# Patient Record
Sex: Male | Born: 1940 | Race: White | Hispanic: No | Marital: Married | Smoking: Former smoker
Health system: Southern US, Community
[De-identification: ages and names within clinical notes are randomized; demographics above are authoritative.]

## PROBLEM LIST (undated history)

## (undated) DIAGNOSIS — I10 Essential (primary) hypertension: Secondary | ICD-10-CM

## (undated) DIAGNOSIS — N179 Acute kidney failure, unspecified: Secondary | ICD-10-CM

## (undated) HISTORY — PX: TONSILLECTOMY: SUR1361

## (undated) SURGERY — COLONOSCOPY
Anesthesia: Moderate Sedation

---

## 2012-02-20 DIAGNOSIS — R112 Nausea with vomiting, unspecified: Secondary | ICD-10-CM

## 2014-02-15 ENCOUNTER — Emergency Department (HOSPITAL_COMMUNITY): Payer: Medicare Other

## 2014-02-15 ENCOUNTER — Encounter (HOSPITAL_COMMUNITY): Payer: Self-pay | Admitting: Emergency Medicine

## 2014-02-15 ENCOUNTER — Inpatient Hospital Stay (HOSPITAL_COMMUNITY)
Admission: EM | Admit: 2014-02-15 | Discharge: 2014-03-09 | DRG: 441 | Disposition: A | Discharge status: Skilled Nursing Facility | Payer: Medicare Other | Attending: Internal Medicine | Admitting: Internal Medicine

## 2014-02-15 DIAGNOSIS — R350 Frequency of micturition: Secondary | ICD-10-CM | POA: Diagnosis present

## 2014-02-15 DIAGNOSIS — N184 Chronic kidney disease, stage 4 (severe): Secondary | ICD-10-CM | POA: Diagnosis present

## 2014-02-15 DIAGNOSIS — R001 Bradycardia, unspecified: Secondary | ICD-10-CM | POA: Diagnosis not present

## 2014-02-15 DIAGNOSIS — Z79899 Other long term (current) drug therapy: Secondary | ICD-10-CM | POA: Diagnosis not present

## 2014-02-15 DIAGNOSIS — N281 Cyst of kidney, acquired: Secondary | ICD-10-CM | POA: Diagnosis present

## 2014-02-15 DIAGNOSIS — E43 Unspecified severe protein-calorie malnutrition: Secondary | ICD-10-CM

## 2014-02-15 DIAGNOSIS — D638 Anemia in other chronic diseases classified elsewhere: Secondary | ICD-10-CM | POA: Diagnosis present

## 2014-02-15 DIAGNOSIS — E869 Volume depletion, unspecified: Secondary | ICD-10-CM | POA: Diagnosis present

## 2014-02-15 DIAGNOSIS — C189 Malignant neoplasm of colon, unspecified: Secondary | ICD-10-CM

## 2014-02-15 DIAGNOSIS — I129 Hypertensive chronic kidney disease with stage 1 through stage 4 chronic kidney disease, or unspecified chronic kidney disease: Secondary | ICD-10-CM | POA: Diagnosis present

## 2014-02-15 DIAGNOSIS — R300 Dysuria: Secondary | ICD-10-CM | POA: Diagnosis present

## 2014-02-15 DIAGNOSIS — Z87891 Personal history of nicotine dependence: Secondary | ICD-10-CM

## 2014-02-15 DIAGNOSIS — Z6824 Body mass index (BMI) 24.0-24.9, adult: Secondary | ICD-10-CM | POA: Diagnosis not present

## 2014-02-15 DIAGNOSIS — K8020 Calculus of gallbladder without cholecystitis without obstruction: Secondary | ICD-10-CM | POA: Diagnosis present

## 2014-02-15 DIAGNOSIS — N400 Enlarged prostate without lower urinary tract symptoms: Secondary | ICD-10-CM | POA: Diagnosis present

## 2014-02-15 DIAGNOSIS — N401 Enlarged prostate with lower urinary tract symptoms: Secondary | ICD-10-CM | POA: Diagnosis present

## 2014-02-15 DIAGNOSIS — K6389 Other specified diseases of intestine: Secondary | ICD-10-CM | POA: Diagnosis present

## 2014-02-15 DIAGNOSIS — K921 Melena: Secondary | ICD-10-CM | POA: Diagnosis present

## 2014-02-15 DIAGNOSIS — G9340 Encephalopathy, unspecified: Secondary | ICD-10-CM | POA: Diagnosis present

## 2014-02-15 DIAGNOSIS — R319 Hematuria, unspecified: Secondary | ICD-10-CM | POA: Diagnosis not present

## 2014-02-15 DIAGNOSIS — R627 Adult failure to thrive: Secondary | ICD-10-CM | POA: Diagnosis present

## 2014-02-15 DIAGNOSIS — I10 Essential (primary) hypertension: Secondary | ICD-10-CM | POA: Diagnosis present

## 2014-02-15 DIAGNOSIS — N179 Acute kidney failure, unspecified: Secondary | ICD-10-CM | POA: Diagnosis present

## 2014-02-15 DIAGNOSIS — K7200 Acute and subacute hepatic failure without coma: Secondary | ICD-10-CM

## 2014-02-15 DIAGNOSIS — D509 Iron deficiency anemia, unspecified: Secondary | ICD-10-CM | POA: Diagnosis present

## 2014-02-15 DIAGNOSIS — K750 Abscess of liver: Principal | ICD-10-CM | POA: Diagnosis present

## 2014-02-15 DIAGNOSIS — D649 Anemia, unspecified: Secondary | ICD-10-CM | POA: Diagnosis present

## 2014-02-15 DIAGNOSIS — K769 Liver disease, unspecified: Secondary | ICD-10-CM

## 2014-02-15 DIAGNOSIS — R410 Disorientation, unspecified: Secondary | ICD-10-CM

## 2014-02-15 DIAGNOSIS — R110 Nausea: Secondary | ICD-10-CM

## 2014-02-15 DIAGNOSIS — R5381 Other malaise: Secondary | ICD-10-CM

## 2014-02-15 HISTORY — DX: Essential (primary) hypertension: I10

## 2014-02-15 HISTORY — DX: Acute kidney failure, unspecified: N17.9

## 2014-02-15 LAB — PROTIME-INR
INR: 1.24 (ref 0.00–1.49)
INR: 1.18 (ref 0.00–1.49)
PROTHROMBIN TIME: 15.6 s — AB (ref 11.6–15.2)
Prothrombin Time: 15 seconds (ref 11.6–15.2)

## 2014-02-15 LAB — CBC WITH DIFFERENTIAL/PLATELET
BASOS ABS: 0 10*3/uL (ref 0.0–0.1)
Basophils Relative: 0 % (ref 0–1)
EOS ABS: 0.1 10*3/uL (ref 0.0–0.7)
EOS PCT: 0 % (ref 0–5)
HEMATOCRIT: 26.1 % — AB (ref 39.0–52.0)
HEMOGLOBIN: 8.8 g/dL — AB (ref 13.0–17.0)
Lymphocytes Relative: 9 % — ABNORMAL LOW (ref 12–46)
Lymphs Abs: 1.7 10*3/uL (ref 0.7–4.0)
MCH: 26.1 pg (ref 26.0–34.0)
MCHC: 33.7 g/dL (ref 30.0–36.0)
MCV: 77.4 fL — ABNORMAL LOW (ref 78.0–100.0)
MONO ABS: 1 10*3/uL (ref 0.1–1.0)
MONOS PCT: 6 % (ref 3–12)
NEUTROS ABS: 15.1 10*3/uL — AB (ref 1.7–7.7)
Neutrophils Relative %: 85 % — ABNORMAL HIGH (ref 43–77)
Platelets: 302 10*3/uL (ref 150–400)
RBC: 3.37 MIL/uL — ABNORMAL LOW (ref 4.22–5.81)
RDW: 14.7 % (ref 11.5–15.5)
WBC: 17.8 10*3/uL — ABNORMAL HIGH (ref 4.0–10.5)

## 2014-02-15 LAB — COMPREHENSIVE METABOLIC PANEL
ALBUMIN: 2.1 g/dL — AB (ref 3.5–5.2)
ALT: 31 U/L (ref 0–53)
ANION GAP: 14 (ref 5–15)
AST: 42 U/L — AB (ref 0–37)
Alkaline Phosphatase: 209 U/L — ABNORMAL HIGH (ref 39–117)
BILIRUBIN TOTAL: 0.7 mg/dL (ref 0.3–1.2)
BUN: 18 mg/dL (ref 6–23)
CALCIUM: 8.7 mg/dL (ref 8.4–10.5)
CHLORIDE: 95 meq/L — AB (ref 96–112)
CO2: 26 mEq/L (ref 19–32)
Creatinine, Ser: 1.09 mg/dL (ref 0.50–1.35)
GFR calc Af Amer: 75 mL/min — ABNORMAL LOW (ref >90)
GFR calc non Af Amer: 65 mL/min — ABNORMAL LOW (ref >90)
Glucose, Bld: 104 mg/dL — ABNORMAL HIGH (ref 70–99)
Potassium: 3.3 mEq/L — ABNORMAL LOW (ref 3.7–5.3)
Sodium: 135 mEq/L — ABNORMAL LOW (ref 137–147)
TOTAL PROTEIN: 6.3 g/dL (ref 6.0–8.3)

## 2014-02-15 LAB — APTT: aPTT: 35 seconds (ref 24–37)

## 2014-02-15 MED ORDER — POTASSIUM CHLORIDE IN NACL 20-0.9 MEQ/L-% IV SOLN
INTRAVENOUS | Status: AC
  Administered 2014-02-15: 16:00:00 via INTRAVENOUS
  Filled 2014-02-15 (×2): qty 1000

## 2014-02-15 MED ORDER — GUAIFENESIN-DM 100-10 MG/5ML PO SYRP: 5.0000 mL | ORAL_SOLUTION | ORAL | Status: DC | PRN

## 2014-02-15 MED ORDER — POLYETHYLENE GLYCOL 3350 17 G PO PACK
17.0000 g | PACK | Freq: Every day | ORAL | Status: DC | PRN
  Filled 2014-02-15: qty 1

## 2014-02-15 MED ORDER — INFLUENZA VAC SPLIT QUAD 0.5 ML IM SUSY
0.5000 mL | PREFILLED_SYRINGE | INTRAMUSCULAR | Status: DC
  Filled 2014-02-15: qty 0.5

## 2014-02-15 MED ORDER — HEPARIN SODIUM (PORCINE) 5000 UNIT/ML IJ SOLN
5000.0000 [IU] | Freq: Three times a day (TID) | INTRAMUSCULAR | Status: DC
  Administered 2014-02-15 – 2014-02-16 (×2): 5000 [IU] via SUBCUTANEOUS
  Filled 2014-02-15 (×3): qty 1

## 2014-02-15 MED ORDER — MORPHINE SULFATE 2 MG/ML IJ SOLN
2.0000 mg | INTRAMUSCULAR | Status: DC | PRN
  Administered 2014-02-16 (×2): 2 mg via INTRAVENOUS
  Filled 2014-02-15 (×2): qty 1

## 2014-02-15 MED ORDER — PNEUMOCOCCAL VAC POLYVALENT 25 MCG/0.5ML IJ INJ
0.5000 mL | INJECTION | INTRAMUSCULAR | Status: DC
  Filled 2014-02-15: qty 0.5

## 2014-02-15 MED ORDER — TERAZOSIN HCL 5 MG PO CAPS
5.0000 mg | ORAL_CAPSULE | Freq: Every day | ORAL | Status: DC
  Administered 2014-02-15 – 2014-03-08 (×22): 5 mg via ORAL
  Filled 2014-02-15 (×22): qty 1

## 2014-02-15 MED ORDER — ONDANSETRON HCL 4 MG/2ML IJ SOLN
4.0000 mg | Freq: Four times a day (QID) | INTRAMUSCULAR | Status: DC | PRN
  Administered 2014-02-25: 4 mg via INTRAVENOUS
  Filled 2014-02-15: qty 2

## 2014-02-15 MED ORDER — PIPERACILLIN-TAZOBACTAM 3.375 G IVPB 30 MIN
3.3750 g | Freq: Once | INTRAVENOUS | Status: AC
  Administered 2014-02-15: 3.375 g via INTRAVENOUS
  Filled 2014-02-15 (×2): qty 50

## 2014-02-15 MED ORDER — HYDROCODONE-ACETAMINOPHEN 5-325 MG PO TABS
1.0000 | ORAL_TABLET | ORAL | Status: DC | PRN
  Administered 2014-02-15 – 2014-02-18 (×6): 2 via ORAL
  Administered 2014-02-19 (×2): 1 via ORAL
  Administered 2014-02-19: 2 via ORAL
  Filled 2014-02-15 (×3): qty 2
  Filled 2014-02-15: qty 1
  Filled 2014-02-15 (×5): qty 2

## 2014-02-15 MED ORDER — POTASSIUM CHLORIDE CRYS ER 20 MEQ PO TBCR
40.0000 meq | EXTENDED_RELEASE_TABLET | Freq: Once | ORAL | Status: AC
  Administered 2014-02-15: 40 meq via ORAL
  Filled 2014-02-15: qty 2

## 2014-02-15 MED ORDER — MORPHINE SULFATE 2 MG/ML IJ SOLN
2.0000 mg | INTRAMUSCULAR | Status: DC | PRN
  Administered 2014-02-15 (×2): 2 mg via INTRAVENOUS
  Filled 2014-02-15 (×2): qty 1

## 2014-02-15 MED ORDER — FENTANYL CITRATE 0.05 MG/ML IJ SOLN
100.0000 ug | Freq: Once | INTRAMUSCULAR | Status: AC
  Administered 2014-02-15: 100 ug via INTRAVENOUS
  Filled 2014-02-15: qty 2

## 2014-02-15 MED ORDER — VANCOMYCIN HCL IN DEXTROSE 1-5 GM/200ML-% IV SOLN
1000.0000 mg | Freq: Two times a day (BID) | INTRAVENOUS | Status: DC
Start: 2014-02-15 — End: 2014-02-18
  Administered 2014-02-15 – 2014-02-18 (×6): 1000 mg via INTRAVENOUS
  Filled 2014-02-15 (×7): qty 200

## 2014-02-15 MED ORDER — PIPERACILLIN-TAZOBACTAM 3.375 G IVPB
3.3750 g | Freq: Three times a day (TID) | INTRAVENOUS | Status: DC
  Administered 2014-02-15 – 2014-02-26 (×32): 3.375 g via INTRAVENOUS
  Filled 2014-02-15 (×36): qty 50

## 2014-02-15 MED ORDER — ONDANSETRON HCL 4 MG PO TABS: 4.0000 mg | ORAL_TABLET | Freq: Four times a day (QID) | ORAL | Status: DC | PRN

## 2014-02-15 NOTE — ED Provider Notes (Signed)
CSN: [id number]     Arrival date & time 02/15/14  1138 History   First MD Initiated Contact with Patient 02/15/14 1227     Chief Complaint  Patient presents with  . Urinary Frequency  . Generalized Body Aches     (Consider location/radiation/quality/duration/timing/severity/associated sxs/prior Treatment) Patient is a 73 y.o. male presenting with frequency. The history is provided by the patient.  Urinary Frequency Associated symptoms include abdominal pain. Pertinent negatives include no chest pain, no headaches and no shortness of breath.   patient was sent to the hospital after MRI results. He's had fevers and chills and weakness along with upper abdominal pain. He started out with URI symptoms such as runny nose and cough around a week ago. He saw his PCP and then was transferred to [facility] after an elevated white count. Had been on antibiotics and then had ultrasound which some nonspecific findings. Urinalysis showed possible infection, however cultures were negative. Blood cultures are negative. Patient had an outpatient MRI done to evaluate the liver and shows liver abscess versus metastatic liver cancer with a colonic mass. His continued to be weak. His pain in his abdomen. Fevers up to 102 at home. He had been on Levaquin and initially vancomycin at [facility] Past Medical History  Diagnosis Date  . Hypertension   . Kidney stone    Past Surgical History  Procedure Laterality Date  . Tonsillectomy     No family history on file. History  Substance Use Topics  . Smoking status: Former Research scientist (life sciences)  . Smokeless tobacco: Not on file  . Alcohol Use: No    Review of Systems  Constitutional: Positive for fever, chills and fatigue. Negative for activity change and appetite change.  HENT: Positive for sore throat.   Eyes: Negative for pain.  Respiratory: Negative for chest tightness and shortness of breath.   Cardiovascular: Negative for chest pain and leg swelling.   Gastrointestinal: Positive for abdominal pain. Negative for nausea, vomiting and diarrhea.  Genitourinary: Positive for frequency. Negative for flank pain.  Musculoskeletal: Negative for back pain and neck stiffness.  Skin: Negative for rash.  Neurological: Negative for weakness, numbness and headaches.  Psychiatric/Behavioral: Negative for behavioral problems.      Allergies  Review of patient's allergies indicates no known allergies.  Home Medications   Prior to Admission medications   Medication Sig Start Date End Date Taking? Authorizing Provider  hydrochlorothiazide (HYDRODIURIL) 25 MG tablet Take 25 mg by mouth daily.   Yes Historical Provider, MD  potassium chloride SA (K-DUR,KLOR-CON) 20 MEQ tablet Take 20 mEq by mouth daily.  02/09/14  Yes Historical Provider, MD  ramipril (ALTACE) 10 MG capsule Take 10 mg by mouth daily.  01/29/14  Yes Historical Provider, MD  terazosin (HYTRIN) 5 MG capsule Take 5 mg by mouth daily.  01/29/14  Yes Historical Provider, MD   BP 123/59  Pulse 65  Temp(Src) 98.7 F (37.1 C) (Oral)  Resp 21  Ht 6' (1.829 m)  Wt 185 lb (83.915 kg)  BMI 25.08 kg/m2  SpO2 91% Physical Exam  Constitutional: He is oriented to person, place, and time. He appears well-developed and well-nourished.  HENT:  Head: Normocephalic.  Eyes: Pupils are equal, round, and reactive to light. Scleral icterus is present.  Cardiovascular: Normal rate and regular rhythm.   Pulmonary/Chest: Effort normal.  Abdominal: There is tenderness.  Mild distention. Tenderness right upper quadrant with some fullness.  Musculoskeletal: He exhibits edema.  Mild bilateral low lower strandy pitting  edema  Neurological: He is alert and oriented to person, place, and time.    ED Course  Procedures (including critical care time) Labs Review Labs Reviewed  CBC WITH DIFFERENTIAL - Abnormal; Notable for the following:    WBC 17.8 (*)    RBC 3.37 (*)    Hemoglobin 8.8 (*)    HCT 26.1 (*)     MCV 77.4 (*)    Neutrophils Relative % 85 (*)    Neutro Abs 15.1 (*)    Lymphocytes Relative 9 (*)    All other components within normal limits  COMPREHENSIVE METABOLIC PANEL - Abnormal; Notable for the following:    Sodium 135 (*)    Potassium 3.3 (*)    Chloride 95 (*)    Glucose, Bld 104 (*)    Albumin 2.1 (*)    AST 42 (*)    Alkaline Phosphatase 209 (*)    GFR calc non Af Amer 65 (*)    GFR calc Af Amer 75 (*)    All other components within normal limits  PROTIME-INR - Abnormal; Notable for the following:    Prothrombin Time 15.6 (*)    All other components within normal limits  CULTURE, BLOOD (ROUTINE X 2)  CULTURE, BLOOD (ROUTINE X 2)  CULTURE, EXPECTORATED SPUTUM-ASSESSMENT  GRAM STAIN  BODY FLUID CULTURE  APTT  PROTIME-INR  BODY FLUID CELL COUNT WITH DIFFERENTIAL  CANCER ANTIGEN 19-9  CEA  CYTOLOGY - NON PAP    Imaging Review Dg Chest 2 View  02/15/2014   CLINICAL DATA:  Fever and fatigue, and possible liver abscess ; also body aches and urinary frequency and right lower quadrant abdominal pain for 1 month.  EXAM: CHEST  2 VIEW  COMPARISON:  PA and lateral chest x-ray of February 09, 2014 and February 06, 2014.  FINDINGS: The lungs are adequately inflated. The interstitial markings are mildly increased. There is a new tiny left pleural effusion. The cardiac silhouette is normal in size. The pulmonary vascularity is mildly prominent. There is persistent increased density in the retrocardiac region likely on the right which may reflect developing pneumonia. There is no pneumothorax. The mediastinum is normal in width. The bony thorax is unremarkable.  IMPRESSION: Mildly increased interstitial markings diffusely, and focally increased lung markings likely in the right lower lobe are noted. One cannot exclude early pneumonia. A small left pleural effusion has developed.   Electronically Signed   By: David  Martinique   On: 02/15/2014 13:02     EKG Interpretation None       MDM   Final diagnoses:  Liver, massive necrosis  BPH (benign prostatic hyperplasia)  Colonic mass  Essential hypertension  Liver abscess    Patient with fevers of unknown origin, liver mass and colonic mass. Question of liver abscess versus infected necrotic metastatic tumor. Had outpatient MRI. Will admit to internal medicine. General surgery has also been notified    Jasper Riling. Alvino Chapel, MD 02/15/14 1610

## 2014-02-15 NOTE — ED Notes (Signed)
Phlebotomy at bedside.

## 2014-02-15 NOTE — ED Notes (Signed)
General surgery at bedside. 

## 2014-02-15 NOTE — Consult Note (Signed)
Seen and agree  

## 2014-02-15 NOTE — ED Notes (Addendum)
Hospitalist at bedside 

## 2014-02-15 NOTE — H&P (Signed)
Patient Demographics  Brent Little, is a 73 y.o. male  MRN: [id number]   DOB - [birth year]  Admit Date - 02/15/2014  Outpatient Primary MD for the patient is [name], MD   With History of -  Past Medical History  Diagnosis Date  . Hypertension   . Kidney stone       Past Surgical History  Procedure Laterality Date  . Tonsillectomy      in for   Chief Complaint  Patient presents with  . Urinary Frequency  . Generalized Body Aches     HPI  Brent Little  is a 73 y.o. male,  With H/O HTN, Kidney stone, who initially presented to [place] about one week ago with chief complaints of fever, dull right upper quadrant pain and a dry cough. He was diagnosed there with a possible community-acquired pneumonia, was placed on Levaquin, he had marginal improvement. During his fever workup he had a right upper quadrant ultrasound which showed some nonspecific changes . He was then discharged home with outpatient MRI, MRI done in the outpatient setting showed a septated cystic lesion in the right hepatic lobe concerning for abscess versus metastatic necrotic mass. MRI also showed an ascending colon circumferential mass.   He continued to have fever chills, decreased appetite and lethargy for which he presented to on ER, house call to admit the patient for workup of his necrotic liver mass, colonic mass and possible chest x-ray infiltrate.    Review of Systems    In addition to the HPI above,   + Fever-chills, No Headache, No changes with Vision or hearing, No problems swallowing food or Liquids, No Chest pain, +ve dry Cough, no Shortness of Breath, +ve RUQ Abdominal pain, No Nausea or Vommitting, Bowel movements are regular, No Blood in stool or Urine, No dysuria, No new skin rashes or bruises, No new  joints pains-aches,  No new weakness, tingling, numbness in any extremity, No recent weight gain or loss, No polyuria, polydypsia or polyphagia, No significant Mental Stressors.  A full 10 point Review of Systems was done, except as stated above, all other Review of Systems were negative.   Social History History  Substance Use Topics  . Smoking status: Former Research scientist (life sciences)  . Smokeless tobacco: Not on file  . Alcohol Use: No      Family History No colon or Liver cancer in the family  Prior to Admission medications   Medication Sig Start Date End Date Taking? Authorizing Provider  hydrochlorothiazide (HYDRODIURIL) 25 MG tablet Take 25 mg by mouth daily.   Yes Historical Provider, MD  potassium chloride SA (K-DUR,KLOR-CON) 20 MEQ tablet Take 20 mEq by mouth daily.  02/09/14  Yes Historical Provider, MD  ramipril (ALTACE) 10 MG capsule Take 10 mg by mouth daily.  01/29/14  Yes Historical Provider, MD  terazosin (HYTRIN) 5 MG capsule Take 5 mg by mouth daily.  01/29/14  Yes Historical Provider, MD  No Known Allergies  Physical Exam  Vitals  Blood pressure 108/49, pulse 63, temperature 98.7 F (37.1 C), temperature source Oral, resp. rate 18, height 6' (1.829 m), weight 83.915 kg (185 lb), SpO2 97.00%.      2. Normal affect and insight, Not Suicidal or Homicidal, Awake Alert, Oriented X 3.  3. No F.N deficits, ALL C.Nerves Intact, Strength 5/5 all 4 extremities, Sensation intact all 4 extremities, Plantars down going.  4. Ears and Eyes appear Normal, Conjunctivae clear, PERRLA. Moist Oral Mucosa.  5. Supple Neck, No JVD, No cervical lymphadenopathy appriciated, No Carotid Bruits.  6. Symmetrical Chest wall movement, Good air movement bilaterally, CTAB.  7. RRR, No Gallops, Rubs or Murmurs, No Parasternal Heave.  8. Positive Bowel Sounds, Abdomen Soft, +ve RUQ tenderness, No organomegaly appriciated,No rebound -guarding or rigidity.  9.  No Cyanosis, Normal Skin Turgor, No  Skin Rash or Bruise.  10. Good muscle tone,  joints appear normal , no effusions, Normal ROM.  11. No Palpable Lymph Nodes in Neck or Axillae     Data Review  CBC  Recent Labs Lab 02/15/14 1243  WBC 17.8*  HGB 8.8*  HCT 26.1*  PLT 302  MCV 77.4*  MCH 26.1  MCHC 33.7  RDW 14.7  LYMPHSABS 1.7  MONOABS 1.0  EOSABS 0.1  BASOSABS 0.0   ------------------------------------------------------------------------------------------------------------------  Chemistries   Recent Labs Lab 02/15/14 1243  NA 135*  K 3.3*  CL 95*  CO2 26  GLUCOSE 104*  BUN 18  CREATININE 1.09  CALCIUM 8.7  AST 42*  ALT 31  ALKPHOS 209*  BILITOT 0.7   ------------------------------------------------------------------------------------------------------------------ estimated creatinine clearance is 65.3 ml/min (by C-G formula based on Cr of 1.09). ------------------------------------------------------------------------------------------------------------------ No results found for this basename: TSH, T4TOTAL, FREET3, T3FREE, THYROIDAB,  in the last 72 hours   Coagulation profile  Recent Labs Lab 02/15/14 1243  INR 1.24   ------------------------------------------------------------------------------------------------------------------- No results found for this basename: DDIMER,  in the last 72 hours -------------------------------------------------------------------------------------------------------------------  Cardiac Enzymes No results found for this basename: CK, CKMB, TROPONINI, MYOGLOBIN,  in the last 168 hours ------------------------------------------------------------------------------------------------------------------ No components found with this basename: POCBNP,    ---------------------------------------------------------------------------------------------------------------  Urinalysis No results found for this basename: colorurine,  appearanceur,  labspec,   phurine,  glucoseu,  hgbur,  bilirubinur,  ketonesur,  proteinur,  urobilinogen,  nitrite,  leukocytesur    ----------------------------------------------------------------------------------------------------------------  Imaging results:   Dg Chest 2 View  02/15/2014   CLINICAL DATA:  Fever and fatigue, and possible liver abscess ; also body aches and urinary frequency and right lower quadrant abdominal pain for 1 month.  EXAM: CHEST  2 VIEW  COMPARISON:  PA and lateral chest x-ray of February 09, 2014 and February 06, 2014.  FINDINGS: The lungs are adequately inflated. The interstitial markings are mildly increased. There is a new tiny left pleural effusion. The cardiac silhouette is normal in size. The pulmonary vascularity is mildly prominent. There is persistent increased density in the retrocardiac region likely on the right which may reflect developing pneumonia. There is no pneumothorax. The mediastinum is normal in width. The bony thorax is unremarkable.  IMPRESSION: Mildly increased interstitial markings diffusely, and focally increased lung markings likely in the right lower lobe are noted. One cannot exclude early pneumonia. A small left pleural effusion has developed.   Electronically Signed   By: David  Martinique   On: 02/15/2014 13:02         Assessment & Plan   1.Liver Mass vs  Abscess - there is concern for colonic mass with metastases to the liver and subsequent necrosis, however infection cannot be ruled out, will be admitted. We'll order blood cultures. Have requested IR to biopsy the lesion. Have ordered cytology-cell count-Gram stain and culture from the aspirate. For now Zosyn. Have requested GI Dr. Paulita Fujita and general surgery to see the patient as well. Have ordered CEA and CA 19.9 also. Note patient has never had colonoscopy before.    2. Low potassium. Replace and recheck.     3. Infiltrate on chest x-ray. This likely is due to pleuritic pain due to #1 above preventing  him from taking deep breaths causing atelectasis. He does not have a productive cough. For now we'll add vancomycin as he was recently admitted for a possible pneumonia. Once cultures are out we can stop vancomycin. I do not think patient has active pneumonia clinically.     4. Elevated alkaline phosphatase. #1 above. We'll defer to GI further workup.    DVT Prophylaxis Heparin    AM Labs Ordered, also please review Full Orders  Family Communication: Admission, patients condition and plan of care including tests being ordered have been discussed with the patient and family who indicate understanding and agree with the plan and Code Status.  Code Status Full  Likely DC to  Home  Condition GUARDED     Time spent in minutes : 35    Kasheena Sambrano K M.D on 02/15/2014 at 3:28 PM  Between 7am to 7pm - Pager - [phone]  After 7pm go to [email or web address] - password [email or web address]  And look for the night coverage person covering me after hours  [facility] Office  [phone]

## 2014-02-15 NOTE — ED Notes (Signed)
Sputum collection device placed in room/labelled, explained to pt.

## 2014-02-15 NOTE — ED Notes (Signed)
Attempted to call report x 1  

## 2014-02-15 NOTE — ED Notes (Signed)
Attempted report x 2 

## 2014-02-15 NOTE — ED Notes (Signed)
Attempted blood draw on pt, unsuccessful. Phlebotomist notified

## 2014-02-15 NOTE — ED Notes (Signed)
Dr Pickering at bedside 

## 2014-02-15 NOTE — Consult Note (Signed)
ANTIBIOTIC CONSULT NOTE - INITIAL  Pharmacy Consult for Vancomycin and Zosyn Indication: rule out sepsis  No Known Allergies  Patient Measurements: Height: 6' (182.9 cm) Weight: 185 lb (83.915 kg) IBW/kg (Calculated) : 77.6  Vital Signs: Temp: 98.7 F (37.1 C) (10/08 1154) Temp Source: Oral (10/08 1154) BP: 106/50 mmHg (10/08 1441) Pulse Rate: 59 (10/08 1441) Intake/Output from previous day:   Intake/Output from this shift:    Labs:  Recent Labs  02/15/14 1243  WBC 17.8*  HGB 8.8*  PLT 302  CREATININE 1.09   Estimated Creatinine Clearance: 65.3 ml/min (by C-G formula based on Cr of 1.09).  Microbiology: No results found for this or any previous visit (from the past 720 hour(s)).  Medical History: Past Medical History  Diagnosis Date  . Hypertension   . Kidney stone    Assessment: 53yom who had an MRI Tuesday which showed possible liver abscess. Presents to the ED today with fever, body aches, and abdominal pain. CXR cannot exclude early pneumonia. He will begin vancomycin and zosyn. Renal function wnl.  Goal of Therapy:  Vancomycin trough level 15-20 mcg/ml  Plan:  1) Vancomycin 1g IV q12 2) Zosyn 3.375g IV q8 (4 hour infusion) 3) Follow renal function, cultures, LOT, level if needed  Deboraha Sprang 02/15/2014,3:11 PM

## 2014-02-15 NOTE — ED Notes (Signed)
Pt with wife, reports had MRI on Tuesday, showed possible liver abscess. Has been having urinary frequency, body aches, abdominal pain. No n/v/d. Has been running a fever. Reports he just feels tired. Pt wife has records from recent admission in [place].

## 2014-02-15 NOTE — Consult Note (Signed)
[facility] Gastroenterology Consultation Note  Referring Provider: Dr. Lala Lund [facility]) Primary Care Physician:  [name], MD  Reason for Consultation:  Fevers, abdominal pain, liver lesion, ascending colon lesion  HPI: Brent Little is a 73 y.o. male whom we've been asked to see for the above complaints.  He was in his static state of health until about one week ago.  At that time, he began having fevers and URI-type symptoms.  A few days after that, he developed progressive right upper quadrant abdominal pain as well as nausea and vomiting.  Pain is constant, no clear association with eating, no improvement with outpatient therapies, was admitted to hospital in [facility] for a while, and then discharged home.  Ultimately, he underwent a series of studies, which showed liver lesion and an ascending colon lesion.  He denies change in bowel habits, blood in stool.  He has not had a colonoscopy.  There is no known family history of liver cancer, liver disease, or colon cancer. He has lost about 10 lbs over the past couple weeks.   Past Medical History  Diagnosis Date  . Hypertension     Past Surgical History  Procedure Laterality Date  . Tonsillectomy      Prior to Admission medications   Medication Sig Start Date End Date Taking? Authorizing Provider  hydrochlorothiazide (HYDRODIURIL) 25 MG tablet Take 25 mg by mouth daily.   Yes Historical Provider, MD  potassium chloride SA (K-DUR,KLOR-CON) 20 MEQ tablet Take 20 mEq by mouth daily.  02/09/14  Yes Historical Provider, MD  ramipril (ALTACE) 10 MG capsule Take 10 mg by mouth daily.  01/29/14  Yes Historical Provider, MD  terazosin (HYTRIN) 5 MG capsule Take 5 mg by mouth daily.  01/29/14  Yes Historical Provider, MD    Current Facility-Administered Medications  Medication Dose Route Frequency Provider Last Rate Last Dose  . 0.9 % NaCl with KCl 20 mEq/ L  infusion   Intravenous Continuous Thurnell Lose, MD 75 mL/hr at 02/15/14 1530    .  guaiFENesin-dextromethorphan (ROBITUSSIN DM) 100-10 MG/5ML syrup 5 mL  5 mL Oral Q4H PRN Thurnell Lose, MD      . heparin injection 5,000 Units  5,000 Units Subcutaneous 3 times per day Thurnell Lose, MD      . HYDROcodone-acetaminophen (NORCO/VICODIN) 5-325 MG per tablet 1-2 tablet  1-2 tablet Oral Q4H PRN Thurnell Lose, MD      . morphine 2 MG/ML injection 2 mg  2 mg Intravenous Q4H PRN Thurnell Lose, MD   2 mg at 02/15/14 1708  . ondansetron (ZOFRAN) tablet 4 mg  4 mg Oral Q6H PRN Thurnell Lose, MD       Or  . ondansetron (ZOFRAN) injection 4 mg  4 mg Intravenous Q6H PRN Thurnell Lose, MD      . piperacillin-tazobactam (ZOSYN) IVPB 3.375 g  3.375 g Intravenous Once Benjamine Sprague [place], [facility]      . [START ON 02/16/2014] piperacillin-tazobactam (ZOSYN) IVPB 3.375 g  3.375 g Intravenous 3 times per day Deboraha Sprang, RPH      . polyethylene glycol (MIRALAX / GLYCOLAX) packet 17 g  17 g Oral Daily PRN Thurnell Lose, MD      . terazosin (HYTRIN) capsule 5 mg  5 mg Oral Daily Thurnell Lose, MD      . vancomycin (VANCOCIN) IVPB 1000 mg/200 mL premix  1,000 mg Intravenous Q12H Deboraha Sprang, RPH 200 mL/hr at  02/15/14 1625 1,000 mg at 02/15/14 1625    Allergies as of 02/15/2014  . (No Known Allergies)    History reviewed. No pertinent family history.  History   Social History  . Marital Status: Married    Spouse Name: N/A    Number of Children: N/A  . Years of Education: N/A   Occupational History  . Not on file.   Social History Main Topics  . Smoking status: Former Research scientist (life sciences)  . Smokeless tobacco: Not on file  . Alcohol Use: No  . Drug Use: Not on file  . Sexual Activity: Not on file   Other Topics Concern  . Not on file   Social History Narrative  . No narrative on file    Review of Systems: ROS Dr. Candiss Norse 02/15/14 reviewed and I agree  Physical Exam: Vital signs in last 24 hours: Temp:  [98 F (36.7 C)-99 F (37.2 C)] 99 F (37.2 C) (10/08  1700) Pulse Rate:  [59-98] 67 (10/08 1700) Resp:  [14-25] 20 (10/08 1700) BP: (89-130)/(47-79) 130/53 mmHg (10/08 1700) SpO2:  [90 %-98 %] 92 % (10/08 1700) Weight:  [83.915 kg (185 lb)] 83.915 kg (185 lb) (10/08 1154)   General:   Alert,  Well-developed, slightly uncomfortable-appearing, but is not acutely toxic-appearing Head:  Normocephalic and atraumatic. Eyes:  Sclera clear, no icterus.   Conjunctiva pale Ears:  Normal auditory acuity. Nose:  No deformity, discharge,  or lesions. Mouth:  No deformity or lesions.  Oropharynx pale and dry Neck:  Supple; no masses or thyromegaly. Lungs:  Clear throughout to auscultation.   No wheezes, crackles, or rhonchi. No acute distress. Heart:  Regular rate and rhythm; no murmurs, clicks, rubs,  or gallops. Abdomen:  Tender right hemi-abdomen, with voluntary guarding. No masses or hernias noted. Normal bowel sounds, No peritonitis.  Liver is firm and enlarged, several cm below the right costal margin along the mid-clavicular line Msk:  Symmetrical without gross deformities. Normal posture. Pulses:  Normal pulses noted. Extremities:  Without clubbing or edema. Neurologic:  Alert and  oriented x4; diffusely weak, otherwise grossly normal neurologically. Skin:  Sallow and pale-appearing, otherwise intact without significant lesions or rashes. Psych:  Alert and cooperative. Normal mood and affect.   Lab Results:  Recent Labs  02/15/14 1243  WBC 17.8*  HGB 8.8*  HCT 26.1*  PLT 302   BMET  Recent Labs  02/15/14 1243  NA 135*  K 3.3*  CL 95*  CO2 26  GLUCOSE 104*  BUN 18  CREATININE 1.09  CALCIUM 8.7   LFT  Recent Labs  02/15/14 1243  PROT 6.3  ALBUMIN 2.1*  AST 42*  ALT 31  ALKPHOS 209*  BILITOT 0.7   PT/INR  Recent Labs  02/15/14 1243 02/15/14 1539  LABPROT 15.6* 15.0  INR 1.24 1.18    Studies/Results: Dg Chest 2 View  02/15/2014   CLINICAL DATA:  Fever and fatigue, and possible liver abscess ; also body  aches and urinary frequency and right lower quadrant abdominal pain for 1 month.  EXAM: CHEST  2 VIEW  COMPARISON:  PA and lateral chest x-ray of February 09, 2014 and February 06, 2014.  FINDINGS: The lungs are adequately inflated. The interstitial markings are mildly increased. There is a new tiny left pleural effusion. The cardiac silhouette is normal in size. The pulmonary vascularity is mildly prominent. There is persistent increased density in the retrocardiac region likely on the right which may reflect developing pneumonia. There is no  pneumothorax. The mediastinum is normal in width. The bony thorax is unremarkable.  IMPRESSION: Mildly increased interstitial markings diffusely, and focally increased lung markings likely in the right lower lobe are noted. One cannot exclude early pneumonia. A small left pleural effusion has developed.   Electronically Signed   By: David  Martinique   On: 02/15/2014 13:02   Impression:  1.  Abdominal pain, right upper quadrant, highly likely due to underlying liver lesion (as described below). 2.  Fevers.  Abscess versus tumor fever. 3.  Abnormal imaging studies, liver.  Large liver lesion.  Differential considerations include abscess (?gallstone etiology) versus necrotic tumor. 4.  Abnormal imaging studies, colon.  Circumferential lesion in ascending colon, worrisome for malignancy.   Plan:  1.  Continue medical therapy with antibiotics, intravenous fluids, pain control. 2.  Agree with IR biopsy/aspiration of liver lesion.  If infection seen, treat and tailor therapy accordingly.  If tumor seen, treat and tailor accordingly.   3.  If no definitive evidence of malignancy is seen after IR biopsy/aspiration of liver lesion, would then need to consider colonoscopy as next step in management. 4.  Case reviewed in detail with patient, his wife, and his son, who are all in agreement with above outlined course of management.   LOS: 0 days   Franck Vinal M  02/15/2014,  5:57 PM

## 2014-02-15 NOTE — Consult Note (Signed)
Reason for Consult: hepatic lesion and colon mass Referring Physician: Dr. Lala Lund   HPI: Brent Little is a 73 year old male with a history of hypertension who presents to [facility] with abdominal pain and malaise.  He reports being hospitalized in [place] Tues-Fri with pneumonia. He had an [place] of RUQ which was non specific.  He reports very marginal improvement.  He was treated with atbx for PNA.  Over the weekend he continued to have a poor appetite, malaise, abdominal pain.  He endorses the abdominal pain has worsened over the last 2 days. It is constant, worse with coughing or deep inspiration.  Alleviated with pain meds received here in the hospital.  No modifying factors.   He also had low grade temps and "sinusitis" type symptoms.  He had an outpatient  MRI of abdomen on the 6th  which showed a hepatic cystic lesion concerning for an abscess versus a mass.  It also showed an ascending colon mass.  The patient has never had a colonoscopy.  He denies recent weight loss, melena or hematochezia.  Denies any blood thinner use.  He was a former smoker, although stopped in his early 22s.  Denies alcohol use.     Past Medical History  Diagnosis Date  . Hypertension     Past Surgical History  Procedure Laterality Date  . Tonsillectomy      History reviewed. No pertinent family history.  Social History:  reports that he has quit smoking. He does not have any smokeless tobacco history on file. He reports that he does not drink alcohol. His drug history is not on file.  Allergies: No Known Allergies  Medications:  Prior to Admission medications   Medication Sig Start Date End Date Taking? Authorizing Provider  hydrochlorothiazide (HYDRODIURIL) 25 MG tablet Take 25 mg by mouth daily.   Yes Historical Provider, MD  potassium chloride SA (K-DUR,KLOR-CON) 20 MEQ tablet Take 20 mEq by mouth daily.  02/09/14  Yes Historical Provider, MD  ramipril (ALTACE) 10 MG capsule Take 10 mg by mouth daily.  01/29/14   Yes Historical Provider, MD  terazosin (HYTRIN) 5 MG capsule Take 5 mg by mouth daily.  01/29/14  Yes Historical Provider, MD     Results for orders placed during the hospital encounter of 02/15/14 (from the past 48 hour(s))  CBC WITH DIFFERENTIAL     Status: Abnormal   Collection Time    02/15/14 12:43 PM      Result Value Ref Range   WBC 17.8 (*) 4.0 - 10.5 K/uL   RBC 3.37 (*) 4.22 - 5.81 MIL/uL   Hemoglobin 8.8 (*) 13.0 - 17.0 g/dL   HCT 26.1 (*) 39.0 - 52.0 %   MCV 77.4 (*) 78.0 - 100.0 fL   MCH 26.1  26.0 - 34.0 pg   MCHC 33.7  30.0 - 36.0 g/dL   RDW 14.7  11.5 - 15.5 %   Platelets 302  150 - 400 K/uL   Neutrophils Relative % 85 (*) 43 - 77 %   Neutro Abs 15.1 (*) 1.7 - 7.7 K/uL   Lymphocytes Relative 9 (*) 12 - 46 %   Lymphs Abs 1.7  0.7 - 4.0 K/uL   Monocytes Relative 6  3 - 12 %   Monocytes Absolute 1.0  0.1 - 1.0 K/uL   Eosinophils Relative 0  0 - 5 %   Eosinophils Absolute 0.1  0.0 - 0.7 K/uL   Basophils Relative 0  0 - 1 %  Basophils Absolute 0.0  0.0 - 0.1 K/uL  COMPREHENSIVE METABOLIC PANEL     Status: Abnormal   Collection Time    02/15/14 12:43 PM      Result Value Ref Range   Sodium 135 (*) 137 - 147 mEq/L   Potassium 3.3 (*) 3.7 - 5.3 mEq/L   Chloride 95 (*) 96 - 112 mEq/L   CO2 26  19 - 32 mEq/L   Glucose, Bld 104 (*) 70 - 99 mg/dL   BUN 18  6 - 23 mg/dL   Creatinine, Ser 1.09  0.50 - 1.35 mg/dL   Calcium 8.7  8.4 - 10.5 mg/dL   Total Protein 6.3  6.0 - 8.3 g/dL   Albumin 2.1 (*) 3.5 - 5.2 g/dL   AST 42 (*) 0 - 37 U/L   ALT 31  0 - 53 U/L   Alkaline Phosphatase 209 (*) 39 - 117 U/L   Total Bilirubin 0.7  0.3 - 1.2 mg/dL   GFR calc non Af Amer 65 (*) >90 mL/min   GFR calc Af Amer 75 (*) >90 mL/min   Comment: (NOTE)     The eGFR has been calculated using the CKD EPI equation.     This calculation has not been validated in all clinical situations.     eGFR's persistently <90 mL/min signify possible Chronic Kidney     Disease.   Anion gap 14  5 -  15  PROTIME-INR     Status: Abnormal   Collection Time    02/15/14 12:43 PM      Result Value Ref Range   Prothrombin Time 15.6 (*) 11.6 - 15.2 seconds   INR 1.24  0.00 - 1.49  APTT     Status: None   Collection Time    02/15/14 12:43 PM      Result Value Ref Range   aPTT 35  24 - 37 seconds  PROTIME-INR     Status: None   Collection Time    02/15/14  3:39 PM      Result Value Ref Range   Prothrombin Time 15.0  11.6 - 15.2 seconds   INR 1.18  0.00 - 1.49    Dg Chest 2 View  02/15/2014   CLINICAL DATA:  Fever and fatigue, and possible liver abscess ; also body aches and urinary frequency and right lower quadrant abdominal pain for 1 month.  EXAM: CHEST  2 VIEW  COMPARISON:  PA and lateral chest x-ray of February 09, 2014 and February 06, 2014.  FINDINGS: The lungs are adequately inflated. The interstitial markings are mildly increased. There is a new tiny left pleural effusion. The cardiac silhouette is normal in size. The pulmonary vascularity is mildly prominent. There is persistent increased density in the retrocardiac region likely on the right which may reflect developing pneumonia. There is no pneumothorax. The mediastinum is normal in width. The bony thorax is unremarkable.  IMPRESSION: Mildly increased interstitial markings diffusely, and focally increased lung markings likely in the right lower lobe are noted. One cannot exclude early pneumonia. A small left pleural effusion has developed.   Electronically Signed   By: David  Martinique   On: 02/15/2014 13:02    Review of Systems  All other systems reviewed and are negative.  Blood pressure 123/59, pulse 65, temperature 98.7 F (37.1 C), temperature source Oral, resp. rate 21, height 6' (1.829 m), weight 185 lb (83.915 kg), SpO2 91.00%. Physical Exam  Constitutional: He is oriented to person, place,  and time. He appears well-developed and well-nourished. No distress.  Cardiovascular: Normal rate, regular rhythm, normal heart sounds  and intact distal pulses.  Exam reveals no gallop and no friction rub.   No murmur heard. Respiratory: Breath sounds normal. No respiratory distress. He has no wheezes. He has no rales. He exhibits no tenderness.  GI: Soft. Bowel sounds are normal. He exhibits no distension and no mass. There is no rebound and no guarding.  TTP RUQ  Musculoskeletal: Normal range of motion. He exhibits no edema and no tenderness.  Neurological: He is alert and oriented to person, place, and time.  Skin: Skin is warm and dry. No rash noted. He is not diaphoretic. No erythema. No pallor.  Psychiatric: He has a normal mood and affect. His behavior is normal. Judgment and thought content normal.    Assessment/Plan: Abdominal pain Leukocytosis Malaise Hepatic mass versus an abscess Ascending colon mass  Agree with IR biopsy and GI evaluation.  There is no role for surgical intervention at this time.  Will await biopsy and colonoscopy.  CEA is pending.  Further recommendations to follow pending above results.  Thank you for the consult.  We will follow along with you.   Harm Jou ANP-BC 02/15/2014, 4:21 PM

## 2014-02-16 DIAGNOSIS — I10 Essential (primary) hypertension: Secondary | ICD-10-CM

## 2014-02-16 DIAGNOSIS — K6389 Other specified diseases of intestine: Secondary | ICD-10-CM

## 2014-02-16 DIAGNOSIS — K762 Central hemorrhagic necrosis of liver: Secondary | ICD-10-CM

## 2014-02-16 LAB — BASIC METABOLIC PANEL
Anion gap: 13 (ref 5–15)
BUN: 19 mg/dL (ref 6–23)
CALCIUM: 8.6 mg/dL (ref 8.4–10.5)
CO2: 26 mEq/L (ref 19–32)
CREATININE: 1.21 mg/dL (ref 0.50–1.35)
Chloride: 95 mEq/L — ABNORMAL LOW (ref 96–112)
GFR calc non Af Amer: 57 mL/min — ABNORMAL LOW (ref >90)
GFR, EST AFRICAN AMERICAN: 66 mL/min — AB (ref >90)
Glucose, Bld: 116 mg/dL — ABNORMAL HIGH (ref 70–99)
Potassium: 3.6 mEq/L — ABNORMAL LOW (ref 3.7–5.3)
Sodium: 134 mEq/L — ABNORMAL LOW (ref 137–147)

## 2014-02-16 LAB — CBC
HCT: 26.8 % — ABNORMAL LOW (ref 39.0–52.0)
Hemoglobin: 9 g/dL — ABNORMAL LOW (ref 13.0–17.0)
MCH: 26.1 pg (ref 26.0–34.0)
MCHC: 33.6 g/dL (ref 30.0–36.0)
MCV: 77.7 fL — ABNORMAL LOW (ref 78.0–100.0)
Platelets: 303 10*3/uL (ref 150–400)
RBC: 3.45 MIL/uL — ABNORMAL LOW (ref 4.22–5.81)
RDW: 14.9 % (ref 11.5–15.5)
WBC: 16.6 10*3/uL — ABNORMAL HIGH (ref 4.0–10.5)

## 2014-02-16 LAB — FOLATE: FOLATE: 8.5 ng/mL

## 2014-02-16 LAB — VITAMIN B12: Vitamin B-12: 1804 pg/mL — ABNORMAL HIGH (ref 211–911)

## 2014-02-16 LAB — IRON AND TIBC
Iron: 13 ug/dL — ABNORMAL LOW (ref 42–135)
SATURATION RATIOS: 8 % — AB (ref 20–55)
TIBC: 166 ug/dL — AB (ref 215–435)
UIBC: 153 ug/dL (ref 125–400)

## 2014-02-16 LAB — RETICULOCYTES
RBC.: 3.29 MIL/uL — ABNORMAL LOW (ref 4.22–5.81)
Retic Count, Absolute: 32.9 10*3/uL (ref 19.0–186.0)
Retic Ct Pct: 1 % (ref 0.4–3.1)

## 2014-02-16 LAB — CANCER ANTIGEN 19-9: CA 19 9: 7.4 U/mL — AB (ref <35.0)

## 2014-02-16 LAB — FERRITIN: Ferritin: 440 ng/mL — ABNORMAL HIGH (ref 22–322)

## 2014-02-16 LAB — CEA: CEA: 9 ng/mL — ABNORMAL HIGH (ref 0.0–5.0)

## 2014-02-16 NOTE — Progress Notes (Signed)
[facility] PROGRESS NOTE  Trask Vosler JSE:[id number] DOB: 06/24/1940 DOA: 02/15/2014 PCP: [name], MD  Assessment/Plan: 1. Liver abscess vs necrotic mets  -continue current Abx-Vanc/Zosyn -FU Blood CX -CT guided biopsy per IR pending  2. Ascending colon mass -never had colonoscopy, needs this timing per GI -CEA mildly elevated  3. HTN -stable, ACE on hold  4. Anemia -check anemia panel  DVT proph: Hep SQ  Code Status: Full Code Family Communication: none at bedside Disposition Plan: pending stability   Consultants:  GI  CCS  IR  HPI/Subjective: Complains of R sided abd pain  Objective: Filed Vitals:   02/16/14 0350  BP: 135/61  Pulse: 86  Temp: 99.3 F (37.4 C)  Resp: 18    Intake/Output Summary (Last 24 hours) at 02/16/14 1051 Last data filed at 02/16/14 0351  Gross per 24 hour  Intake      0 ml  Output   1150 ml  Net  -1150 ml   Filed Weights   02/15/14 1154 02/16/14 0350  Weight: 83.915 kg (185 lb) 81 kg (178 lb 9.2 oz)    Exam:   General:  AAOx3, uncomfortable   Cardiovascular: S1S2/RRR  Respiratory: CTAB  Abdomen: soft, tender in RUQ, BS present  Musculoskeletal: no edema c/c   Data Reviewed: Basic Metabolic Panel:  Recent Labs Lab 02/15/14 1243 02/16/14 0415  NA 135* 134*  K 3.3* 3.6*  CL 95* 95*  CO2 26 26  GLUCOSE 104* 116*  BUN 18 19  CREATININE 1.09 1.21  CALCIUM 8.7 8.6   Liver Function Tests:  Recent Labs Lab 02/15/14 1243  AST 42*  ALT 31  ALKPHOS 209*  BILITOT 0.7  PROT 6.3  ALBUMIN 2.1*   No results found for this basename: LIPASE, AMYLASE,  in the last 168 hours No results found for this basename: AMMONIA,  in the last 168 hours CBC:  Recent Labs Lab 02/15/14 1243 02/16/14 0415  WBC 17.8* 16.6*  NEUTROABS 15.1*  --   HGB 8.8* 9.0*  HCT 26.1* 26.8*  MCV 77.4* 77.7*  PLT 302 303   Cardiac Enzymes: No results found for this basename: CKTOTAL, CKMB, CKMBINDEX, TROPONINI,  in  the last 168 hours BNP (last 3 results) No results found for this basename: PROBNP,  in the last 8760 hours CBG: No results found for this basename: GLUCAP,  in the last 168 hours  Recent Results (from the past 240 hour(s))  CULTURE, BLOOD (ROUTINE X 2)     Status: None   Collection Time    02/15/14  3:15 PM      Result Value Ref Range Status   Specimen Description BLOOD HAND RIGHT   Final   Special Requests BOTTLES DRAWN AEROBIC AND ANAEROBIC 5CC   Final   Culture  Setup Time     Final   Value: 02/15/2014 20:13     Performed at [facility]   Culture     Final   Value:        BLOOD CULTURE RECEIVED NO GROWTH TO DATE CULTURE WILL BE HELD FOR 5 DAYS BEFORE ISSUING A FINAL NEGATIVE REPORT     Performed at [facility]   Report Status PENDING   Incomplete  CULTURE, BLOOD (ROUTINE X 2)     Status: None   Collection Time    02/15/14  3:30 PM      Result Value Ref Range Status   Specimen Description BLOOD ARM LEFT   Final  Special Requests BOTTLES DRAWN AEROBIC ONLY 5CC   Final   Culture  Setup Time     Final   Value: 02/15/2014 20:12     Performed at [facility]   Culture     Final   Value:        BLOOD CULTURE RECEIVED NO GROWTH TO DATE CULTURE WILL BE HELD FOR 5 DAYS BEFORE ISSUING A FINAL NEGATIVE REPORT     Performed at [facility]   Report Status PENDING   Incomplete     Studies: Dg Chest 2 View  02/15/2014   CLINICAL DATA:  Fever and fatigue, and possible liver abscess ; also body aches and urinary frequency and right lower quadrant abdominal pain for 1 month.  EXAM: CHEST  2 VIEW  COMPARISON:  PA and lateral chest x-ray of February 09, 2014 and February 06, 2014.  FINDINGS: The lungs are adequately inflated. The interstitial markings are mildly increased. There is a new tiny left pleural effusion. The cardiac silhouette is normal in size. The pulmonary vascularity is mildly prominent. There is persistent increased density in the retrocardiac  region likely on the right which may reflect developing pneumonia. There is no pneumothorax. The mediastinum is normal in width. The bony thorax is unremarkable.  IMPRESSION: Mildly increased interstitial markings diffusely, and focally increased lung markings likely in the right lower lobe are noted. One cannot exclude early pneumonia. A small left pleural effusion has developed.   Electronically Signed   By: David  Martinique   On: 02/15/2014 13:02    Scheduled Meds: . heparin  5,000 Units Subcutaneous 3 times per day  . Influenza vac split quadrivalent PF  0.5 mL Intramuscular Tomorrow-1000  . piperacillin-tazobactam (ZOSYN)  IV  3.375 g Intravenous 3 times per day  . pneumococcal 23 valent vaccine  0.5 mL Intramuscular Tomorrow-1000  . terazosin  5 mg Oral Daily  . vancomycin  1,000 mg Intravenous Q12H   Continuous Infusions: . 0.9 % NaCl with KCl 20 mEq / L 75 mL/hr at 02/15/14 1530   Antibiotics Given (last 72 hours)   Date/Time Action Medication Dose Rate   02/15/14 1830 Given   piperacillin-tazobactam (ZOSYN) IVPB 3.375 g 3.375 g 100 mL/hr   02/15/14 2353 Given   piperacillin-tazobactam (ZOSYN) IVPB 3.375 g 3.375 g 12.5 mL/hr   02/16/14 0820 Given   piperacillin-tazobactam (ZOSYN) IVPB 3.375 g 3.375 g 12.5 mL/hr      Principal Problem:   Essential hypertension Active Problems:   Liver abscess   BPH (benign prostatic hyperplasia)   Colonic mass    Time spent: 88min    Carsyn Taubman  [facility] Pager [phone]. If 7PM-7AM, please contact night-coverage at [email or web address], password [facility] 02/16/2014, 10:51 AM  LOS: 1 day

## 2014-02-16 NOTE — Progress Notes (Signed)
  Subjective: No complaints today other than being thirsty  Objective: Vital signs in last 24 hours: Temp:  [98.1 F (36.7 C)-99.3 F (37.4 C)] 99.3 F (37.4 C) (10/09 0350) Pulse Rate:  [59-86] 86 (10/09 0350) Resp:  [14-22] 18 (10/09 0350) BP: (100-135)/(40-61) 135/61 mmHg (10/09 0350) SpO2:  [90 %-98 %] 94 % (10/09 0350) Weight:  [178 lb 9.2 oz (81 kg)] 178 lb 9.2 oz (81 kg) (10/09 0350) Last BM Date: 02/14/14  Intake/Output from previous day: 10/08 0701 - 10/09 0700 In: -  Out: 1150 [Urine:1150] Intake/Output this shift:    Resp: clear to auscultation bilaterally Cardio: regular rate and rhythm GI: soft, nontender  Lab Results:   Recent Labs  02/15/14 1243 02/16/14 0415  WBC 17.8* 16.6*  HGB 8.8* 9.0*  HCT 26.1* 26.8*  PLT 302 303   BMET  Recent Labs  02/15/14 1243 02/16/14 0415  NA 135* 134*  K 3.3* 3.6*  CL 95* 95*  CO2 26 26  GLUCOSE 104* 116*  BUN 18 19  CREATININE 1.09 1.21  CALCIUM 8.7 8.6   PT/INR  Recent Labs  02/15/14 1243 02/15/14 1539  LABPROT 15.6* 15.0  INR 1.24 1.18   ABG No results found for this basename: PHART, PCO2, PO2, HCO3,  in the last 72 hours  Studies/Results: Dg Chest 2 View  02/15/2014   CLINICAL DATA:  Fever and fatigue, and possible liver abscess ; also body aches and urinary frequency and right lower quadrant abdominal pain for 1 month.  EXAM: CHEST  2 VIEW  COMPARISON:  PA and lateral chest x-ray of February 09, 2014 and February 06, 2014.  FINDINGS: The lungs are adequately inflated. The interstitial markings are mildly increased. There is a new tiny left pleural effusion. The cardiac silhouette is normal in size. The pulmonary vascularity is mildly prominent. There is persistent increased density in the retrocardiac region likely on the right which may reflect developing pneumonia. There is no pneumothorax. The mediastinum is normal in width. The bony thorax is unremarkable.  IMPRESSION: Mildly increased  interstitial markings diffusely, and focally increased lung markings likely in the right lower lobe are noted. One cannot exclude early pneumonia. A small left pleural effusion has developed.   Electronically Signed   By: David  Martinique   On: 02/15/2014 13:02    Anti-infectives: Anti-infectives   Start     Dose/Rate Route Frequency Ordered Stop   02/16/14 0000  piperacillin-tazobactam (ZOSYN) IVPB 3.375 g     3.375 g 12.5 mL/hr over 240 Minutes Intravenous 3 times per day 02/15/14 1516     02/15/14 1600  vancomycin (VANCOCIN) IVPB 1000 mg/200 mL premix     1,000 mg 200 mL/hr over 60 Minutes Intravenous Every 12 hours 02/15/14 1516     02/15/14 1600  piperacillin-tazobactam (ZOSYN) IVPB 3.375 g     3.375 g 100 mL/hr over 30 Minutes Intravenous  Once 02/15/14 1516 02/15/14 1900      Assessment/Plan: s/p * No surgery found * Plan for CT guided biopsy of liver mass today May need colonoscopy to evaluate colon mass depending on what is found today No symptoms of obstruction Will follow  LOS: 1 day    TOTH III,PAUL S 02/16/2014

## 2014-02-16 NOTE — Progress Notes (Signed)
Brent Little. In radiology notified writer that liver biopsy will not be done today till Monday. Started patient on full liquid diet at this time.

## 2014-02-16 NOTE — Progress Notes (Signed)
UR complete.  Uthman Mroczkowski RN, MSN 

## 2014-02-16 NOTE — Progress Notes (Signed)
Subjective: Abdominal pain continues.  Objective: Vital signs in last 24 hours: Temp:  [98.1 F (36.7 C)-100.2 F (37.9 C)] 100.2 F (37.9 C) (10/09 1319) Pulse Rate:  [59-99] 99 (10/09 1319) Resp:  [18-22] 19 (10/09 1319) BP: (100-135)/(40-61) 111/59 mmHg (10/09 1319) SpO2:  [91 %-98 %] 92 % (10/09 1319) Weight:  [81 kg (178 lb 9.2 oz)] 81 kg (178 lb 9.2 oz) (10/09 0350) Weight change:  Last BM Date: 02/14/14  PE: GEN:  Sallow-appearing skin, uncomfortable-appearing but is not acutely toxic ABD:  Tender right hemiabdomen, voluntary guarding, bowel sounds present, hepatomegaly noted NEURO: Intermittently confused.  Lab Results: CBC    Component Value Date/Time   WBC 16.6* 02/16/2014 0415   RBC 3.29* 02/16/2014 1213   RBC 3.45* 02/16/2014 0415   HGB 9.0* 02/16/2014 0415   HCT 26.8* 02/16/2014 0415   PLT 303 02/16/2014 0415   MCV 77.7* 02/16/2014 0415   MCH 26.1 02/16/2014 0415   MCHC 33.6 02/16/2014 0415   RDW 14.9 02/16/2014 0415   LYMPHSABS 1.7 02/15/2014 1243   MONOABS 1.0 02/15/2014 1243   EOSABS 0.1 02/15/2014 1243   BASOSABS 0.0 02/15/2014 1243   CMP     Component Value Date/Time   NA 134* 02/16/2014 0415   K 3.6* 02/16/2014 0415   CL 95* 02/16/2014 0415   CO2 26 02/16/2014 0415   GLUCOSE 116* 02/16/2014 0415   BUN 19 02/16/2014 0415   CREATININE 1.21 02/16/2014 0415   CALCIUM 8.6 02/16/2014 0415   PROT 6.3 02/15/2014 1243   ALBUMIN 2.1* 02/15/2014 1243   AST 42* 02/15/2014 1243   ALT 31 02/15/2014 1243   ALKPHOS 209* 02/15/2014 1243   BILITOT 0.7 02/15/2014 1243   GFRNONAA 57* 02/16/2014 0415   GFRAA 66* 02/16/2014 0415   Assessment:  1.  Right upper quadrant abdominal pain. 2.  Altered mental status, ? Infection +/- pain medications? 3.  Large liver lesion, abscess versus necrotic mass. 4.  Circumferential thickening of ascending colon. 5.  Anemia without overt GI tract bleeding.  Plan:  1.  Needs IR-guided biopsy/aspiration of liver lesion.  Will discuss timing  options with IR, given patient's persistent pain, as well as fact that all the next steps in management are pending these aspiration/biopsy results. 2.  Continue antibiotics and analgesics. 3.  May very well ultimately require colonoscopy this admission. 4.  [facility] will follow.   Brent Little M 02/16/2014, 1:40 PM

## 2014-02-16 NOTE — Consult Note (Signed)
Reason for consult: liver lesion biopsy  Referring Physician(s): TRH  History of Present Illness: Brent Little is a 73 y.o. male with history of recent URI treated with antbx and subsequent onset of fevers, night sweats,abd pain, malaise, decreased appetite,N/V, mild weight loss as well as diminshed urine output. Imaging studies performed at [facility] revealed an ascending colon lesion as well as hepatic mass vs? abscess. Pt denies prior hx of cancer and was feeling well otherwise up until few weeks ago.Request now received for US guided liver lesion biopsy/aspiration.  Past Medical History  Diagnosis Date  . Hypertension     Past Surgical History  Procedure Laterality Date  . Tonsillectomy      Allergies: Review of patient's allergies indicates no known allergies.  Medications: Prior to Admission medications   Medication Sig Start Date End Date Taking? Authorizing Provider  hydrochlorothiazide (HYDRODIURIL) 25 MG tablet Take 25 mg by mouth daily.   Yes Historical Provider, MD  potassium chloride SA (K-DUR,KLOR-CON) 20 MEQ tablet Take 20 mEq by mouth daily.  02/09/14  Yes Historical Provider, MD  ramipril (ALTACE) 10 MG capsule Take 10 mg by mouth daily.  01/29/14  Yes Historical Provider, MD  terazosin (HYTRIN) 5 MG capsule Take 5 mg by mouth daily.  01/29/14  Yes Historical Provider, MD    History reviewed. No pertinent family history.  History   Social History  . Marital Status: Married    Spouse Name: N/A    Number of Children: N/A  . Years of Education: N/A   Social History Main Topics  . Smoking status: Former Research scientist (life sciences)  . Smokeless tobacco: None  . Alcohol Use: No  . Drug Use: None  . Sexual Activity: None   Other Topics Concern  . None   Social History Narrative  . None         Review of Systems    Constitutional: Positive for appetite change, fatigue and unexpected weight change. Negative for fever and chills.  Respiratory: Positive for  cough and shortness of breath.   Cardiovascular: Negative for chest pain.  Gastrointestinal: Positive for abdominal pain and abdominal distention. Negative for nausea, vomiting and blood in stool.  Genitourinary: Positive for dysuria and urgency.       Diminished urine output  Neurological: Positive for headaches.    Vital Signs: BP 111/59  Pulse 99  Temp(Src) 100.2 F (37.9 C) (Oral)  Resp 19  Ht 6' (1.829 m)  Wt 178 lb 9.2 oz (81 kg)  BMI 24.21 kg/m2  SpO2 92%  Physical Exam  Constitutional: He is oriented to person, place, and time. He appears well-developed and well-nourished.  Cardiovascular: Normal rate and regular rhythm.   Pulmonary/Chest: Effort normal.  Dim BS bases  Abdominal: Soft. Bowel sounds are normal. There is tenderness.  Mild distension  Musculoskeletal: Normal range of motion.  Neurological: He is alert and oriented to person, place, and time.    Imaging: Dg Chest 2 View  02/15/2014   CLINICAL DATA:  Fever and fatigue, and possible liver abscess ; also body aches and urinary frequency and right lower quadrant abdominal pain for 1 month.  EXAM: CHEST  2 VIEW  COMPARISON:  PA and lateral chest x-ray of February 09, 2014 and February 06, 2014.  FINDINGS: The lungs are adequately inflated. The interstitial markings are mildly increased. There is a new tiny left pleural effusion. The cardiac silhouette is normal in size. The pulmonary vascularity is mildly prominent. There is persistent increased  density in the retrocardiac region likely on the right which may reflect developing pneumonia. There is no pneumothorax. The mediastinum is normal in width. The bony thorax is unremarkable.  IMPRESSION: Mildly increased interstitial markings diffusely, and focally increased lung markings likely in the right lower lobe are noted. One cannot exclude early pneumonia. A small left pleural effusion has developed.   Electronically Signed   By: David  Martinique   On: 02/15/2014 13:02     Labs:  CBC:  Recent Labs  02/15/14 1243 02/16/14 0415  WBC 17.8* 16.6*  HGB 8.8* 9.0*  HCT 26.1* 26.8*  PLT 302 303    COAGS:  Recent Labs  02/15/14 1243 02/15/14 1539  INR 1.24 1.18  APTT 35  --     BMP:  Recent Labs  02/15/14 1243 02/16/14 0415  NA 135* 134*  K 3.3* 3.6*  CL 95* 95*  CO2 26 26  GLUCOSE 104* 116*  BUN 18 19  CALCIUM 8.7 8.6  CREATININE 1.09 1.21  GFRNONAA 65* 57*  GFRAA 75* 66*    LIVER FUNCTION TESTS:  Recent Labs  02/15/14 1243  BILITOT 0.7  AST 42*  ALT 31  ALKPHOS 209*  PROT 6.3  ALBUMIN 2.1*    TUMOR MARKERS:  Recent Labs  02/15/14 1858  CEA 9.0*  CA199 7.4*    Assessment and Plan: Pt with recent URI, fever, wt loss, night sweats, leukocytosis, abd pain , malaise, N/V, recent imaging revealing ascending colon and liver lesions/?abscess. Plan is for US guided liver lesion biopsy/aspiration on 10/12. Images have been reviewed by Dr. Annamaria Boots. Details/risks of procedure d/w pt/family with their understanding and consent. Procedure was not performed 10/9 due to pt receiving SQ heparin this morning.         I spent a total of 20 minutes face to face in clinical consultation, greater than 50% of which was counseling/coordinating care for liver lesion biopsy.  Signed: Autumn Messing 02/16/2014, 5:14 PM

## 2014-02-17 LAB — HEPATIC FUNCTION PANEL
ALT: 23 U/L (ref 0–53)
AST: 31 U/L (ref 0–37)
Albumin: 2.3 g/dL — ABNORMAL LOW (ref 3.5–5.2)
Alkaline Phosphatase: 197 U/L — ABNORMAL HIGH (ref 39–117)
BILIRUBIN INDIRECT: 0.7 mg/dL (ref 0.3–0.9)
BILIRUBIN TOTAL: 1.8 mg/dL — AB (ref 0.3–1.2)
Bilirubin, Direct: 1.1 mg/dL — ABNORMAL HIGH (ref 0.0–0.3)
Total Protein: 6.7 g/dL (ref 6.0–8.3)

## 2014-02-17 LAB — BASIC METABOLIC PANEL
Anion gap: 14 (ref 5–15)
BUN: 17 mg/dL (ref 6–23)
CHLORIDE: 94 meq/L — AB (ref 96–112)
CO2: 27 meq/L (ref 19–32)
Calcium: 8.8 mg/dL (ref 8.4–10.5)
Creatinine, Ser: 1.31 mg/dL (ref 0.50–1.35)
GFR calc Af Amer: 60 mL/min — ABNORMAL LOW (ref >90)
GFR calc non Af Amer: 52 mL/min — ABNORMAL LOW (ref >90)
Glucose, Bld: 95 mg/dL (ref 70–99)
POTASSIUM: 3.7 meq/L (ref 3.7–5.3)
Sodium: 135 mEq/L — ABNORMAL LOW (ref 137–147)

## 2014-02-17 LAB — CBC
HCT: 27.9 % — ABNORMAL LOW (ref 39.0–52.0)
HEMOGLOBIN: 9.4 g/dL — AB (ref 13.0–17.0)
MCH: 26.3 pg (ref 26.0–34.0)
MCHC: 33.7 g/dL (ref 30.0–36.0)
MCV: 77.9 fL — ABNORMAL LOW (ref 78.0–100.0)
Platelets: 348 10*3/uL (ref 150–400)
RBC: 3.58 MIL/uL — AB (ref 4.22–5.81)
RDW: 14.8 % (ref 11.5–15.5)
WBC: 16.5 10*3/uL — ABNORMAL HIGH (ref 4.0–10.5)

## 2014-02-17 LAB — LACTATE DEHYDROGENASE: LDH: 123 U/L (ref 94–250)

## 2014-02-17 MED ORDER — MORPHINE SULFATE 2 MG/ML IJ SOLN
1.0000 mg | INTRAMUSCULAR | Status: DC | PRN
  Administered 2014-02-19 (×2): 1 mg via INTRAVENOUS
  Filled 2014-02-17 (×2): qty 1

## 2014-02-17 MED ORDER — ENOXAPARIN SODIUM 40 MG/0.4ML ~~LOC~~ SOLN
40.0000 mg | SUBCUTANEOUS | Status: AC
  Administered 2014-02-17: 17:00:00 via SUBCUTANEOUS
  Filled 2014-02-17: qty 0.4

## 2014-02-17 NOTE — Progress Notes (Signed)
[facility] Progress Note  Subjective: Alert oriented, complaining of some vague right-sided abdominal pain  Objective: Vital signs in last 24 hours: Temp:  [97.9 F (36.6 Little)-100.2 F (37.9 Little)] 98.4 F (36.9 Little) (10/10 0405) Pulse Rate:  [63-99] 90 (10/10 0405) Resp:  [18-19] 18 (10/10 0405) BP: (109-119)/(42-59) 119/42 mmHg (10/10 0405) SpO2:  [91 %-94 %] 91 % (10/10 0405) Weight:  [78.155 kg (172 lb 4.8 oz)] 78.155 kg (172 lb 4.8 oz) (10/10 0405) Weight change: -5.761 kg (-12 lb 11.2 oz)   PE: Abdomen is soft minimal right sided abdominal pain, no obvious mass.  Lab Results: Results for orders placed during the hospital encounter of 02/15/14 (from the past 24 hour(s))  VITAMIN B12     Status: Abnormal   Collection Time    02/16/14 12:13 PM      Result Value Ref Range   Vitamin B-12 1804 (*) 211 - 911 pg/mL  FOLATE     Status: None   Collection Time    02/16/14 12:13 PM      Result Value Ref Range   Folate 8.5    IRON AND TIBC     Status: Abnormal   Collection Time    02/16/14 12:13 PM      Result Value Ref Range   Iron 13 (*) 42 - 135 ug/dL   TIBC 166 (*) 215 - 435 ug/dL   Saturation Ratios 8 (*) 20 - 55 %   UIBC 153  125 - 400 ug/dL  FERRITIN     Status: Abnormal   Collection Time    02/16/14 12:13 PM      Result Value Ref Range   Ferritin 440 (*) 22 - 322 ng/mL  RETICULOCYTES     Status: Abnormal   Collection Time    02/16/14 12:13 PM      Result Value Ref Range   Retic Ct Pct 1.0  0.4 - 3.1 %   RBC. 3.29 (*) 4.22 - 5.81 MIL/uL   Retic Count, Manual 32.9  19.0 - 186.0 K/uL  BASIC METABOLIC PANEL     Status: Abnormal   Collection Time    02/17/14 12:26 AM      Result Value Ref Range   Sodium 135 (*) 137 - 147 mEq/L   Potassium 3.7  3.7 - 5.3 mEq/L   Chloride 94 (*) 96 - 112 mEq/L   CO2 27  19 - 32 mEq/L   Glucose, Bld 95  70 - 99 mg/dL   BUN 17  6 - 23 mg/dL   Creatinine, Ser 1.31  0.50 - 1.35 mg/dL   Calcium 8.8  8.4 - 10.5 mg/dL   GFR  calc non Af Amer 52 (*) >90 mL/min   GFR calc Af Amer 60 (*) >90 mL/min   Anion gap 14  5 - 15  CBC     Status: Abnormal   Collection Time    02/17/14 12:26 AM      Result Value Ref Range   WBC 16.5 (*) 4.0 - 10.5 K/uL   RBC 3.58 (*) 4.22 - 5.81 MIL/uL   Hemoglobin 9.4 (*) 13.0 - 17.0 g/dL   HCT 27.9 (*) 39.0 - 52.0 %   MCV 77.9 (*) 78.0 - 100.0 fL   MCH 26.3  26.0 - 34.0 pg   MCHC 33.7  30.0 - 36.0 g/dL   RDW 14.8  11.5 - 15.5 %   Platelets 348  150 - 400 K/uL  LACTATE DEHYDROGENASE  Status: None   Collection Time    02/17/14 12:26 AM      Result Value Ref Range   LDH 123  94 - 250 U/L  HEPATIC FUNCTION PANEL     Status: Abnormal   Collection Time    02/17/14 12:26 AM      Result Value Ref Range   Total Protein 6.7  6.0 - 8.3 g/dL   Albumin 2.3 (*) 3.5 - 5.2 g/dL   AST 31  0 - 37 U/L   ALT 23  0 - 53 U/L   Alkaline Phosphatase 197 (*) 39 - 117 U/L   Total Bilirubin 1.8 (*) 0.3 - 1.2 mg/dL   Bilirubin, Direct 1.1 (*) 0.0 - 0.3 mg/dL   Indirect Bilirubin 0.7  0.3 - 0.9 mg/dL    Studies/Results: Dg Chest 2 View  02/15/2014   CLINICAL DATA:  Fever and fatigue, and possible liver abscess ; also body aches and urinary frequency and right lower quadrant abdominal pain for 1 month.  EXAM: CHEST  2 VIEW  COMPARISON:  PA and lateral chest x-ray of February 09, 2014 and February 06, 2014.  FINDINGS: The lungs are adequately inflated. The interstitial markings are mildly increased. There is a new tiny left pleural effusion. The cardiac silhouette is normal in size. The pulmonary vascularity is mildly prominent. There is persistent increased density in the retrocardiac region likely on the right which may reflect developing pneumonia. There is no pneumothorax. The mediastinum is normal in width. The bony thorax is unremarkable.  IMPRESSION: Mildly increased interstitial markings diffusely, and focally increased lung markings likely in the right lower lobe are noted. One cannot exclude  early pneumonia. A small left pleural effusion has developed.   Electronically Signed   By: David  Martinique   On: 02/15/2014 13:02      Assessment: 1. Hepatic mass versus abscess, 2. ascending colon Circumferential thickening, slight elevation of CEA level  Plan: Awaiting timing of interventional radiology drainage versus biopsy of mass/abscess. Continue Zosyn and vancomycin. Depending on the results of hepatic abscess biopsy/aspiration, may need colonoscopy this admission.    Brent Little 02/17/2014, 8:41 AM

## 2014-02-17 NOTE — Progress Notes (Signed)
[facility] PROGRESS NOTE  Brent Little DTO:[id number] DOB: 1941/01/22 DOA: 02/15/2014 PCP: [name], MD  Assessment/Plan: 1. Liver abscess vs necrotic mets  -continue current Abx-Vanc/Zosyn -afebrile today -FU Blood CX -CT guided biopsy per IR for Monday  2. Ascending colon mass -never had colonoscopy, needs this timing per GI -CEA mildly elevated  3. HTN -stable, ACE on hold  4. Anemia -anemia panel c/w Iron deficiency  DVT proph: Hep SQ  Code Status: Full Code Family Communication: none at bedside Disposition Plan: pending stability   Consultants:  GI  CCS  IR  HPI/Subjective: Complains of R sided abd pain  Objective: Filed Vitals:   02/17/14 0405  BP: 119/42  Pulse: 90  Temp: 98.4 F (36.9 C)  Resp: 18    Intake/Output Summary (Last 24 hours) at 02/17/14 1339 Last data filed at 02/17/14 1100  Gross per 24 hour  Intake    730 ml  Output    525 ml  Net    205 ml   Filed Weights   02/15/14 1154 02/16/14 0350 02/17/14 0405  Weight: 83.915 kg (185 lb) 81 kg (178 lb 9.2 oz) 78.155 kg (172 lb 4.8 oz)    Exam:   General:  AAOx3, uncomfortable   Cardiovascular: S1S2/RRR  Respiratory: CTAB  Abdomen: soft, tender in RUQ, BS present  Musculoskeletal: no edema c/c   Data Reviewed: Basic Metabolic Panel:  Recent Labs Lab 02/15/14 1243 02/16/14 0415 02/17/14 0026  NA 135* 134* 135*  K 3.3* 3.6* 3.7  CL 95* 95* 94*  CO2 26 26 27   GLUCOSE 104* 116* 95  BUN 18 19 17   CREATININE 1.09 1.21 1.31  CALCIUM 8.7 8.6 8.8   Liver Function Tests:  Recent Labs Lab 02/15/14 1243 02/17/14 0026  AST 42* 31  ALT 31 23  ALKPHOS 209* 197*  BILITOT 0.7 1.8*  PROT 6.3 6.7  ALBUMIN 2.1* 2.3*   No results found for this basename: LIPASE, AMYLASE,  in the last 168 hours No results found for this basename: AMMONIA,  in the last 168 hours CBC:  Recent Labs Lab 02/15/14 1243 02/16/14 0415 02/17/14 0026  WBC 17.8* 16.6* 16.5*   NEUTROABS 15.1*  --   --   HGB 8.8* 9.0* 9.4*  HCT 26.1* 26.8* 27.9*  MCV 77.4* 77.7* 77.9*  PLT 302 303 348   Cardiac Enzymes: No results found for this basename: CKTOTAL, CKMB, CKMBINDEX, TROPONINI,  in the last 168 hours BNP (last 3 results) No results found for this basename: PROBNP,  in the last 8760 hours CBG: No results found for this basename: GLUCAP,  in the last 168 hours  Recent Results (from the past 240 hour(s))  CULTURE, BLOOD (ROUTINE X 2)     Status: None   Collection Time    02/15/14  3:15 PM      Result Value Ref Range Status   Specimen Description BLOOD HAND RIGHT   Final   Special Requests BOTTLES DRAWN AEROBIC AND ANAEROBIC 5CC   Final   Culture  Setup Time     Final   Value: 02/15/2014 20:13     Performed at [facility]   Culture     Final   Value:        BLOOD CULTURE RECEIVED NO GROWTH TO DATE CULTURE WILL BE HELD FOR 5 DAYS BEFORE ISSUING A FINAL NEGATIVE REPORT     Performed at [facility]   Report Status PENDING   Incomplete  CULTURE,  BLOOD (ROUTINE X 2)     Status: None   Collection Time    02/15/14  3:30 PM      Result Value Ref Range Status   Specimen Description BLOOD ARM LEFT   Final   Special Requests BOTTLES DRAWN AEROBIC ONLY 5CC   Final   Culture  Setup Time     Final   Value: 02/15/2014 20:12     Performed at [facility]   Culture     Final   Value:        BLOOD CULTURE RECEIVED NO GROWTH TO DATE CULTURE WILL BE HELD FOR 5 DAYS BEFORE ISSUING A FINAL NEGATIVE REPORT     Performed at [facility]   Report Status PENDING   Incomplete     Studies: No results found.  Scheduled Meds: . Influenza vac split quadrivalent PF  0.5 mL Intramuscular Tomorrow-1000  . piperacillin-tazobactam (ZOSYN)  IV  3.375 g Intravenous 3 times per day  . pneumococcal 23 valent vaccine  0.5 mL Intramuscular Tomorrow-1000  . terazosin  5 mg Oral Daily  . vancomycin  1,000 mg Intravenous Q12H   Continuous Infusions:    Antibiotics Given (last 72 hours)   Date/Time Action Medication Dose Rate   02/15/14 1830 Given   piperacillin-tazobactam (ZOSYN) IVPB 3.375 g 3.375 g 100 mL/hr   02/15/14 2353 Given   piperacillin-tazobactam (ZOSYN) IVPB 3.375 g 3.375 g 12.5 mL/hr   02/16/14 0820 Given   piperacillin-tazobactam (ZOSYN) IVPB 3.375 g 3.375 g 12.5 mL/hr   02/16/14 1705 Given   piperacillin-tazobactam (ZOSYN) IVPB 3.375 g 3.375 g 12.5 mL/hr   02/17/14 0033 Given   piperacillin-tazobactam (ZOSYN) IVPB 3.375 g 3.375 g 12.5 mL/hr   02/17/14 1100 Given   piperacillin-tazobactam (ZOSYN) IVPB 3.375 g 3.375 g 12.5 mL/hr      Principal Problem:   Essential hypertension Active Problems:   Liver abscess   BPH (benign prostatic hyperplasia)   Colonic mass    Time spent: 35min    Emma-Lee Oddo  [facility] Pager [phone]. If 7PM-7AM, please contact night-coverage at [email or web address], password [facility] 02/17/2014, 1:39 PM  LOS: 2 days

## 2014-02-17 NOTE — Progress Notes (Signed)
Patient ID: Brent Little, male   DOB: [birth year], 73 y.o.   MRN: [id number] [facility] Surgery Progress Note:   * No surgery found *  Subjective: Mental status is alert and responsive.   Objective: Vital signs in last 24 hours: Temp:  [97.9 F (36.6 C)-100.2 F (37.9 C)] 98.4 F (36.9 C) (10/10 0405) Pulse Rate:  [63-99] 90 (10/10 0405) Resp:  [18-19] 18 (10/10 0405) BP: (109-119)/(42-59) 119/42 mmHg (10/10 0405) SpO2:  [91 %-94 %] 91 % (10/10 0405) Weight:  [172 lb 4.8 oz (78.155 kg)] 172 lb 4.8 oz (78.155 kg) (10/10 0405)  Intake/Output from previous day: 10/09 0701 - 10/10 0700 In: 600 [P.O.:300; IV Piggyback:300] Out: 325 [Urine:325] Intake/Output this shift:    Physical Exam: Work of breathing is not labored.  Up at bedside voiding.  Minimal abdominal pain.    Lab Results:  Results for orders placed during the hospital encounter of 02/15/14 (from the past 48 hour(s))  CBC WITH DIFFERENTIAL     Status: Abnormal   Collection Time    02/15/14 12:43 PM      Result Value Ref Range   WBC 17.8 (*) 4.0 - 10.5 K/uL   RBC 3.37 (*) 4.22 - 5.81 MIL/uL   Hemoglobin 8.8 (*) 13.0 - 17.0 g/dL   HCT 26.1 (*) 39.0 - 52.0 %   MCV 77.4 (*) 78.0 - 100.0 fL   MCH 26.1  26.0 - 34.0 pg   MCHC 33.7  30.0 - 36.0 g/dL   RDW 14.7  11.5 - 15.5 %   Platelets 302  150 - 400 K/uL   Neutrophils Relative % 85 (*) 43 - 77 %   Neutro Abs 15.1 (*) 1.7 - 7.7 K/uL   Lymphocytes Relative 9 (*) 12 - 46 %   Lymphs Abs 1.7  0.7 - 4.0 K/uL   Monocytes Relative 6  3 - 12 %   Monocytes Absolute 1.0  0.1 - 1.0 K/uL   Eosinophils Relative 0  0 - 5 %   Eosinophils Absolute 0.1  0.0 - 0.7 K/uL   Basophils Relative 0  0 - 1 %   Basophils Absolute 0.0  0.0 - 0.1 K/uL  COMPREHENSIVE METABOLIC PANEL     Status: Abnormal   Collection Time    02/15/14 12:43 PM      Result Value Ref Range   Sodium 135 (*) 137 - 147 mEq/L   Potassium 3.3 (*) 3.7 - 5.3 mEq/L   Chloride 95 (*) 96 - 112 mEq/L   CO2 26  19 - 32  mEq/L   Glucose, Bld 104 (*) 70 - 99 mg/dL   BUN 18  6 - 23 mg/dL   Creatinine, Ser 1.09  0.50 - 1.35 mg/dL   Calcium 8.7  8.4 - 10.5 mg/dL   Total Protein 6.3  6.0 - 8.3 g/dL   Albumin 2.1 (*) 3.5 - 5.2 g/dL   AST 42 (*) 0 - 37 U/L   ALT 31  0 - 53 U/L   Alkaline Phosphatase 209 (*) 39 - 117 U/L   Total Bilirubin 0.7  0.3 - 1.2 mg/dL   GFR calc non Af Amer 65 (*) >90 mL/min   GFR calc Af Amer 75 (*) >90 mL/min   Comment: (NOTE)     The eGFR has been calculated using the CKD EPI equation.     This calculation has not been validated in all clinical situations.     eGFR's persistently <90 mL/min signify possible  Chronic Kidney     Disease.   Anion gap 14  5 - 15  PROTIME-INR     Status: Abnormal   Collection Time    02/15/14 12:43 PM      Result Value Ref Range   Prothrombin Time 15.6 (*) 11.6 - 15.2 seconds   INR 1.24  0.00 - 1.49  APTT     Status: None   Collection Time    02/15/14 12:43 PM      Result Value Ref Range   aPTT 35  24 - 37 seconds  CULTURE, BLOOD (ROUTINE X 2)     Status: None   Collection Time    02/15/14  3:15 PM      Result Value Ref Range   Specimen Description BLOOD HAND RIGHT     Special Requests BOTTLES DRAWN AEROBIC AND ANAEROBIC 5CC     Culture  Setup Time       Value: 02/15/2014 20:13     Performed at [facility]   Culture       Value:        BLOOD CULTURE RECEIVED NO GROWTH TO DATE CULTURE WILL BE HELD FOR 5 DAYS BEFORE ISSUING A FINAL NEGATIVE REPORT     Performed at [facility]   Report Status PENDING    CULTURE, BLOOD (ROUTINE X 2)     Status: None   Collection Time    02/15/14  3:30 PM      Result Value Ref Range   Specimen Description BLOOD ARM LEFT     Special Requests BOTTLES DRAWN AEROBIC ONLY 5CC     Culture  Setup Time       Value: 02/15/2014 20:12     Performed at [facility]   Culture       Value:        BLOOD CULTURE RECEIVED NO GROWTH TO DATE CULTURE WILL BE HELD FOR 5 DAYS BEFORE ISSUING A FINAL  NEGATIVE REPORT     Performed at [facility]   Report Status PENDING    PROTIME-INR     Status: None   Collection Time    02/15/14  3:39 PM      Result Value Ref Range   Prothrombin Time 15.0  11.6 - 15.2 seconds   INR 1.18  0.00 - 1.49  CANCER ANTIGEN 19-9     Status: Abnormal   Collection Time    02/15/14  6:58 PM      Result Value Ref Range   CA 19-9 7.4 (*) <35.0 U/mL   Comment: Performed at [facility]  CEA     Status: Abnormal   Collection Time    02/15/14  6:58 PM      Result Value Ref Range   CEA 9.0 (*) 0.0 - 5.0 ng/mL   Comment: Performed at [place]     Status: Abnormal   Collection Time    02/16/14  4:15 AM      Result Value Ref Range   Sodium 134 (*) 137 - 147 mEq/L   Potassium 3.6 (*) 3.7 - 5.3 mEq/L   Chloride 95 (*) 96 - 112 mEq/L   CO2 26  19 - 32 mEq/L   Glucose, Bld 116 (*) 70 - 99 mg/dL   BUN 19  6 - 23 mg/dL   Creatinine, Ser 1.21  0.50 - 1.35 mg/dL   Calcium 8.6  8.4 - 10.5 mg/dL  GFR calc non Af Amer 57 (*) >90 mL/min   GFR calc Af Amer 66 (*) >90 mL/min   Comment: (NOTE)     The eGFR has been calculated using the CKD EPI equation.     This calculation has not been validated in all clinical situations.     eGFR's persistently <90 mL/min signify possible Chronic Kidney     Disease.   Anion gap 13  5 - 15  CBC     Status: Abnormal   Collection Time    02/16/14  4:15 AM      Result Value Ref Range   WBC 16.6 (*) 4.0 - 10.5 K/uL   RBC 3.45 (*) 4.22 - 5.81 MIL/uL   Hemoglobin 9.0 (*) 13.0 - 17.0 g/dL   HCT 26.8 (*) 39.0 - 52.0 %   MCV 77.7 (*) 78.0 - 100.0 fL   MCH 26.1  26.0 - 34.0 pg   MCHC 33.6  30.0 - 36.0 g/dL   RDW 14.9  11.5 - 15.5 %   Platelets 303  150 - 400 K/uL  VITAMIN B12     Status: Abnormal   Collection Time    02/16/14 12:13 PM      Result Value Ref Range   Vitamin B-12 1804 (*) 211 - 911 pg/mL   Comment: Performed at [place]     Status: None    Collection Time    02/16/14 12:13 PM      Result Value Ref Range   Folate 8.5     Comment: (NOTE)     Reference Ranges            Deficient:       0.4 - 3.3 ng/mL            Indeterminate:   3.4 - 5.4 ng/mL            Normal:              > 5.4 ng/mL     Performed at [place] [facility]     Status: Abnormal   Collection Time    02/16/14 12:13 PM      Result Value Ref Range   Iron 13 (*) 42 - 135 ug/dL   TIBC 166 (*) 215 - 435 ug/dL   Saturation Ratios 8 (*) 20 - 55 %   UIBC 153  125 - 400 ug/dL   Comment: Performed at [place]     Status: Abnormal   Collection Time    02/16/14 12:13 PM      Result Value Ref Range   Ferritin 440 (*) 22 - 322 ng/mL   Comment: Performed at [place]     Status: Abnormal   Collection Time    02/16/14 12:13 PM      Result Value Ref Range   Retic Ct Pct 1.0  0.4 - 3.1 %   RBC. 3.29 (*) 4.22 - 5.81 MIL/uL   Retic Count, Manual 32.9  19.0 - 186.0 K/uL  BASIC METABOLIC PANEL     Status: Abnormal   Collection Time    02/17/14 12:26 AM      Result Value Ref Range   Sodium 135 (*) 137 - 147 mEq/L   Potassium 3.7  3.7 - 5.3 mEq/L   Chloride 94 (*) 96 - 112 mEq/L   CO2 27  19 - 32 mEq/L   Glucose, Bld 95  70 - 99 mg/dL   BUN 17  6 - 23 mg/dL   Creatinine, Ser 1.31  0.50 - 1.35 mg/dL   Calcium 8.8  8.4 - 10.5 mg/dL   GFR calc non Af Amer 52 (*) >90 mL/min   GFR calc Af Amer 60 (*) >90 mL/min   Comment: (NOTE)     The eGFR has been calculated using the CKD EPI equation.     This calculation has not been validated in all clinical situations.     eGFR's persistently <90 mL/min signify possible Chronic Kidney     Disease.   Anion gap 14  5 - 15  CBC     Status: Abnormal   Collection Time    02/17/14 12:26 AM      Result Value Ref Range   WBC 16.5 (*) 4.0 - 10.5 K/uL   RBC 3.58 (*) 4.22 - 5.81 MIL/uL   Hemoglobin 9.4 (*) 13.0 - 17.0 g/dL   HCT 27.9 (*) 39.0 - 52.0 %   MCV 77.9 (*)  78.0 - 100.0 fL   MCH 26.3  26.0 - 34.0 pg   MCHC 33.7  30.0 - 36.0 g/dL   RDW 14.8  11.5 - 15.5 %   Platelets 348  150 - 400 K/uL  LACTATE DEHYDROGENASE     Status: None   Collection Time    02/17/14 12:26 AM      Result Value Ref Range   LDH 123  94 - 250 U/L  HEPATIC FUNCTION PANEL     Status: Abnormal   Collection Time    02/17/14 12:26 AM      Result Value Ref Range   Total Protein 6.7  6.0 - 8.3 g/dL   Albumin 2.3 (*) 3.5 - 5.2 g/dL   AST 31  0 - 37 U/L   ALT 23  0 - 53 U/L   Alkaline Phosphatase 197 (*) 39 - 117 U/L   Total Bilirubin 1.8 (*) 0.3 - 1.2 mg/dL   Bilirubin, Direct 1.1 (*) 0.0 - 0.3 mg/dL   Indirect Bilirubin 0.7  0.3 - 0.9 mg/dL    Radiology/Results: Dg Chest 2 View  02/15/2014   CLINICAL DATA:  Fever and fatigue, and possible liver abscess ; also body aches and urinary frequency and right lower quadrant abdominal pain for 1 month.  EXAM: CHEST  2 VIEW  COMPARISON:  PA and lateral chest x-ray of February 09, 2014 and February 06, 2014.  FINDINGS: The lungs are adequately inflated. The interstitial markings are mildly increased. There is a new tiny left pleural effusion. The cardiac silhouette is normal in size. The pulmonary vascularity is mildly prominent. There is persistent increased density in the retrocardiac region likely on the right which may reflect developing pneumonia. There is no pneumothorax. The mediastinum is normal in width. The bony thorax is unremarkable.  IMPRESSION: Mildly increased interstitial markings diffusely, and focally increased lung markings likely in the right lower lobe are noted. One cannot exclude early pneumonia. A small left pleural effusion has developed.   Electronically Signed   By: David  Martinique   On: 02/15/2014 13:02    Anti-infectives: Anti-infectives   Start     Dose/Rate Route Frequency Ordered Stop   02/16/14 0000  piperacillin-tazobactam (ZOSYN) IVPB 3.375 g     3.375 g 12.5 mL/hr over 240 Minutes Intravenous 3 times  per day 02/15/14 1516     02/15/14 1600  vancomycin (VANCOCIN) IVPB 1000 mg/200 mL premix  1,000 mg 200 mL/hr over 60 Minutes Intravenous Every 12 hours 02/15/14 1516     02/15/14 1600  piperacillin-tazobactam (ZOSYN) IVPB 3.375 g     3.375 g 100 mL/hr over 30 Minutes Intravenous  Once 02/15/14 1516 02/15/14 1900      Assessment/Plan: Problem List: Patient Active Problem List   Diagnosis Date Noted  . Liver abscess 02/15/2014  . Essential hypertension 02/15/2014  . BPH (benign prostatic hyperplasia) 02/15/2014  . Colonic mass 02/15/2014    Awaiting liver lesion biopsy.  Surgery will follow.  * No surgery found *    LOS: 2 days   Matt B. Hassell Done, MD, [facility] [phone] beeper [phone]  02/17/2014 8:10 AM

## 2014-02-18 DIAGNOSIS — N400 Enlarged prostate without lower urinary tract symptoms: Secondary | ICD-10-CM

## 2014-02-18 LAB — URINALYSIS, ROUTINE W REFLEX MICROSCOPIC
Glucose, UA: NEGATIVE mg/dL
HGB URINE DIPSTICK: NEGATIVE
KETONES UR: NEGATIVE mg/dL
Leukocytes, UA: NEGATIVE
Nitrite: NEGATIVE
PROTEIN: NEGATIVE mg/dL
Specific Gravity, Urine: 1.026 (ref 1.005–1.030)
UROBILINOGEN UA: 4 mg/dL — AB (ref 0.0–1.0)
pH: 6 (ref 5.0–8.0)

## 2014-02-18 LAB — VANCOMYCIN, TROUGH: Vancomycin Tr: 22.3 ug/mL — ABNORMAL HIGH (ref 10.0–20.0)

## 2014-02-18 MED ORDER — SODIUM CHLORIDE 0.9 % IV SOLN
INTRAVENOUS | Status: AC
  Administered 2014-02-18: 12:00:00 via INTRAVENOUS

## 2014-02-18 MED ORDER — VANCOMYCIN HCL IN DEXTROSE 750-5 MG/150ML-% IV SOLN
750.0000 mg | Freq: Two times a day (BID) | INTRAVENOUS | Status: DC
  Administered 2014-02-18 – 2014-02-24 (×12): 750 mg via INTRAVENOUS
  Filled 2014-02-18 (×15): qty 150

## 2014-02-18 NOTE — Progress Notes (Signed)
Patient ID: Brent Little, male   DOB: Jan 29, 1941, 73 y.o.   MRN: [id number] Patient awaiting IR biopsy vs aspiration on Monday.  We will continue to follow along.  Recommendations to be made after above procedure complete.  Donnavan Covault E 8:52 AM 02/18/2014

## 2014-02-18 NOTE — Progress Notes (Signed)
Did not see today.  Awaiting percutaneous liver biopsy.  Kathryne Eriksson. Dahlia Bailiff, MD, [place] [phone] [facility]

## 2014-02-18 NOTE — Progress Notes (Signed)
ANTIBIOTIC CONSULT NOTE - FOLLOW UP  Pharmacy Consult for Vancomycin Indication: PNA, Liver abscess  No Known Allergies  Patient Measurements: Height: 6' (182.9 cm) Weight: 172 lb 8 oz (78.245 kg) IBW/kg (Calculated) : 77.6 Adjusted Body Weight:    Vital Signs: Temp: 99 F (37.2 C) (10/11 1429) Temp Source: Axillary (10/11 1429) BP: 131/72 mmHg (10/11 1429) Pulse Rate: 68 (10/11 1429) Intake/Output from previous day: 10/10 0701 - 10/11 0700 In: 600 [P.O.:600] Out: 200 [Urine:200] Intake/Output from this shift: Total I/O In: 240 [P.O.:240] Out: -   Labs:  Recent Labs  02/16/14 0415 02/17/14 0026  WBC 16.6* 16.5*  HGB 9.0* 9.4*  PLT 303 348  CREATININE 1.21 1.31   Estimated Creatinine Clearance: 54.3 ml/min (by C-G formula based on Cr of 1.31).  Recent Labs  02/18/14 1600  VANCOTROUGH 22.3*     Microbiology: Recent Results (from the past 720 hour(s))  CULTURE, BLOOD (ROUTINE X 2)     Status: None   Collection Time    02/15/14  3:15 PM      Result Value Ref Range Status   Specimen Description BLOOD HAND RIGHT   Final   Special Requests BOTTLES DRAWN AEROBIC AND ANAEROBIC 5CC   Final   Culture  Setup Time     Final   Value: 02/15/2014 20:13     Performed at [facility]   Culture     Final   Value:        BLOOD CULTURE RECEIVED NO GROWTH TO DATE CULTURE WILL BE HELD FOR 5 DAYS BEFORE ISSUING A FINAL NEGATIVE REPORT     Performed at [facility]   Report Status PENDING   Incomplete  CULTURE, BLOOD (ROUTINE X 2)     Status: None   Collection Time    02/15/14  3:30 PM      Result Value Ref Range Status   Specimen Description BLOOD ARM LEFT   Final   Special Requests BOTTLES DRAWN AEROBIC ONLY 5CC   Final   Culture  Setup Time     Final   Value: 02/15/2014 20:12     Performed at [facility]   Culture     Final   Value:        BLOOD CULTURE RECEIVED NO GROWTH TO DATE CULTURE WILL BE HELD FOR 5 DAYS BEFORE ISSUING A FINAL NEGATIVE  REPORT     Performed at [facility]   Report Status PENDING   Incomplete    Anti-infectives   Start     Dose/Rate Route Frequency Ordered Stop   02/16/14 0000  piperacillin-tazobactam (ZOSYN) IVPB 3.375 g     3.375 g 12.5 mL/hr over 240 Minutes Intravenous 3 times per day 02/15/14 1516     02/15/14 1600  vancomycin (VANCOCIN) IVPB 1000 mg/200 mL premix     1,000 mg 200 mL/hr over 60 Minutes Intravenous Every 12 hours 02/15/14 1516     02/15/14 1600  piperacillin-tazobactam (ZOSYN) IVPB 3.375 g     3.375 g 100 mL/hr over 30 Minutes Intravenous  Once 02/15/14 1516 02/15/14 1900      Assessment: 73yom who had an MRI Tuesday which showed possible liver abscess vs necrotic tumor. Presents to the ED today with fever, body aches, and abdominal pain. CXR cannot exclude early pneumonia.   Anticoagulation: Hgb 9.4 stable. None currently. SCDs. LMWH on hold for bx.  Infectious Disease: PNA, Liver abscess.Tmax 99. WBC 16.5 unchanged. Watch Scr up 1.31  10/8  Vanco>> --10/11: VT: 22.3 10/8 Zosyn>>  10/8: BC x 2>>   Goal of Therapy:  Vancomycin trough level 15-20 mcg/ml  Plan:  1. Decrease Vancomycin to 750mg  IV q12hrs. 2. Zosyn 3.375g IV q8hr. 3.  IR biopsy/aspiration of liver lesion; plan 10/12.  Brent Little, PharmD, BCPS Clinical Staff Pharmacist Pager [phone]  Brent Little 02/18/2014,5:23 PM

## 2014-02-18 NOTE — Progress Notes (Signed)
[facility] PROGRESS NOTE  Brent Little NFA:[id number] DOB: [birth year] DOA: 02/15/2014 PCP: [name], MD  Assessment/Plan: 1. 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 Ascending colon mass -never had colonoscopy, timing per GI -CEA mildly elevated  3. HTN -stable, ACE on hold  4. Anemia -anemia panel c/w Iron deficiency  5. ? Dysuria -check UA  DVT proph: lovenox on hold for biopsy  Code Status: Full Code Family Communication: wife at bedside Disposition Plan: pending workup   Consultants:  GI  CCS  IR  HPI/Subjective: Complains of R sided abd pain, overall better  Objective: Filed Vitals:   02/18/14 0500  BP: 124/56  Pulse: 52  Temp: 98.1 F (36.7 C)  Resp: 18    Intake/Output Summary (Last 24 hours) at 02/18/14 1146 Last data filed at 02/17/14 1846  Gross per 24 hour  Intake    360 ml  Output      0 ml  Net    360 ml   Filed Weights   02/16/14 0350 02/17/14 0405 02/18/14 0500  Weight: 81 kg (178 lb 9.2 oz) 78.155 kg (172 lb 4.8 oz) 78.245 kg (172 lb 8 oz)    Exam:   General:  AAOx3, more comfortable   Cardiovascular: S1S2/RRR  Respiratory: CTAB  Abdomen: soft, mild tenderness in RUQ, BS present  Musculoskeletal: no edema c/c   Data Reviewed: Basic Metabolic Panel:  Recent Labs Lab 02/15/14 1243 02/16/14 0415 02/17/14 0026  NA 135* 134* 135*  K 3.3* 3.6* 3.7  CL 95* 95* 94*  CO2 26 26 27   GLUCOSE 104* 116* 95  BUN 18 19 17   CREATININE 1.09 1.21 1.31  CALCIUM 8.7 8.6 8.8   Liver Function Tests:  Recent Labs Lab 02/15/14 1243 02/17/14 0026  AST 42* 31  ALT 31 23  ALKPHOS 209* 197*  BILITOT 0.7 1.8*  PROT 6.3 6.7  ALBUMIN 2.1* 2.3*   No results found for this basename: LIPASE, AMYLASE,  in the last 168 hours No results found for this basename: AMMONIA,  in the last 168 hours CBC:  Recent Labs Lab  02/15/14 1243 02/16/14 0415 02/17/14 0026  WBC 17.8* 16.6* 16.5*  NEUTROABS 15.1*  --   --   HGB 8.8* 9.0* 9.4*  HCT 26.1* 26.8* 27.9*  MCV 77.4* 77.7* 77.9*  PLT 302 303 348   Cardiac Enzymes: No results found for this basename: CKTOTAL, CKMB, CKMBINDEX, TROPONINI,  in the last 168 hours BNP (last 3 results) No results found for this basename: PROBNP,  in the last 8760 hours CBG: No results found for this basename: GLUCAP,  in the last 168 hours  Recent Results (from the past 240 hour(s))  CULTURE, BLOOD (ROUTINE X 2)     Status: None   Collection Time    02/15/14  3:15 PM      Result Value Ref Range Status   Specimen Description BLOOD HAND RIGHT   Final   Special Requests BOTTLES DRAWN AEROBIC AND ANAEROBIC 5CC   Final   Culture  Setup Time     Final   Value: 02/15/2014 20:13     Performed at [facility]   Culture     Final   Value:        BLOOD CULTURE RECEIVED NO GROWTH TO DATE CULTURE WILL BE HELD FOR 5 DAYS BEFORE ISSUING A FINAL NEGATIVE REPORT     Performed  at [facility]   Report Status PENDING   Incomplete  CULTURE, BLOOD (ROUTINE X 2)     Status: None   Collection Time    02/15/14  3:30 PM      Result Value Ref Range Status   Specimen Description BLOOD ARM LEFT   Final   Special Requests BOTTLES DRAWN AEROBIC ONLY 5CC   Final   Culture  Setup Time     Final   Value: 02/15/2014 20:12     Performed at [facility]   Culture     Final   Value:        BLOOD CULTURE RECEIVED NO GROWTH TO DATE CULTURE WILL BE HELD FOR 5 DAYS BEFORE ISSUING A FINAL NEGATIVE REPORT     Performed at [facility]   Report Status PENDING   Incomplete     Studies: No results found.  Scheduled Meds: . Influenza vac split quadrivalent PF  0.5 mL Intramuscular Tomorrow-1000  . piperacillin-tazobactam (ZOSYN)  IV  3.375 g Intravenous 3 times per day  . pneumococcal 23 valent vaccine  0.5 mL Intramuscular Tomorrow-1000  . terazosin  5 mg Oral Daily  .  vancomycin  1,000 mg Intravenous Q12H   Continuous Infusions: . sodium chloride     Antibiotics Given (last 72 hours)   Date/Time Action Medication Dose Rate   02/15/14 1830 Given   piperacillin-tazobactam (ZOSYN) IVPB 3.375 g 3.375 g 100 mL/hr   02/15/14 2353 Given   piperacillin-tazobactam (ZOSYN) IVPB 3.375 g 3.375 g 12.5 mL/hr   02/16/14 0820 Given   piperacillin-tazobactam (ZOSYN) IVPB 3.375 g 3.375 g 12.5 mL/hr   02/16/14 1705 Given   piperacillin-tazobactam (ZOSYN) IVPB 3.375 g 3.375 g 12.5 mL/hr   02/17/14 0033 Given   piperacillin-tazobactam (ZOSYN) IVPB 3.375 g 3.375 g 12.5 mL/hr   02/17/14 1100 Given   piperacillin-tazobactam (ZOSYN) IVPB 3.375 g 3.375 g 12.5 mL/hr   02/17/14 1700 Given   piperacillin-tazobactam (ZOSYN) IVPB 3.375 g 3.375 g 12.5 mL/hr   02/18/14 0044 Given   piperacillin-tazobactam (ZOSYN) IVPB 3.375 g 3.375 g 12.5 mL/hr   02/18/14 0800 Given   piperacillin-tazobactam (ZOSYN) IVPB 3.375 g 3.375 g 12.5 mL/hr      Principal Problem:   Essential hypertension Active Problems:   Liver abscess   BPH (benign prostatic hyperplasia)   Colonic mass    Time spent: 68min    Brent Little  [facility] Pager [phone]. If 7PM-7AM, please contact night-coverage at [email or web address], password [facility] 02/18/2014, 11:46 AM  LOS: 3 days

## 2014-02-19 ENCOUNTER — Inpatient Hospital Stay (HOSPITAL_COMMUNITY): Payer: Medicare Other

## 2014-02-19 LAB — CBC
HCT: 25.6 % — ABNORMAL LOW (ref 39.0–52.0)
Hemoglobin: 8.6 g/dL — ABNORMAL LOW (ref 13.0–17.0)
MCH: 26.1 pg (ref 26.0–34.0)
MCHC: 33.6 g/dL (ref 30.0–36.0)
MCV: 77.8 fL — ABNORMAL LOW (ref 78.0–100.0)
Platelets: 395 K/uL (ref 150–400)
RBC: 3.29 MIL/uL — ABNORMAL LOW (ref 4.22–5.81)
RDW: 15 % (ref 11.5–15.5)
WBC: 14 K/uL — ABNORMAL HIGH (ref 4.0–10.5)

## 2014-02-19 LAB — PROTIME-INR
INR: 1.41 (ref 0.00–1.49)
PROTHROMBIN TIME: 17.3 s — AB (ref 11.6–15.2)

## 2014-02-19 LAB — COMPREHENSIVE METABOLIC PANEL
ALBUMIN: 2 g/dL — AB (ref 3.5–5.2)
ALT: 19 U/L (ref 0–53)
AST: 30 U/L (ref 0–37)
Alkaline Phosphatase: 164 U/L — ABNORMAL HIGH (ref 39–117)
Anion gap: 13 (ref 5–15)
BILIRUBIN TOTAL: 1.3 mg/dL — AB (ref 0.3–1.2)
BUN: 15 mg/dL (ref 6–23)
CALCIUM: 8.6 mg/dL (ref 8.4–10.5)
CO2: 26 mEq/L (ref 19–32)
Chloride: 99 mEq/L (ref 96–112)
Creatinine, Ser: 1.32 mg/dL (ref 0.50–1.35)
GFR calc Af Amer: 60 mL/min — ABNORMAL LOW (ref >90)
GFR calc non Af Amer: 51 mL/min — ABNORMAL LOW (ref >90)
Glucose, Bld: 105 mg/dL — ABNORMAL HIGH (ref 70–99)
Potassium: 3.5 mEq/L — ABNORMAL LOW (ref 3.7–5.3)
Sodium: 138 mEq/L (ref 137–147)
TOTAL PROTEIN: 6 g/dL (ref 6.0–8.3)

## 2014-02-19 LAB — GRAM STAIN: Special Requests: NORMAL

## 2014-02-19 LAB — APTT: aPTT: 46 s — ABNORMAL HIGH (ref 24–37)

## 2014-02-19 LAB — PSA: PSA: 8.19 ng/mL — ABNORMAL HIGH

## 2014-02-19 MED ORDER — MIDAZOLAM HCL 2 MG/2ML IJ SOLN
INTRAMUSCULAR | Status: AC
  Filled 2014-02-19: qty 2

## 2014-02-19 MED ORDER — FENTANYL CITRATE 0.05 MG/ML IJ SOLN
INTRAMUSCULAR | Status: AC | PRN
  Administered 2014-02-19: 25 ug via INTRAVENOUS

## 2014-02-19 MED ORDER — MIDAZOLAM HCL 2 MG/2ML IJ SOLN
INTRAMUSCULAR | Status: AC | PRN
  Administered 2014-02-19: 1 mg via INTRAVENOUS

## 2014-02-19 MED ORDER — GELATIN ABSORBABLE 12-7 MM EX MISC
CUTANEOUS | Status: DC
Start: 2014-02-19 — End: 2014-02-19
  Filled 2014-02-19: qty 1

## 2014-02-19 MED ORDER — LIDOCAINE HCL (PF) 1 % IJ SOLN
INTRAMUSCULAR | Status: AC
  Filled 2014-02-19: qty 10

## 2014-02-19 MED ORDER — FENTANYL CITRATE 0.05 MG/ML IJ SOLN
INTRAMUSCULAR | Status: AC
  Filled 2014-02-19: qty 2

## 2014-02-19 NOTE — Progress Notes (Signed)
Patient ID: Brent Little, male   DOB: 08-Jul-1940, 73 y.o.   MRN: [id number]    Subjective: Pt seems a bit grumpy.  Other than that he has abdominal pain when he coughs only.  Objective: Vital signs in last 24 hours: Temp:  [97.9 F (36.6 C)-99.2 F (37.3 C)] 99.2 F (37.3 C) (10/12 0428) Pulse Rate:  [63-87] 87 (10/12 0428) Resp:  [16-18] 16 (10/12 0428) BP: (113-131)/(43-72) 123/43 mmHg (10/12 0428) SpO2:  [95 %-96 %] 95 % (10/12 0428) Weight:  [170 lb 10.2 oz (77.4 kg)] 170 lb 10.2 oz (77.4 kg) (10/12 0423) Last BM Date: 02/14/14  Intake/Output from previous day: 10/11 0701 - 10/12 0700 In: 240 [P.O.:240] Out: 300 [Urine:300] Intake/Output this shift:    PE: Abd: soft, mild right sided tenderness, +BS, ND  Lab Results:   Recent Labs  02/17/14 0026 02/19/14 0550  WBC 16.5* 14.0*  HGB 9.4* 8.6*  HCT 27.9* 25.6*  PLT 348 395   BMET  Recent Labs  02/17/14 0026 02/19/14 0550  NA 135* 138  K 3.7 3.5*  CL 94* 99  CO2 27 26  GLUCOSE 95 105*  BUN 17 15  CREATININE 1.31 1.32  CALCIUM 8.8 8.6   PT/INR  Recent Labs  02/19/14 0550  LABPROT 17.3*  INR 1.41   CMP     Component Value Date/Time   NA 138 02/19/2014 0550   K 3.5* 02/19/2014 0550   CL 99 02/19/2014 0550   CO2 26 02/19/2014 0550   GLUCOSE 105* 02/19/2014 0550   BUN 15 02/19/2014 0550   CREATININE 1.32 02/19/2014 0550   CALCIUM 8.6 02/19/2014 0550   PROT 6.0 02/19/2014 0550   ALBUMIN 2.0* 02/19/2014 0550   AST 30 02/19/2014 0550   ALT 19 02/19/2014 0550   ALKPHOS 164* 02/19/2014 0550   BILITOT 1.3* 02/19/2014 0550   GFRNONAA 51* 02/19/2014 0550   GFRAA 60* 02/19/2014 0550   Lipase  No results found for this basename: lipase       Studies/Results: No results found.  Anti-infectives: Anti-infectives   Start     Dose/Rate Route Frequency Ordered Stop   02/18/14 1800  vancomycin (VANCOCIN) IVPB 750 mg/150 ml premix     750 mg 150 mL/hr over 60 Minutes Intravenous Every 12 hours  02/18/14 1726     02/16/14 0000  piperacillin-tazobactam (ZOSYN) IVPB 3.375 g     3.375 g 12.5 mL/hr over 240 Minutes Intravenous 3 times per day 02/15/14 1516     02/15/14 1600  vancomycin (VANCOCIN) IVPB 1000 mg/200 mL premix  Status:  Discontinued     1,000 mg 200 mL/hr over 60 Minutes Intravenous Every 12 hours 02/15/14 1516 02/18/14 1726   02/15/14 1600  piperacillin-tazobactam (ZOSYN) IVPB 3.375 g     3.375 g 100 mL/hr over 30 Minutes Intravenous  Once 02/15/14 1516 02/15/14 1900       Assessment/Plan  1. Possible right colon mass 2. Liver lesion, abscess vs met 3. Leukocytosis  Plan: 1. Plan for CT guided biopsy vs aspiration today.  Will await results of this.   2. Patient may require colonoscopy.   LOS: 4 days    Anand Tejada E 02/19/2014, 9:00 AM Pager: [phone]

## 2014-02-19 NOTE — Progress Notes (Signed)
S:  RUQ pn w/ pleuritic component not improved; still needing pn med.    O:  Afebrile.  WBC gradually decreasing (currently 14K).  These both reflect improvements since going on antbx therapy.  Abd is w/out signif RUQ or epig tenderness.  A:  Awaiting aspiration of liver lesion, the results of which will direct further evaluation and therapy. Overall, I lean toward abscess rather than necrotic tumor as the source of his problems.   P: As noted by Dr. Paulita Fujita, if no dx of malig is forthcoming on liver lesion aspirate, pt would need colonoscopy for eval of possible lesion in asc colon reportedly seen on CT done elsewhere. I explained this to family.  Cleotis Nipper, M.D. [phone]

## 2014-02-19 NOTE — Progress Notes (Signed)
[facility] PROGRESS NOTE  Brent Little RSW:[id number] DOB: [birth year] DOA: 02/15/2014 PCP: [name], MD  Assessment/Plan: 1. Liver abscess vs necrotic mets  -continue current Abx-Vanc/Zosyn -afebrile today -Blood CX-NGTD -CT guided biopsy per IR for today  2. Ascending colon mass -never had colonoscopy, needs this, timing per GI -CEA mildly elevated  3. HTN -stable, ACE on hold  4. Anemia -anemia panel c/w Iron deficiency  5. ? Dysuria -check UA  DVT proph: lovenox on hold for biopsy  Code Status: Full Code Family Communication: wife at bedside Disposition Plan: pending workup   Consultants:  GI  CCS  IR  HPI/Subjective: Complains of R sided abd pain, overall better  Objective: Filed Vitals:   02/19/14 1204  BP: 117/61  Pulse: 66  Temp:   Resp: 24    Intake/Output Summary (Last 24 hours) at 02/19/14 1211 Last data filed at 02/19/14 0900  Gross per 24 hour  Intake    120 ml  Output    300 ml  Net   -180 ml   Filed Weights   02/17/14 0405 02/18/14 0500 02/19/14 0423  Weight: 78.155 kg (172 lb 4.8 oz) 78.245 kg (172 lb 8 oz) 77.4 kg (170 lb 10.2 oz)    Exam:   General:  AAOx3, more comfortable   Cardiovascular: S1S2/RRR  Respiratory: CTAB  Abdomen: soft, mild tenderness in RUQ, BS present  Musculoskeletal: no edema c/c   Data Reviewed: Basic Metabolic Panel:  Recent Labs Lab 02/15/14 1243 02/16/14 0415 02/17/14 0026 02/19/14 0550  NA 135* 134* 135* 138  K 3.3* 3.6* 3.7 3.5*  CL 95* 95* 94* 99  CO2 26 26 27 26   GLUCOSE 104* 116* 95 105*  BUN 18 19 17 15   CREATININE 1.09 1.21 1.31 1.32  CALCIUM 8.7 8.6 8.8 8.6   Liver Function Tests:  Recent Labs Lab 02/15/14 1243 02/17/14 0026 02/19/14 0550  AST 42* 31 30  ALT 31 23 19   ALKPHOS 209* 197* 164*  BILITOT 0.7 1.8* 1.3*  PROT 6.3 6.7 6.0  ALBUMIN 2.1* 2.3* 2.0*   No results found for this basename: LIPASE, AMYLASE,  in the last 168 hours No results found  for this basename: AMMONIA,  in the last 168 hours CBC:  Recent Labs Lab 02/15/14 1243 02/16/14 0415 02/17/14 0026 02/19/14 0550  WBC 17.8* 16.6* 16.5* 14.0*  NEUTROABS 15.1*  --   --   --   HGB 8.8* 9.0* 9.4* 8.6*  HCT 26.1* 26.8* 27.9* 25.6*  MCV 77.4* 77.7* 77.9* 77.8*  PLT 302 303 348 395   Cardiac Enzymes: No results found for this basename: CKTOTAL, CKMB, CKMBINDEX, TROPONINI,  in the last 168 hours BNP (last 3 results) No results found for this basename: PROBNP,  in the last 8760 hours CBG: No results found for this basename: GLUCAP,  in the last 168 hours  Recent Results (from the past 240 hour(s))  CULTURE, BLOOD (ROUTINE X 2)     Status: None   Collection Time    02/15/14  3:15 PM      Result Value Ref Range Status   Specimen Description BLOOD HAND RIGHT   Final   Special Requests BOTTLES DRAWN AEROBIC AND ANAEROBIC 5CC   Final   Culture  Setup Time     Final   Value: 02/15/2014 20:13     Performed at [facility]   Culture     Final   Value:        BLOOD  CULTURE RECEIVED NO GROWTH TO DATE CULTURE WILL BE HELD FOR 5 DAYS BEFORE ISSUING A FINAL NEGATIVE REPORT     Performed at [facility]   Report Status PENDING   Incomplete  CULTURE, BLOOD (ROUTINE X 2)     Status: None   Collection Time    02/15/14  3:30 PM      Result Value Ref Range Status   Specimen Description BLOOD ARM LEFT   Final   Special Requests BOTTLES DRAWN AEROBIC ONLY 5CC   Final   Culture  Setup Time     Final   Value: 02/15/2014 20:12     Performed at [facility]   Culture     Final   Value:        BLOOD CULTURE RECEIVED NO GROWTH TO DATE CULTURE WILL BE HELD FOR 5 DAYS BEFORE ISSUING A FINAL NEGATIVE REPORT     Performed at [facility]   Report Status PENDING   Incomplete     Studies: No results found.  Scheduled Meds: . fentaNYL      . Influenza vac split quadrivalent PF  0.5 mL Intramuscular Tomorrow-1000  . lidocaine (PF)      . midazolam       . piperacillin-tazobactam (ZOSYN)  IV  3.375 g Intravenous 3 times per day  . pneumococcal 23 valent vaccine  0.5 mL Intramuscular Tomorrow-1000  . terazosin  5 mg Oral Daily  . vancomycin  750 mg Intravenous Q12H   Continuous Infusions:   Antibiotics Given (last 72 hours)   Date/Time Action Medication Dose Rate   02/16/14 1705 Given   piperacillin-tazobactam (ZOSYN) IVPB 3.375 g 3.375 g 12.5 mL/hr   02/17/14 0033 Given   piperacillin-tazobactam (ZOSYN) IVPB 3.375 g 3.375 g 12.5 mL/hr   02/17/14 1100 Given   piperacillin-tazobactam (ZOSYN) IVPB 3.375 g 3.375 g 12.5 mL/hr   02/17/14 1700 Given   piperacillin-tazobactam (ZOSYN) IVPB 3.375 g 3.375 g 12.5 mL/hr   02/18/14 0044 Given   piperacillin-tazobactam (ZOSYN) IVPB 3.375 g 3.375 g 12.5 mL/hr   02/18/14 0800 Given   piperacillin-tazobactam (ZOSYN) IVPB 3.375 g 3.375 g 12.5 mL/hr   02/18/14 1730 Given   piperacillin-tazobactam (ZOSYN) IVPB 3.375 g 3.375 g 12.5 mL/hr   02/18/14 1752 Given   vancomycin (VANCOCIN) IVPB 750 mg/150 ml premix 750 mg 150 mL/hr   02/19/14 0110 Given   piperacillin-tazobactam (ZOSYN) IVPB 3.375 g 3.375 g 12.5 mL/hr   02/19/14 0656 Given   vancomycin (VANCOCIN) IVPB 750 mg/150 ml premix 750 mg 150 mL/hr   02/19/14 0826 Given   piperacillin-tazobactam (ZOSYN) IVPB 3.375 g 3.375 g 12.5 mL/hr      Principal Problem:   Essential hypertension Active Problems:   Liver abscess   BPH (benign prostatic hyperplasia)   Colonic mass    Time spent: 29min    Geoff Dacanay  [facility] Pager [phone]. If 7PM-7AM, please contact night-coverage at [email or web address], password [facility] 02/19/2014, 12:11 PM  LOS: 4 days

## 2014-02-19 NOTE — Progress Notes (Addendum)
General surgery attending:  Patient interviewed and examined. Agree with above.  Apparently ultrasound guided drainage of liver yield a significant volume of purulent fluid.   he remains stable this evening.  Await decision regarding colonoscopy. He is aware that eventually this will need to be done to assess right colon thickening and possible mass.    Brent Little. Dalbert Batman, M.D., [facility] General and Minimally invasive Surgery Breast and Colorectal Surgery

## 2014-02-19 NOTE — Procedures (Signed)
Interventional Radiology Procedure Note  Procedure: US guided aspiration of complex right hepatic fluid collection yielded purulent appearing material.  Therefore, a 68F drain was placed under US guidance.  A total of 115 mL purulent material ultimately collected.  Sent for both cytology and culture. Complications: None  Recommendations: - Path pending - Cx pending - Continue abx, watch for sepsis  Signed,  Criselda Peaches, MD

## 2014-02-20 LAB — URINALYSIS, ROUTINE W REFLEX MICROSCOPIC
Bilirubin Urine: NEGATIVE
Glucose, UA: NEGATIVE mg/dL
Hgb urine dipstick: NEGATIVE
Ketones, ur: NEGATIVE mg/dL
Leukocytes, UA: NEGATIVE
NITRITE: NEGATIVE
Protein, ur: NEGATIVE mg/dL
SPECIFIC GRAVITY, URINE: 1.021 (ref 1.005–1.030)
UROBILINOGEN UA: 1 mg/dL (ref 0.0–1.0)
pH: 7 (ref 5.0–8.0)

## 2014-02-20 LAB — AFP TUMOR MARKER: AFP-Tumor Marker: 1.8 ng/mL (ref <6.1)

## 2014-02-20 MED ORDER — HYDROMORPHONE HCL 1 MG/ML IJ SOLN
1.0000 mg | Freq: Once | INTRAMUSCULAR | Status: AC
  Administered 2014-02-20: 1 mg via INTRAVENOUS
  Filled 2014-02-20: qty 1

## 2014-02-20 MED ORDER — MORPHINE SULFATE 2 MG/ML IJ SOLN
2.0000 mg | INTRAMUSCULAR | Status: DC | PRN
Start: 2014-02-20 — End: 2014-02-23
  Filled 2014-02-20 (×2): qty 1

## 2014-02-20 MED ORDER — MORPHINE SULFATE 4 MG/ML IJ SOLN
4.0000 mg | Freq: Once | INTRAMUSCULAR | Status: AC
  Administered 2014-02-20: 4 mg via INTRAVENOUS
  Filled 2014-02-20: qty 1

## 2014-02-20 NOTE — Care Management Note (Signed)
    Page 1 of 1   03/09/2014     5:01:15 PM CARE MANAGEMENT NOTE 03/09/2014  Patient:  Brent Little,Brent Little   Account Number:  [id number]  Date Initiated:  02/16/2014  Documentation initiated by:  Lorne Skeens  Subjective/Objective Assessment:   Patient was admitted with liver abscess vs mass. Lives at home with spouse.     Action/Plan:   Will follow for discharge needs   Anticipated DC Date:  03/09/2014   Anticipated DC Plan:  SKILLED NURSING FACILITY  In-house referral  Clinical Social Worker      DC Planning Services  CM consult      Choice offered to / List presented to:             Status of service:  Completed, signed off Medicare Important Message given?  YES (If response is "NO", the following Medicare IM given date fields will be blank) Date Medicare IM given:  03/05/2014 Medicare IM given by:  MAYO,HENRIETTA Date Additional Medicare IM given:  03/09/2014 Additional Medicare IM given by:  Esperanza Madrazo  Discharge Disposition:    Per UR Regulation:  Reviewed for med. necessity/level of care/duration of stay  If discussed at [place] of Stay Meetings, dates discussed:   02/20/2014  02/22/2014  02/27/2014    Comments:  03/09/14 Ellan Lambert, RN, BSN [phone] Pt discharging to SNF today, per CSW arrangements.  02/28/14 Ellan Lambert, RN, BSN [phone] PT recommending SNF at dc; CSW following to facilitate dc to SNF when medically stable.  Will follow progress.

## 2014-02-20 NOTE — Progress Notes (Signed)
02/20/2014 12:26 AM Progress Notes Patient's surgical site was continuing to cause him pain. The 1 mg of morphine IV given at 2324 did not relieve him of the pain. I informed the hospitalist and asked if I could give him a bigger dose of morphine or another pain medicine. The hospitalist informed me she was ordering a one time dose of morphine 4 mg IV and changing his PRN morphine to 2 mg IV every 3 hours as needed for severe pain. Will continue to monitor patient. Brent Little

## 2014-02-20 NOTE — Progress Notes (Signed)
[facility] PROGRESS NOTE  Brent Little WEX:[id number] DOB: 1941-05-08 DOA: 02/15/2014 PCP: [name], MD  Brief Narrative: Brent Little is a 73 y.o. male, With H/O HTN, Kidney stone, who initially presented to [facility] about one week ago with fever, dull right upper quadrant pain and a dry cough. He was diagnosed there with a possible community-acquired pneumonia, was placed on Levaquin, he had marginal improvement. During his fever workup he had a right upper quadrant ultrasound which showed some nonspecific changes . He was then discharged home with outpatient MRI, MRI done in the outpatient setting showed a septated cystic lesion in the right hepatic lobe concerning for abscess versus metastatic necrotic mass. MRI also showed an ascending colon circumferential mass. He presented to [facility] with fever, chills, decreased appetite and lethargy. Now being followed by [facility]/GI and IR. Has been on broad spectrum Abx, finally underwent Liver biopsy 10/12, last night severe pain and got narcotics 3times last night now confused.  Assessment/Plan: 1. 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 A total of 115 mL purulent material ultimately collected -Culture-gram stain-no organisms seen, FU cultures -afebrile today -Blood CX-NGTD  2. Ascending colon mass -never had colonoscopy, needs this, timing per GI -CEA mildly elevated  3. HTN -stable, ACE on hold  4. Anemia -anemia panel c/w Iron deficiency  5. ? Dysuria yesterday -UA benign  6. Confusion/encephalopathy -received 2doses of IV morphine and 2tabs of vicodin overnight -stop Vicodin, hold sedating meds till awake  DVT proph: lovenox was on hold for biopsy, resume this tomorrow  Code Status: Full Code Family Communication: wife at bedside Disposition Plan: pending  workup   Consultants:  GI  CCS  IR  HPI/Subjective: Complains of R sided abd pain, confused this morning  Objective: Filed Vitals:   02/20/14 1010  BP: 149/64  Pulse: 87  Temp: 98.4 F (36.9 C)  Resp: 20    Intake/Output Summary (Last 24 hours) at 02/20/14 1256 Last data filed at 02/20/14 1146  Gross per 24 hour  Intake      5 ml  Output     80 ml  Net    -75 ml   Filed Weights   02/18/14 0500 02/19/14 0423 02/20/14 0430  Weight: 78.245 kg (172 lb 8 oz) 77.4 kg (170 lb 10.2 oz) 78.2 kg (172 lb 6.4 oz)    Exam:   General:  Confused, sleepy, arousible but drowsy   Cardiovascular: S1S2/RRR  Respiratory: CTAB  Abdomen: soft, mild tenderness in RUQ, BS present  Musculoskeletal: no edema c/c   Data Reviewed: Basic Metabolic Panel:  Recent Labs Lab 02/15/14 1243 02/16/14 0415 02/17/14 0026 02/19/14 0550  NA 135* 134* 135* 138  K 3.3* 3.6* 3.7 3.5*  CL 95* 95* 94* 99  CO2 26 26 27 26   GLUCOSE 104* 116* 95 105*  BUN 18 19 17 15   CREATININE 1.09 1.21 1.31 1.32  CALCIUM 8.7 8.6 8.8 8.6   Liver Function Tests:  Recent Labs Lab 02/15/14 1243 02/17/14 0026 02/19/14 0550  AST 42* 31 30  ALT 31 23 19   ALKPHOS 209* 197* 164*  BILITOT 0.7 1.8* 1.3*  PROT 6.3 6.7 6.0  ALBUMIN 2.1* 2.3* 2.0*   No results found for this basename: LIPASE, AMYLASE,  in the last 168 hours No results found for this basename: AMMONIA,  in the last 168 hours CBC:  Recent  Labs Lab 02/15/14 1243 02/16/14 0415 02/17/14 0026 02/19/14 0550  WBC 17.8* 16.6* 16.5* 14.0*  NEUTROABS 15.1*  --   --   --   HGB 8.8* 9.0* 9.4* 8.6*  HCT 26.1* 26.8* 27.9* 25.6*  MCV 77.4* 77.7* 77.9* 77.8*  PLT 302 303 348 395   Cardiac Enzymes: No results found for this basename: CKTOTAL, CKMB, CKMBINDEX, TROPONINI,  in the last 168 hours BNP (last 3 results) No results found for this basename: PROBNP,  in the last 8760 hours CBG: No results found for this basename: GLUCAP,  in the last  168 hours  Recent Results (from the past 240 hour(s))  CULTURE, BLOOD (ROUTINE X 2)     Status: None   Collection Time    02/15/14  3:15 PM      Result Value Ref Range Status   Specimen Description BLOOD HAND RIGHT   Final   Special Requests BOTTLES DRAWN AEROBIC AND ANAEROBIC 5CC   Final   Culture  Setup Time     Final   Value: 02/15/2014 20:13     Performed at [facility]   Culture     Final   Value:        BLOOD CULTURE RECEIVED NO GROWTH TO DATE CULTURE WILL BE HELD FOR 5 DAYS BEFORE ISSUING A FINAL NEGATIVE REPORT     Performed at [facility]   Report Status PENDING   Incomplete  CULTURE, BLOOD (ROUTINE X 2)     Status: None   Collection Time    02/15/14  3:30 PM      Result Value Ref Range Status   Specimen Description BLOOD ARM LEFT   Final   Special Requests BOTTLES DRAWN AEROBIC ONLY 5CC   Final   Culture  Setup Time     Final   Value: 02/15/2014 20:12     Performed at [facility]   Culture     Final   Value:        BLOOD CULTURE RECEIVED NO GROWTH TO DATE CULTURE WILL BE HELD FOR 5 DAYS BEFORE ISSUING A FINAL NEGATIVE REPORT     Performed at [facility]   Report Status PENDING   Incomplete  GRAM STAIN     Status: None   Collection Time    02/19/14 12:17 PM      Result Value Ref Range Status   Specimen Description ABSCESS RIGHT LIVER   Final   Special Requests Normal   Final   Gram Stain     Final   Value: ABUNDANT WBC PRESENT,BOTH PMN AND MONONUCLEAR     NO ORGANISMS SEEN   Report Status 02/19/2014 FINAL   Final  CULTURE, ROUTINE-ABSCESS     Status: None   Collection Time    02/19/14 12:17 PM      Result Value Ref Range Status   Specimen Description ABSCESS RIGHT LIVER   Final   Special Requests NONE   Final   Gram Stain     Final   Value: ABUNDANT WBC PRESENT,BOTH PMN AND MONONUCLEAR     NO SQUAMOUS EPITHELIAL CELLS SEEN     NO ORGANISMS SEEN     Performed at [facility]     Performed at [facility]    Culture     Final   Value: NO GROWTH 1 DAY     Performed at [facility]   Report Status PENDING   Incomplete  Studies: [place] Abscess Drain  02/20/2014   CLINICAL DATA:  73 year old male with complex fluid collection/ mass in the posterior right liver. Differential considerations include hepatic abscess and necrotic hepatic metastatic lesion. Ultrasound-guided biopsy/aspiration versus drain placement is warranted.  EXAM: ULTRASOUND GUIDED ABSCESS DRAINAGE  Date: 02/20/2014  PROCEDURE: 1. Ultrasound-guided aspiration of complex fluid collection in the right hepatic lobe 2. Ultrasound-guided placement of a 12 French percutaneous drainage catheter Interventional Radiologist:  Criselda Peaches, MD  ANESTHESIA/SEDATION: Moderate (conscious) sedation was used. 1 mg Versed, 25 mcg Fentanyl were administered intravenously. The patient's vital signs were monitored continuously by radiology nursing throughout the procedure.  Sedation Time: 25 minutes  The patient is currently on intravenous vancomycin and Zosyn. The the last Zosyn infusion was completed within the hr prior to the procedure.  TECHNIQUE: Informed consent was obtained from the patient following explanation of the procedure, risks, benefits and alternatives. The patient understands, agrees and consents for the procedure. All questions were addressed. A time out was performed.  The right upper quadrant was interrogated with ultrasound. There is a large complex fluid collection in the posterior aspect of the right hemi liver. No definite solid mural nodularity to suggest a necrotic mass. A suitable skin entry site was selected and marked. The region was then sterilely prepped and draped in the standard fashion using Betadine skin prep.  Local anesthesia was attained by infiltration with 1% lidocaine. Under real-time sonographic guidance, an 18 gauge trocar needle was advanced through a tract of normal hepatic parenchyma and into the complex  fluid collection. Aspiration was performed revealing thick purulent material. The appearance of the aspirated material is most suggestive of hepatic abscess. Therefore, a 0.035 Amplatz wire was advanced through the trocar needle and coiled within the complex fluid collection. The tract was then dilated to 86 [place] and a Cook 12 [place] multipurpose drainage catheter advanced over the wire and formed within the fluid collection. A total of 115 mL of thick, purulent material was successfully aspirated. A sample was sent for culture and a second sample sent for cytology.  The drainage catheter was gently flash thin connected to JP bulb suction. The catheter was then secured to the skin with 0 Prolene suture and an adhesive fixation device. Post aspiration ultrasound imaging demonstrates near-total resolution of the fluid collection.  The patient tolerated the procedure well.  COMPLICATIONS: None immediate  IMPRESSION: 1. Ultrasound evaluation demonstrates a complex cystic collection in the posterior right hemi liver. No mural nodularity or sonographic features to suggest the presence of metastatic tissue. 2. Initial ultrasound guided aspiration revealed thick, purulent material further strengthening the diagnosis of hepatic abscess. 3. Placement of a 75 French percutaneous drainage catheter with aspiration of 115 mL of thick, purulent material. Samples were sent both for cytology and culture.  PLAN: Maintain tube to JP bulb suction and flush 3 times daily with a small volume (5-10 mL) sterile saline. Follow cultures and titrate antibiotics accordingly. Prior to removal of the tube, recommend repeat CT scan of the abdomen with contrast material to assess for resolution of the hepatic abscess. Patient will be followed in the Interventional Radiology [facility].  Signed,  Criselda Peaches, MD  [facility]  [facility]   Electronically Signed   By: Jacqulynn Cadet M.D.    On: 02/20/2014 08:41    Scheduled Meds: . Influenza vac split quadrivalent PF  0.5 mL Intramuscular Tomorrow-1000  . piperacillin-tazobactam (ZOSYN)  IV  3.375 g Intravenous  3 times per day  . pneumococcal 23 valent vaccine  0.5 mL Intramuscular Tomorrow-1000  . terazosin  5 mg Oral Daily  . vancomycin  750 mg Intravenous Q12H   Continuous Infusions:   Antibiotics Given (last 72 hours)   Date/Time Action Medication Dose Rate   02/17/14 1700 Given   piperacillin-tazobactam (ZOSYN) IVPB 3.375 g 3.375 g 12.5 mL/hr   02/18/14 0044 Given   piperacillin-tazobactam (ZOSYN) IVPB 3.375 g 3.375 g 12.5 mL/hr   02/18/14 0800 Given   piperacillin-tazobactam (ZOSYN) IVPB 3.375 g 3.375 g 12.5 mL/hr   02/18/14 1730 Given   piperacillin-tazobactam (ZOSYN) IVPB 3.375 g 3.375 g 12.5 mL/hr   02/18/14 1752 Given   vancomycin (VANCOCIN) IVPB 750 mg/150 ml premix 750 mg 150 mL/hr   02/19/14 0110 Given   piperacillin-tazobactam (ZOSYN) IVPB 3.375 g 3.375 g 12.5 mL/hr   02/19/14 0656 Given   vancomycin (VANCOCIN) IVPB 750 mg/150 ml premix 750 mg 150 mL/hr   02/19/14 0826 Given   piperacillin-tazobactam (ZOSYN) IVPB 3.375 g 3.375 g 12.5 mL/hr   02/19/14 1818 Given   piperacillin-tazobactam (ZOSYN) IVPB 3.375 g 3.375 g 12.5 mL/hr   02/19/14 1822 Given   vancomycin (VANCOCIN) IVPB 750 mg/150 ml premix 750 mg 150 mL/hr   02/19/14 2326 Given   piperacillin-tazobactam (ZOSYN) IVPB 3.375 g 3.375 g 12.5 mL/hr   02/20/14 0518 Given   vancomycin (VANCOCIN) IVPB 750 mg/150 ml premix 750 mg 150 mL/hr   02/20/14 0908 Given   piperacillin-tazobactam (ZOSYN) IVPB 3.375 g 3.375 g 12.5 mL/hr      Principal Problem:   Essential hypertension Active Problems:   Liver abscess   BPH (benign prostatic hyperplasia)   Colonic mass    Time spent: 67min    Edword Cu  [facility] Pager [phone]. If 7PM-7AM, please contact night-coverage at [email or web address], password [facility] 02/20/2014, 12:56 PM  LOS: 5  days

## 2014-02-20 NOTE — Progress Notes (Signed)
IR findings noted. Gram stain shows multiple PMNs, consistent with purulent abscess drainage. Cytology is pending.  The patient remains without fever, and his white count continues to decline slowly. However, he is experiencing quite a bit of pain with movement and breathing, and is requiring pain medication.  Meanwhile, we have the issue of the possible mass in his ascending colon, as seen on CT scanning. The patient, and more especially his wife, are aware of this, and of the need for colonoscopic evaluation. We all agreed that he is not in shape at the present time to undergo the prep, much less the procedure itself, because of his discomfort and his inability to consume significant amounts of liquids at the present time.  Accordingly, I have recommended colonoscopy several weeks following discharge. They understand that evaluation of this lesion is not time critical, but by the same token, it should not be postponed indefinitely. I offered him the option of having it done hearing [place], but since they live up at [place], they would prefer to return to their normal caregivers to have this accomplished, so I explained I would simply remain on standby.  I will plan to sign off at this time. Please let me know if I can be of further assistance in this patient's care.  Brent Little, M.D. [phone]

## 2014-02-20 NOTE — Progress Notes (Signed)
Patient ID: Author Hatlestad, male   DOB: [birth year], 73 y.o.   MRN: [id number]   Referring Physician(s): [facility]   Subjective:  Liver lesion was determined to be abscess per aspiration 10/12 Drain placed Feels some better today groggy  Allergies: Review of patient's allergies indicates no known allergies.  Medications: Prior to Admission medications   Medication Sig Start Date End Date Taking? Authorizing Provider  hydrochlorothiazide (HYDRODIURIL) 25 MG tablet Take 25 mg by mouth daily.   Yes Historical Provider, MD  potassium chloride SA (K-DUR,KLOR-CON) 20 MEQ tablet Take 20 mEq by mouth daily.  02/09/14  Yes Historical Provider, MD  ramipril (ALTACE) 10 MG capsule Take 10 mg by mouth daily.  01/29/14  Yes Historical Provider, MD  terazosin (HYTRIN) 5 MG capsule Take 5 mg by mouth daily.  01/29/14  Yes Historical Provider, MD    Review of Systems  Vital Signs: BP 149/64  Pulse 87  Temp(Src) 98.4 F (36.9 C) (Oral)  Resp 20  Ht 6' (1.829 m)  Wt 78.2 kg (172 lb 6.4 oz)  BMI 23.38 kg/m2  SpO2 95%  Physical Exam  Abdominal:  Site of liver abscess drain clean and dry NT; no bleeding Output clear/dark yellow now---30 cc in JP 80 cc pus like yesterday Cx: abundant wbcs afeb    Imaging: [place] Abscess Drain  02/20/2014   CLINICAL DATA:  73 year old male with complex fluid collection/ mass in the posterior right liver. Differential considerations include hepatic abscess and necrotic hepatic metastatic lesion. Ultrasound-guided biopsy/aspiration versus drain placement is warranted.  EXAM: ULTRASOUND GUIDED ABSCESS DRAINAGE  Date: 02/20/2014  PROCEDURE: 1. Ultrasound-guided aspiration of complex fluid collection in the right hepatic lobe 2. Ultrasound-guided placement of a 12 French percutaneous drainage catheter Interventional Radiologist:  Criselda Peaches, MD  ANESTHESIA/SEDATION: Moderate (conscious) sedation was used. 1 mg Versed, 25 mcg Fentanyl were administered  intravenously. The patient's vital signs were monitored continuously by radiology nursing throughout the procedure.  Sedation Time: 25 minutes  The patient is currently on intravenous vancomycin and Zosyn. The the last Zosyn infusion was completed within the hr prior to the procedure.  TECHNIQUE: Informed consent was obtained from the patient following explanation of the procedure, risks, benefits and alternatives. The patient understands, agrees and consents for the procedure. All questions were addressed. A time out was performed.  The right upper quadrant was interrogated with ultrasound. There is a large complex fluid collection in the posterior aspect of the right hemi liver. No definite solid mural nodularity to suggest a necrotic mass. A suitable skin entry site was selected and marked. The region was then sterilely prepped and draped in the standard fashion using Betadine skin prep.  Local anesthesia was attained by infiltration with 1% lidocaine. Under real-time sonographic guidance, an 18 gauge trocar needle was advanced through a tract of normal hepatic parenchyma and into the complex fluid collection. Aspiration was performed revealing thick purulent material. The appearance of the aspirated material is most suggestive of hepatic abscess. Therefore, a 0.035 Amplatz wire was advanced through the trocar needle and coiled within the complex fluid collection. The tract was then dilated to 62 [place] and a Cook 12 [place] multipurpose drainage catheter advanced over the wire and formed within the fluid collection. A total of 115 mL of thick, purulent material was successfully aspirated. A sample was sent for culture and a second sample sent for cytology.  The drainage catheter was gently flash thin connected to JP bulb suction. The catheter was then  secured to the skin with 0 Prolene suture and an adhesive fixation device. Post aspiration ultrasound imaging demonstrates near-total resolution of the fluid  collection.  The patient tolerated the procedure well.  COMPLICATIONS: None immediate  IMPRESSION: 1. Ultrasound evaluation demonstrates a complex cystic collection in the posterior right hemi liver. No mural nodularity or sonographic features to suggest the presence of metastatic tissue. 2. Initial ultrasound guided aspiration revealed thick, purulent material further strengthening the diagnosis of hepatic abscess. 3. Placement of a 22 French percutaneous drainage catheter with aspiration of 115 mL of thick, purulent material. Samples were sent both for cytology and culture.  PLAN: Maintain tube to JP bulb suction and flush 3 times daily with a small volume (5-10 mL) sterile saline. Follow cultures and titrate antibiotics accordingly. Prior to removal of the tube, recommend repeat CT scan of the abdomen with contrast material to assess for resolution of the hepatic abscess. Patient will be followed in the Interventional Radiology [facility].  Signed,  Criselda Peaches, MD  [facility]  [facility]   Electronically Signed   By: Jacqulynn Cadet M.D.   On: 02/20/2014 08:41    Labs:  CBC:  Recent Labs  02/15/14 1243 02/16/14 0415 02/17/14 0026 02/19/14 0550  WBC 17.8* 16.6* 16.5* 14.0*  HGB 8.8* 9.0* 9.4* 8.6*  HCT 26.1* 26.8* 27.9* 25.6*  PLT 302 303 348 395    COAGS:  Recent Labs  02/15/14 1243 02/15/14 1539 02/19/14 0550  INR 1.24 1.18 1.41  APTT 35  --  46*    BMP:  Recent Labs  02/15/14 1243 02/16/14 0415 02/17/14 0026 02/19/14 0550  NA 135* 134* 135* 138  K 3.3* 3.6* 3.7 3.5*  CL 95* 95* 94* 99  CO2 26 26 27 26   GLUCOSE 104* 116* 95 105*  BUN 18 19 17 15   CALCIUM 8.7 8.6 8.8 8.6  CREATININE 1.09 1.21 1.31 1.32  GFRNONAA 65* 57* 52* 51*  GFRAA 75* 66* 60* 60*    LIVER FUNCTION TESTS:  Recent Labs  02/15/14 1243 02/17/14 0026 02/19/14 0550  BILITOT 0.7 1.8* 1.3*  AST 42* 31 30  ALT 31 23 19   ALKPHOS  209* 197* 164*  PROT 6.3 6.7 6.0  ALBUMIN 2.1* 2.3* 2.0*    Assessment and Plan:  Liver abscess asp/drain placement 10/12 Will follow    I spent a total of 15 minutes face to face in clinical consultation/evaluation, greater than 50% of which was counseling/coordinating care for liver abscess drain  Signed: Caterin Tabares A 02/20/2014, 10:13 AM

## 2014-02-21 DIAGNOSIS — K750 Abscess of liver: Principal | ICD-10-CM

## 2014-02-21 LAB — CULTURE, BLOOD (ROUTINE X 2)
CULTURE: NO GROWTH
Culture: NO GROWTH

## 2014-02-21 LAB — COMPREHENSIVE METABOLIC PANEL
ALBUMIN: 2 g/dL — AB (ref 3.5–5.2)
ALT: 33 U/L (ref 0–53)
AST: 53 U/L — ABNORMAL HIGH (ref 0–37)
Alkaline Phosphatase: 209 U/L — ABNORMAL HIGH (ref 39–117)
Anion gap: 14 (ref 5–15)
BILIRUBIN TOTAL: 1.1 mg/dL (ref 0.3–1.2)
BUN: 16 mg/dL (ref 6–23)
CALCIUM: 8.9 mg/dL (ref 8.4–10.5)
CHLORIDE: 100 meq/L (ref 96–112)
CO2: 23 mEq/L (ref 19–32)
Creatinine, Ser: 1.26 mg/dL (ref 0.50–1.35)
GFR calc Af Amer: 63 mL/min — ABNORMAL LOW (ref >90)
GFR calc non Af Amer: 54 mL/min — ABNORMAL LOW (ref >90)
Glucose, Bld: 114 mg/dL — ABNORMAL HIGH (ref 70–99)
Potassium: 3.6 mEq/L — ABNORMAL LOW (ref 3.7–5.3)
Sodium: 137 mEq/L (ref 137–147)
Total Protein: 6.4 g/dL (ref 6.0–8.3)

## 2014-02-21 LAB — CBC
HCT: 26.6 % — ABNORMAL LOW (ref 39.0–52.0)
Hemoglobin: 8.9 g/dL — ABNORMAL LOW (ref 13.0–17.0)
MCH: 26.2 pg (ref 26.0–34.0)
MCHC: 33.5 g/dL (ref 30.0–36.0)
MCV: 78.2 fL (ref 78.0–100.0)
PLATELETS: 383 10*3/uL (ref 150–400)
RBC: 3.4 MIL/uL — AB (ref 4.22–5.81)
RDW: 15.2 % (ref 11.5–15.5)
WBC: 10.5 10*3/uL (ref 4.0–10.5)

## 2014-02-21 LAB — URINE CULTURE
COLONY COUNT: NO GROWTH
CULTURE: NO GROWTH

## 2014-02-21 MED ORDER — DIPHENHYDRAMINE HCL 25 MG PO CAPS
50.0000 mg | ORAL_CAPSULE | Freq: Every evening | ORAL | Status: DC | PRN
  Administered 2014-02-21 – 2014-03-02 (×3): 50 mg via ORAL
  Filled 2014-02-21 (×3): qty 2

## 2014-02-21 MED ORDER — POTASSIUM CHLORIDE CRYS ER 20 MEQ PO TBCR
40.0000 meq | EXTENDED_RELEASE_TABLET | Freq: Once | ORAL | Status: AC
  Administered 2014-02-21: 40 meq via ORAL
  Filled 2014-02-21: qty 2

## 2014-02-21 MED ORDER — KETOROLAC TROMETHAMINE 10 MG PO TABS
10.0000 mg | ORAL_TABLET | Freq: Four times a day (QID) | ORAL | Status: DC | PRN
  Administered 2014-02-21: 10 mg via ORAL
  Filled 2014-02-21 (×2): qty 1

## 2014-02-21 NOTE — Progress Notes (Signed)
[facility] PROGRESS NOTE  Clair Alfieri QPY:[id number] DOB: Mar 21, 1941 DOA: 02/15/2014 PCP: [name], MD  Brief Narrative: Brent Little is a 73 y.o. male, With H/O HTN, Kidney stone, who initially presented to [place] about one week ago with fever, dull right upper quadrant pain and a dry cough. He was diagnosed there with a possible community-acquired pneumonia, was placed on Levaquin, he had marginal improvement. During his fever workup he had a right upper quadrant ultrasound which showed some nonspecific changes . He was then discharged home with outpatient MRI, MRI done in the outpatient setting showed a septated cystic lesion in the right hepatic lobe concerning for abscess versus metastatic necrotic mass. MRI also showed an ascending colon circumferential mass. He presented to [facility] with fever, chills, decreased appetite and lethargy. Now being followed by CCS/GI and IR. Has been on broad spectrum Abx, finally underwent Liver biopsy 10/12,. And a drain put it.    Assessment/Plan: 1. Liver abscess vs necrotic mets  -continue current Abx-day 6 of Vanc/Zosyn -s/p US guided biopsy 10/12; yielded purulent appearing material12F drain was placed under US guidance.-Culture-gram stain-no organisms seen, FU cultures -afebrile today -Blood CX-NGTD  2. Ascending colon mass -never had colonoscopy, needs this, patient and family wants to be done at [place] . -CEA mildly elevated  3. HTN -stable, ACE on hold  4. Anemia -anemia panel c/w Iron deficiency  5. ? Dysuria yesterday -UA benign  6. Confusion/encephalopathy -Improving, possibly from the sedating pain meds.   DVT proph: lovenox  Code Status: Full Code Family Communication: wife at bedside Disposition Plan: pending workup   Consultants:  GI  CCS  IR  HPI/Subjective: Comfortable, cheerful. Pain is better.   Objective: Filed Vitals:   02/21/14 0352  BP: 121/51  Pulse: 92  Temp: 98 F (36.7 C)   Resp: 18    Intake/Output Summary (Last 24 hours) at 02/21/14 1249 Last data filed at 02/21/14 0755  Gross per 24 hour  Intake    125 ml  Output    345 ml  Net   -220 ml   Filed Weights   02/19/14 0423 02/20/14 0430 02/21/14 0352  Weight: 77.4 kg (170 lb 10.2 oz) 78.2 kg (172 lb 6.4 oz) 73.074 kg (161 lb 1.6 oz)    Exam:   General:  Confused, but alert and conversant.   Cardiovascular: S1S2/RRR  Respiratory: CTAB  Abdomen: soft, mild tenderness in RUQ, BS present  Musculoskeletal: no edema c/c   Data Reviewed: Basic Metabolic Panel:  Recent Labs Lab 02/15/14 1243 02/16/14 0415 02/17/14 0026 02/19/14 0550 02/21/14 1100  NA 135* 134* 135* 138 137  K 3.3* 3.6* 3.7 3.5* 3.6*  CL 95* 95* 94* 99 100  CO2 26 26 27 26 23   GLUCOSE 104* 116* 95 105* 114*  BUN 18 19 17 15 16   CREATININE 1.09 1.21 1.31 1.32 1.26  CALCIUM 8.7 8.6 8.8 8.6 8.9   Liver Function Tests:  Recent Labs Lab 02/15/14 1243 02/17/14 0026 02/19/14 0550 02/21/14 1100  AST 42* 31 30 53*  ALT 31 23 19  33  ALKPHOS 209* 197* 164* 209*  BILITOT 0.7 1.8* 1.3* 1.1  PROT 6.3 6.7 6.0 6.4  ALBUMIN 2.1* 2.3* 2.0* 2.0*   No results found for this basename: LIPASE, AMYLASE,  in the last 168 hours No results found for this basename: AMMONIA,  in the last 168 hours CBC:  Recent Labs Lab 02/15/14 1243 02/16/14 0415 02/17/14 0026 02/19/14 0550 02/21/14 1100  WBC  17.8* 16.6* 16.5* 14.0* 10.5  NEUTROABS 15.1*  --   --   --   --   HGB 8.8* 9.0* 9.4* 8.6* 8.9*  HCT 26.1* 26.8* 27.9* 25.6* 26.6*  MCV 77.4* 77.7* 77.9* 77.8* 78.2  PLT 302 303 348 395 383   Cardiac Enzymes: No results found for this basename: CKTOTAL, CKMB, CKMBINDEX, TROPONINI,  in the last 168 hours BNP (last 3 results) No results found for this basename: PROBNP,  in the last 8760 hours CBG: No results found for this basename: GLUCAP,  in the last 168 hours  Recent Results (from the past 240 hour(s))  CULTURE, BLOOD (ROUTINE  X 2)     Status: None   Collection Time    02/15/14  3:15 PM      Result Value Ref Range Status   Specimen Description BLOOD HAND RIGHT   Final   Special Requests BOTTLES DRAWN AEROBIC AND ANAEROBIC 5CC   Final   Culture  Setup Time     Final   Value: 02/15/2014 20:13     Performed at [place]     Final   Value: NO GROWTH 5 DAYS     Performed at [facility]   Report Status 02/21/2014 FINAL   Final  CULTURE, BLOOD (ROUTINE X 2)     Status: None   Collection Time    02/15/14  3:30 PM      Result Value Ref Range Status   Specimen Description BLOOD ARM LEFT   Final   Special Requests BOTTLES DRAWN AEROBIC ONLY 5CC   Final   Culture  Setup Time     Final   Value: 02/15/2014 20:12     Performed at [place]     Final   Value: NO GROWTH 5 DAYS     Performed at [facility]   Report Status 02/21/2014 FINAL   Final  GRAM STAIN     Status: None   Collection Time    02/19/14 12:17 PM      Result Value Ref Range Status   Specimen Description ABSCESS RIGHT LIVER   Final   Special Requests Normal   Final   Gram Stain     Final   Value: ABUNDANT WBC PRESENT,BOTH PMN AND MONONUCLEAR     NO ORGANISMS SEEN   Report Status 02/19/2014 FINAL   Final  CULTURE, ROUTINE-ABSCESS     Status: None   Collection Time    02/19/14 12:17 PM      Result Value Ref Range Status   Specimen Description ABSCESS RIGHT LIVER   Final   Special Requests NONE   Final   Gram Stain     Final   Value: ABUNDANT WBC PRESENT,BOTH PMN AND MONONUCLEAR     NO SQUAMOUS EPITHELIAL CELLS SEEN     NO ORGANISMS SEEN     Performed at [facility]     Performed at [facility]   Culture     Final   Value: NO GROWTH 2 DAYS     Performed at [facility]   Report Status PENDING   Incomplete  URINE CULTURE     Status: None   Collection Time    02/20/14 12:45 AM      Result Value Ref Range Status   Specimen Description URINE, CLEAN CATCH    Final   Special Requests NONE   Final   Culture  Setup  Time     Final   Value: 02/20/2014 05:06     Performed at [place]     Final   Value: NO GROWTH     Performed at [facility]   Culture     Final   Value: NO GROWTH     Performed at [facility]   Report Status 02/21/2014 FINAL   Final     Studies: [place] Abscess Drain  02/20/2014   CLINICAL DATA:  73 year old male with complex fluid collection/ mass in the posterior right liver. Differential considerations include hepatic abscess and necrotic hepatic metastatic lesion. Ultrasound-guided biopsy/aspiration versus drain placement is warranted.  EXAM: ULTRASOUND GUIDED ABSCESS DRAINAGE  Date: 02/20/2014  PROCEDURE: 1. Ultrasound-guided aspiration of complex fluid collection in the right hepatic lobe 2. Ultrasound-guided placement of a 12 French percutaneous drainage catheter Interventional Radiologist:  Criselda Peaches, MD  ANESTHESIA/SEDATION: Moderate (conscious) sedation was used. 1 mg Versed, 25 mcg Fentanyl were administered intravenously. The patient's vital signs were monitored continuously by radiology nursing throughout the procedure.  Sedation Time: 25 minutes  The patient is currently on intravenous vancomycin and Zosyn. The the last Zosyn infusion was completed within the hr prior to the procedure.  TECHNIQUE: Informed consent was obtained from the patient following explanation of the procedure, risks, benefits and alternatives. The patient understands, agrees and consents for the procedure. All questions were addressed. A time out was performed.  The right upper quadrant was interrogated with ultrasound. There is a large complex fluid collection in the posterior aspect of the right hemi liver. No definite solid mural nodularity to suggest a necrotic mass. A suitable skin entry site was selected and marked. The region was then sterilely prepped and draped in the standard fashion using Betadine  skin prep.  Local anesthesia was attained by infiltration with 1% lidocaine. Under real-time sonographic guidance, an 18 gauge trocar needle was advanced through a tract of normal hepatic parenchyma and into the complex fluid collection. Aspiration was performed revealing thick purulent material. The appearance of the aspirated material is most suggestive of hepatic abscess. Therefore, a 0.035 Amplatz wire was advanced through the trocar needle and coiled within the complex fluid collection. The tract was then dilated to 70 [place] and a Cook 12 [place] multipurpose drainage catheter advanced over the wire and formed within the fluid collection. A total of 115 mL of thick, purulent material was successfully aspirated. A sample was sent for culture and a second sample sent for cytology.  The drainage catheter was gently flash thin connected to JP bulb suction. The catheter was then secured to the skin with 0 Prolene suture and an adhesive fixation device. Post aspiration ultrasound imaging demonstrates near-total resolution of the fluid collection.  The patient tolerated the procedure well.  COMPLICATIONS: None immediate  IMPRESSION: 1. Ultrasound evaluation demonstrates a complex cystic collection in the posterior right hemi liver. No mural nodularity or sonographic features to suggest the presence of metastatic tissue. 2. Initial ultrasound guided aspiration revealed thick, purulent material further strengthening the diagnosis of hepatic abscess. 3. Placement of a 67 French percutaneous drainage catheter with aspiration of 115 mL of thick, purulent material. Samples were sent both for cytology and culture.  PLAN: Maintain tube to JP bulb suction and flush 3 times daily with a small volume (5-10 mL) sterile saline. Follow cultures and titrate antibiotics accordingly. Prior to removal of the tube, recommend repeat CT scan of the abdomen with contrast  material to assess for resolution of the hepatic abscess. Patient  will be followed in the Interventional Radiology [facility].  Signed,  Criselda Peaches, MD  [facility]  [facility]   Electronically Signed   By: Jacqulynn Cadet M.D.   On: 02/20/2014 08:41    Scheduled Meds: . Influenza vac split quadrivalent PF  0.5 mL Intramuscular Tomorrow-1000  . piperacillin-tazobactam (ZOSYN)  IV  3.375 g Intravenous 3 times per day  . pneumococcal 23 valent vaccine  0.5 mL Intramuscular Tomorrow-1000  . potassium chloride  40 mEq Oral Once  . terazosin  5 mg Oral Daily  . vancomycin  750 mg Intravenous Q12H   Continuous Infusions:   Antibiotics Given (last 72 hours)   Date/Time Action Medication Dose Rate   02/18/14 1730 Given   piperacillin-tazobactam (ZOSYN) IVPB 3.375 g 3.375 g 12.5 mL/hr   02/18/14 1752 Given   vancomycin (VANCOCIN) IVPB 750 mg/150 ml premix 750 mg 150 mL/hr   02/19/14 0110 Given   piperacillin-tazobactam (ZOSYN) IVPB 3.375 g 3.375 g 12.5 mL/hr   02/19/14 0656 Given   vancomycin (VANCOCIN) IVPB 750 mg/150 ml premix 750 mg 150 mL/hr   02/19/14 0826 Given   piperacillin-tazobactam (ZOSYN) IVPB 3.375 g 3.375 g 12.5 mL/hr   02/19/14 1818 Given   piperacillin-tazobactam (ZOSYN) IVPB 3.375 g 3.375 g 12.5 mL/hr   02/19/14 1822 Given   vancomycin (VANCOCIN) IVPB 750 mg/150 ml premix 750 mg 150 mL/hr   02/19/14 2326 Given   piperacillin-tazobactam (ZOSYN) IVPB 3.375 g 3.375 g 12.5 mL/hr   02/20/14 0518 Given   vancomycin (VANCOCIN) IVPB 750 mg/150 ml premix 750 mg 150 mL/hr   02/20/14 0908 Given   piperacillin-tazobactam (ZOSYN) IVPB 3.375 g 3.375 g 12.5 mL/hr   02/20/14 1552 Given   piperacillin-tazobactam (ZOSYN) IVPB 3.375 g 3.375 g 12.5 mL/hr   02/20/14 1755 Given   vancomycin (VANCOCIN) IVPB 750 mg/150 ml premix 750 mg 150 mL/hr   02/20/14 2341 Given   piperacillin-tazobactam (ZOSYN) IVPB 3.375 g 3.375 g 12.5 mL/hr   02/21/14 0504 Given   vancomycin (VANCOCIN) IVPB 750 mg/150 ml  premix 750 mg 150 mL/hr   02/21/14 0815 Given   piperacillin-tazobactam (ZOSYN) IVPB 3.375 g 3.375 g 12.5 mL/hr      Principal Problem:   Essential hypertension Active Problems:   Liver abscess   BPH (benign prostatic hyperplasia)   Colonic mass    Time spent: 38min    [facility]  [facility] Pager [phone]. If 7PM-7AM, please contact night-coverage at [email or web address], password [facility] 02/21/2014, 12:49 PM  LOS: 6 days

## 2014-02-21 NOTE — Progress Notes (Signed)
Patient ID: Brent Little, male   DOB: December 05, 1940, 73 y.o.   MRN: [id number] Patient's CT procedure revealed a liver abscess and not metastatic disease.  He will need a colonoscopy for the possible right colon mass.  It appears GI was going to hold off and perform this as an outpatient.  The patient would prefer to have this done in [place].  There is no acute surgical role in this patient.  Defer further treatment to the primary team and work up to be done as an outpatient in [place].  We will sign off.  Kiylee Thoreson E 7:58 AM 02/21/2014

## 2014-02-21 NOTE — Progress Notes (Signed)
Gen. Surgery attending:  Agree with assessment and plan as outlined above. There does not appear to be any role for surgical intervention at this time. And future workup will be directed by gastroenterology, possibly in [place]. We are signing off.  Edsel Petrin. Dalbert Batman, M.D., [facility] General and Minimally invasive Surgery Breast and Colorectal Surgery Office:   [phone]

## 2014-02-21 NOTE — Progress Notes (Signed)
ANTIBIOTIC CONSULT NOTE - FOLLOW UP  Pharmacy Consult for Vancomycin and Zosyn Indication: pneumonia and liver abscess coverage  No Known Allergies  Patient Measurements: Height: 6' (182.9 cm) Weight: 161 lb 1.6 oz (73.074 kg) IBW/kg (Calculated) : 77.6  Vital Signs: Temp: 98 F (36.7 C) (10/14 0352) Temp Source: Oral (10/14 0352) BP: 121/51 mmHg (10/14 0352) Pulse Rate: 92 (10/14 0352) Intake/Output from previous day: 10/13 0701 - 10/14 0700 In: 10  Out: 245 [Urine:200; Drains:45] Intake/Output from this shift: Total I/O In: 120 [P.O.:120] Out: 100 [Urine:100]  Labs:  Recent Labs  02/19/14 0550 02/21/14 1100  WBC 14.0* 10.5  HGB 8.6* 8.9*  PLT 395 383  CREATININE 1.32 1.26   Estimated Creatinine Clearance: 53.2 ml/min (by C-G formula based on Cr of 1.26).  Recent Labs  02/18/14 1600  VANCOTROUGH 22.3*    Assessment:   Day # 7 Vancomycin and Zosyn.  Hepatic abscess aspirated and drain placed on 10/12. Cultures negative to date. Afebrile, WBC down to 10.5. Renal function about the same.   Vancomycin regimen adjusted from 1 gram to 750 mg IV q12hrs on 02/18/14 when trough level was just above target range.  Goal of Therapy:  Vancomycin trough level 15-20 mcg/ml appropriate Zosyn dose for renal function and infection  Plan:    Continue Vancomycin 750 mg IV q12hrs.   Continue Zosyn 3.375 grams IV q8hrs (each over 4 hours).   Follow renal function, culture data and clinical progress.   Will consider rechecking Vancomycin trough level by 02/25/14.  Arty Baumgartner, [place] Pager: [phone] 02/21/2014,12:59 PM

## 2014-02-22 LAB — CULTURE, ROUTINE-ABSCESS: Culture: NO GROWTH

## 2014-02-22 MED ORDER — HALOPERIDOL LACTATE 5 MG/ML IJ SOLN
2.0000 mg | Freq: Four times a day (QID) | INTRAMUSCULAR | Status: DC | PRN
  Filled 2014-02-22: qty 0.4

## 2014-02-22 NOTE — Progress Notes (Signed)
Patient ID: Brent Little, male   DOB: [birth year], 73 y.o.   MRN: [id number]   Referring Physician(s): TRH  Subjective:  Liver abscess drain placed 10/12 Still draining well No complaints Pt with confusion-- wife at bedside  Allergies: Review of patient's allergies indicates no known allergies.  Medications: Prior to Admission medications   Medication Sig Start Date End Date Taking? Authorizing Provider  hydrochlorothiazide (HYDRODIURIL) 25 MG tablet Take 25 mg by mouth daily.   Yes Historical Provider, MD  potassium chloride SA (K-DUR,KLOR-CON) 20 MEQ tablet Take 20 mEq by mouth daily.  02/09/14  Yes Historical Provider, MD  ramipril (ALTACE) 10 MG capsule Take 10 mg by mouth daily.  01/29/14  Yes Historical Provider, MD  terazosin (HYTRIN) 5 MG capsule Take 5 mg by mouth daily.  01/29/14  Yes Historical Provider, MD    Review of Systems  Vital Signs: BP 119/52  Pulse 59  Temp(Src) 97.8 F (36.6 C) (Oral)  Resp 18  Ht 6' (1.829 m)  Wt 75.8 kg (167 lb 1.7 oz)  BMI 22.66 kg/m2  SpO2 96%  Physical Exam  Abdominal:  abd soft NT Site of drain is NT; no bleeding Clean and dry Output significant at 45 cc yesterday [employer] color Abundant wbcs- no orgs    Imaging: [place] Abscess Drain  02/20/2014   CLINICAL DATA:  73 year old male with complex fluid collection/ mass in the posterior right liver. Differential considerations include hepatic abscess and necrotic hepatic metastatic lesion. Ultrasound-guided biopsy/aspiration versus drain placement is warranted.  EXAM: ULTRASOUND GUIDED ABSCESS DRAINAGE  Date: 02/20/2014  PROCEDURE: 1. Ultrasound-guided aspiration of complex fluid collection in the right hepatic lobe 2. Ultrasound-guided placement of a 12 French percutaneous drainage catheter Interventional Radiologist:  Criselda Peaches, MD  ANESTHESIA/SEDATION: Moderate (conscious) sedation was used. 1 mg Versed, 25 mcg Fentanyl were administered intravenously. The patient's vital signs  were monitored continuously by radiology nursing throughout the procedure.  Sedation Time: 25 minutes  The patient is currently on intravenous vancomycin and Zosyn. The the last Zosyn infusion was completed within the hr prior to the procedure.  TECHNIQUE: Informed consent was obtained from the patient following explanation of the procedure, risks, benefits and alternatives. The patient understands, agrees and consents for the procedure. All questions were addressed. A time out was performed.  The right upper quadrant was interrogated with ultrasound. There is a large complex fluid collection in the posterior aspect of the right hemi liver. No definite solid mural nodularity to suggest a necrotic mass. A suitable skin entry site was selected and marked. The region was then sterilely prepped and draped in the standard fashion using Betadine skin prep.  Local anesthesia was attained by infiltration with 1% lidocaine. Under real-time sonographic guidance, an 18 gauge trocar needle was advanced through a tract of normal hepatic parenchyma and into the complex fluid collection. Aspiration was performed revealing thick purulent material. The appearance of the aspirated material is most suggestive of hepatic abscess. Therefore, a 0.035 Amplatz wire was advanced through the trocar needle and coiled within the complex fluid collection. The tract was then dilated to 29 [place] and a Cook 12 [place] multipurpose drainage catheter advanced over the wire and formed within the fluid collection. A total of 115 mL of thick, purulent material was successfully aspirated. A sample was sent for culture and a second sample sent for cytology.  The drainage catheter was gently flash thin connected to JP bulb suction. The catheter was then secured to the skin with  0 Prolene suture and an adhesive fixation device. Post aspiration ultrasound imaging demonstrates near-total resolution of the fluid collection.  The patient tolerated the  procedure well.  COMPLICATIONS: None immediate  IMPRESSION: 1. Ultrasound evaluation demonstrates a complex cystic collection in the posterior right hemi liver. No mural nodularity or sonographic features to suggest the presence of metastatic tissue. 2. Initial ultrasound guided aspiration revealed thick, purulent material further strengthening the diagnosis of hepatic abscess. 3. Placement of a 50 French percutaneous drainage catheter with aspiration of 115 mL of thick, purulent material. Samples were sent both for cytology and culture.  PLAN: Maintain tube to JP bulb suction and flush 3 times daily with a small volume (5-10 mL) sterile saline. Follow cultures and titrate antibiotics accordingly. Prior to removal of the tube, recommend repeat CT scan of the abdomen with contrast material to assess for resolution of the hepatic abscess. Patient will be followed in the Interventional Radiology [facility].  Signed,  Criselda Peaches, MD  [facility]  [facility]   Electronically Signed   By: Jacqulynn Cadet M.D.   On: 02/20/2014 08:41    Labs:  CBC:  Recent Labs  02/16/14 0415 02/17/14 0026 02/19/14 0550 02/21/14 1100  WBC 16.6* 16.5* 14.0* 10.5  HGB 9.0* 9.4* 8.6* 8.9*  HCT 26.8* 27.9* 25.6* 26.6*  PLT 303 348 395 383    COAGS:  Recent Labs  02/15/14 1243 02/15/14 1539 02/19/14 0550  INR 1.24 1.18 1.41  APTT 35  --  46*    BMP:  Recent Labs  02/16/14 0415 02/17/14 0026 02/19/14 0550 02/21/14 1100  NA 134* 135* 138 137  K 3.6* 3.7 3.5* 3.6*  CL 95* 94* 99 100  CO2 26 27 26 23   GLUCOSE 116* 95 105* 114*  BUN 19 17 15 16   CALCIUM 8.6 8.8 8.6 8.9  CREATININE 1.21 1.31 1.32 1.26  GFRNONAA 57* 52* 51* 54*  GFRAA 66* 60* 60* 63*    LIVER FUNCTION TESTS:  Recent Labs  02/15/14 1243 02/17/14 0026 02/19/14 0550 02/21/14 1100  BILITOT 0.7 1.8* 1.3* 1.1  AST 42* 31 30 53*  ALT 31 23 19  33  ALKPHOS 209* 197* 164*  209*  PROT 6.3 6.7 6.0 6.4  ALBUMIN 2.1* 2.3* 2.0* 2.0*    Assessment and Plan:  Liver abscess drain intact Will follow Will need CT when output less 10 cc/day   I spent a total of 15 minutes face to face in clinical consultation/evaluation, greater than 50% of which was counseling/coordinating care for liver abscess drain  Signed: Wilmot Quevedo A 02/22/2014, 8:08 AM

## 2014-02-22 NOTE — Evaluation (Signed)
Physical Therapy Evaluation Patient Details Name: Brent Little MRN: [id number] DOB: [birth year] Today's Date: 02/22/2014   History of Present Illness  Pt admitted with fever chills, decreased appetite and lethargy.  Liver mass noted on CT and diagnosed as an abcess after biopsy.  Clinical Impression  Pt admitted with above. Pt currently with functional limitations due to the deficits listed below (see PT Problem List). Min assist needed for all functional mobility.  Gait distance limited to 5' with RW bedside due to lethargy and confusion.  Pt's wife reports he had diarrhea overnight and presents with increased confusion today. Pt will benefit from skilled PT to increase their independence and safety with mobility to allow discharge to the venue listed below. Recommend SNF at d/c.  If pt's confusion clears, he demonstrates good strength and may progress to d/c home with HHPT.      Follow Up Recommendations SNF;Supervision/Assistance - 24 hour    Equipment Recommendations  None recommended by PT    Recommendations for Other Services       Precautions / Restrictions Precautions Precautions: Fall Restrictions Weight Bearing Restrictions: No      Mobility  Bed Mobility Overal bed mobility: Needs Assistance Bed Mobility: Supine to Sit;Sit to Supine     Supine to sit: Min assist;HOB elevated Sit to supine: Min assist   General bed mobility comments: verbal/tactile cues for sequencing  Transfers Overall transfer level: Needs assistance Equipment used: Rolling walker (2 wheeled) Transfers: Sit to/from Stand Sit to Stand: Min assist         General transfer comment: retropulsive with initial stances, verbal cues for hand placement and safety  Ambulation/Gait Ambulation/Gait assistance: Min assist Ambulation Distance (Feet): 5 Feet Assistive device: Rolling walker (2 wheeled) Gait Pattern/deviations: Decreased stride length;Leaning posteriorly;Narrow base of support Gait  velocity: decreased   General Gait Details: unsteady with decreased safety awareness and poor management of RW  Stairs            Wheelchair Mobility    Modified Rankin (Stroke Patients Only)       Balance Overall balance assessment: Needs assistance Sitting-balance support: No upper extremity supported;Feet supported Sitting balance-Leahy Scale: Good     Standing balance support: Bilateral upper extremity supported Standing balance-Leahy Scale: Poor                               Pertinent Vitals/Pain Pain Assessment: Faces Pain Score: 0-No pain    Home Living Family/patient expects to be discharged to:: Private residence Living Arrangements: Spouse/significant other Available Help at Discharge: Family;Available 24 hours/day Type of Home: House Home Access: Stairs to enter     Home Layout: One level Home Equipment: Environmental consultant - 2 wheels      Prior Function Level of Independence: Independent               Hand Dominance        Extremity/Trunk Assessment   Upper Extremity Assessment: Overall WFL for tasks assessed           Lower Extremity Assessment: Overall WFL for tasks assessed      Cervical / Trunk Assessment: Normal  Communication   Communication: No difficulties  Cognition Arousal/Alertness: Lethargic;Suspect due to medications Behavior During Therapy: Impulsive Overall Cognitive Status: Impaired/Different from baseline Area of Impairment: Orientation;Attention;Memory;Following commands;Safety/judgement;Problem solving Orientation Level: Disoriented to;Place;Time;Situation Current Attention Level: Sustained Memory: Decreased short-term memory;Decreased recall of precautions Following Commands: Follows one step commands consistently Safety/Judgement:  Decreased awareness of safety   Problem Solving: Requires verbal cues;Difficulty sequencing;Slow processing General Comments: Wife reports pt with increased confusion  today.    General Comments      Exercises        Assessment/Plan    PT Assessment Patient needs continued PT services  PT Diagnosis Difficulty walking;Altered mental status   PT Problem List Decreased activity tolerance;Decreased balance;Decreased mobility;Decreased coordination;Decreased knowledge of precautions;Decreased safety awareness;Decreased knowledge of use of DME  PT Treatment Interventions DME instruction;Gait training;Stair training;Functional mobility training;Therapeutic activities;Therapeutic exercise;Patient/family education;Balance training   PT Goals (Current goals can be found in the Care Plan section) Acute Rehab PT Goals Patient Stated Goal: unable to state PT Goal Formulation: With patient/family Time For Goal Achievement: 03/08/14 Potential to Achieve Goals: Good    Frequency Min 3X/week   Barriers to discharge        Co-evaluation               End of Session Equipment Utilized During Treatment: Gait belt Activity Tolerance: Patient limited by lethargy Patient left: in bed;with call bell/phone within reach;with bed alarm set;with family/visitor present           Time: 4917-9150 PT Time Calculation (min): 24 min   Charges:   PT Evaluation $Initial PT Evaluation Tier I: 1 Procedure PT Treatments $Therapeutic Activity: 8-22 mins   PT G Codes:          Lorriane Shire 02/22/2014, 11:10 AM

## 2014-02-22 NOTE — Evaluation (Signed)
Occupational Therapy Evaluation Patient Details Name: Brent Little MRN: [id number] DOB: [birth year] Today's Date: 02/22/2014    History of Present Illness Pt admitted with fever chills, decreased appetite and lethargy.  Liver mass noted on CT and diagnosed as an abcess after biopsy.   Clinical Impression   Prior to admission, pt was independent, driving and playing golf regularly.  Pt now presents with fair strength overall, but impaired cognition and lethargy.  He is dependent in all self care and requires assistance for mobility.  Will follow acutely. Pt will likely need post acute rehab prior to return home.    Follow Up Recommendations  SNF;Supervision/Assistance - 24 hour    Equipment Recommendations       Recommendations for Other Services       Precautions / Restrictions Precautions Precautions: Fall Restrictions Weight Bearing Restrictions: No      Mobility Bed Mobility Overal bed mobility: Needs Assistance Bed Mobility: Supine to Sit;Sit to Supine     Supine to sit: Min assist;HOB elevated Sit to supine: Min assist   General bed mobility comments: verbal/tactile cues for sequencing  Transfers Overall transfer level: Needs assistance Equipment used: Rolling walker (2 wheeled) Transfers: Sit to/from Stand Sit to Stand: Min assist         General transfer comment: assist to shift weight forward intially, verbal cues and physical guidance for hand placement on walker.    Balance Overall balance assessment: Needs assistance Sitting-balance support: No upper extremity supported;Feet supported Sitting balance-Leahy Scale: Good     Standing balance support: Bilateral upper extremity supported Standing balance-Leahy Scale: Poor                              ADL Overall ADL's : Needs assistance/impaired Eating/Feeding: Maximal assistance;Sitting (can bring fork to mouth and cup, but cognition interferes)   Grooming: Oral care;Bed level;Total  assistance (shaving with Copy)                   Toilet Transfer: Minimal assistance;BSC   Toileting- Clothing Manipulation and Hygiene: Total assistance;Sit to/from stand         General ADL Comments: Pt with impulsivity, impaired attention, and decreased ability to follow commands requiring hand over hand assist for attempts to groom, donn socks, and eat. Pt unaware of bowel incontinence, required total assist for pericare. Pt is dependent in bathing and dressing.     Vision                     Perception     Praxis      Pertinent Vitals/Pain Pain Assessment: No/denies pain Pain Score: 0-No pain     Hand Dominance Right (golfs with L)   Extremity/Trunk Assessment Upper Extremity Assessment Upper Extremity Assessment: Overall WFL for tasks assessed   Lower Extremity Assessment Lower Extremity Assessment: Overall WFL for tasks assessed   Cervical / Trunk Assessment Cervical / Trunk Assessment: Normal   Communication Communication Communication: No difficulties   Cognition Arousal/Alertness: Lethargic (kept eyes closed most of session) Behavior During Therapy: Impulsive Overall Cognitive Status: Impaired/Different from baseline Area of Impairment: Orientation;Attention;Memory;Following commands;Safety/judgement;Problem solving Orientation Level: Disoriented to;Place;Time;Situation (knew his birthday) Current Attention Level: Sustained Memory: Decreased short-term memory;Decreased recall of precautions Following Commands: Follows one step commands consistently Safety/Judgement: Decreased awareness of safety;Decreased awareness of deficits   Problem Solving: Requires verbal cues;Difficulty sequencing;Slow processing General Comments: Wife reports pt with increased confusion today.  General Comments       Exercises       Shoulder Instructions      Home Living Family/patient expects to be discharged to:: Private residence Living  Arrangements: Spouse/significant other Available Help at Discharge: Family;Available 24 hours/day Type of Home: House Home Access: Stairs to enter     Home Layout: One level     Bathroom Shower/Tub: Occupational psychologist: Standard     Home Equipment: Environmental consultant - 2 wheels;Cane - single point;Cane - quad          Prior Functioning/Environment Level of Independence: Independent        Comments: pt plays golf and drives, wife had been noticing increasing memory deficits for several months    OT Diagnosis: Generalized weakness;Cognitive deficits   OT Problem List: Decreased activity tolerance;Impaired balance (sitting and/or standing);Decreased coordination;Decreased cognition;Decreased safety awareness;Decreased knowledge of use of DME or AE;Impaired UE functional use   OT Treatment/Interventions: Self-care/ADL training;DME and/or AE instruction;Cognitive remediation/compensation;Patient/family education;Balance training;Therapeutic activities    OT Goals(Current goals can be found in the care plan section) Acute Rehab OT Goals Patient Stated Goal: unable to state OT Goal Formulation: With family Time For Goal Achievement: 03/08/14 Potential to Achieve Goals: Fair  OT Frequency: Min 2X/week   Barriers to D/C:            Co-evaluation              End of Session Equipment Utilized During Treatment: Gait belt;Rolling walker  Activity Tolerance: Patient limited by lethargy Patient left: in bed;with call bell/phone within reach;with bed alarm set;with family/visitor present   Time: 1110-1150 OT Time Calculation (min): 40 min Charges:  OT General Charges $OT Visit: 1 Procedure OT Treatments $Self Care/Home Management : 23-37 mins G-Codes:    Malka So 02/22/2014, 12:27 PM [phone]

## 2014-02-22 NOTE — Progress Notes (Signed)
Agree.  Will continue to follow drain output.

## 2014-02-22 NOTE — Progress Notes (Signed)
[facility] PROGRESS NOTE  Nathanyel Defenbaugh IOE:[id number] DOB: 1940-05-29 DOA: 02/15/2014 PCP: [name], MD  Brief Narrative: Brent Little is a 73 y.o. male, With H/O HTN, Kidney stone, who initially presented to [place] about one week ago with fever, dull right upper quadrant pain and a dry cough. He was diagnosed there with a possible community-acquired pneumonia, was placed on Levaquin, he had marginal improvement. During his fever workup he had a right upper quadrant ultrasound which showed some nonspecific changes . He was then discharged home with outpatient MRI, MRI done in the outpatient setting showed a septated cystic lesion in the right hepatic lobe concerning for abscess versus metastatic necrotic mass. MRI also showed an ascending colon circumferential mass. He presented to [facility] with fever, chills, decreased appetite and lethargy. Now being followed by CCS/GI and IR. Has been on broad spectrum Abx, finally underwent Liver biopsy 10/12,. And a drain put it.    Assessment/Plan: 1. Liver abscess vs necrotic mets  -continue current Abx-day 6 of Vanc/Zosyn -s/p US guided biopsy 10/12; yielded purulent appearing material12F drain was placed under US guidance.-Culture-gram stain-no organisms seen, FU cultures -afebrile today -Blood CX-NGTD - when drain has less than 10 ml output plan for CT abdomen and pelvis.   2. Ascending colon mass -never had colonoscopy, needs this, patient and family wants it to be done at [place] . -CEA mildly elevated  3. HTN -stable, ACE on hold  4. Anemia -anemia panel c/w Iron deficiency  5. ? Dysuria yesterday -UA benign  6. Confusion/encephalopathy -Improving, possibly from the sedating pain meds.   DVT proph: lovenox  Code Status: Full Code Family Communication: none at bedside Disposition Plan: SNF when stable   Consultants:  GI  CCS  IR  HPI/Subjective: Comfortable, cheerful. Confused. Pain is better.    Objective: Filed Vitals:   02/22/14 0455  BP: 119/52  Pulse: 59  Temp: 97.8 F (36.6 C)  Resp: 18    Intake/Output Summary (Last 24 hours) at 02/22/14 1426 Last data filed at 02/22/14 1409  Gross per 24 hour  Intake    370 ml  Output    401 ml  Net    -31 ml   Filed Weights   02/20/14 0430 02/21/14 0352 02/22/14 0455  Weight: 78.2 kg (172 lb 6.4 oz) 73.074 kg (161 lb 1.6 oz) 75.8 kg (167 lb 1.7 oz)    Exam:   General:  Confused, but alert and conversant.   Cardiovascular: S1S2/RRR  Respiratory: CTAB  Abdomen: soft, mild tenderness in RUQ, BS present  Musculoskeletal: no edema c/c   Data Reviewed: Basic Metabolic Panel:  Recent Labs Lab 02/16/14 0415 02/17/14 0026 02/19/14 0550 02/21/14 1100  NA 134* 135* 138 137  K 3.6* 3.7 3.5* 3.6*  CL 95* 94* 99 100  CO2 26 27 26 23   GLUCOSE 116* 95 105* 114*  BUN 19 17 15 16   CREATININE 1.21 1.31 1.32 1.26  CALCIUM 8.6 8.8 8.6 8.9   Liver Function Tests:  Recent Labs Lab 02/17/14 0026 02/19/14 0550 02/21/14 1100  AST 31 30 53*  ALT 23 19 33  ALKPHOS 197* 164* 209*  BILITOT 1.8* 1.3* 1.1  PROT 6.7 6.0 6.4  ALBUMIN 2.3* 2.0* 2.0*   No results found for this basename: LIPASE, AMYLASE,  in the last 168 hours No results found for this basename: AMMONIA,  in the last 168 hours CBC:  Recent Labs Lab 02/16/14 0415 02/17/14 0026 02/19/14 0550 02/21/14 1100  WBC  16.6* 16.5* 14.0* 10.5  HGB 9.0* 9.4* 8.6* 8.9*  HCT 26.8* 27.9* 25.6* 26.6*  MCV 77.7* 77.9* 77.8* 78.2  PLT 303 348 395 383   Cardiac Enzymes: No results found for this basename: CKTOTAL, CKMB, CKMBINDEX, TROPONINI,  in the last 168 hours BNP (last 3 results) No results found for this basename: PROBNP,  in the last 8760 hours CBG: No results found for this basename: GLUCAP,  in the last 168 hours  Recent Results (from the past 240 hour(s))  CULTURE, BLOOD (ROUTINE X 2)     Status: None   Collection Time    02/15/14  3:15 PM       Result Value Ref Range Status   Specimen Description BLOOD HAND RIGHT   Final   Special Requests BOTTLES DRAWN AEROBIC AND ANAEROBIC 5CC   Final   Culture  Setup Time     Final   Value: 02/15/2014 20:13     Performed at [place]     Final   Value: NO GROWTH 5 DAYS     Performed at [facility]   Report Status 02/21/2014 FINAL   Final  CULTURE, BLOOD (ROUTINE X 2)     Status: None   Collection Time    02/15/14  3:30 PM      Result Value Ref Range Status   Specimen Description BLOOD ARM LEFT   Final   Special Requests BOTTLES DRAWN AEROBIC ONLY 5CC   Final   Culture  Setup Time     Final   Value: 02/15/2014 20:12     Performed at [place]     Final   Value: NO GROWTH 5 DAYS     Performed at [facility]   Report Status 02/21/2014 FINAL   Final  GRAM STAIN     Status: None   Collection Time    02/19/14 12:17 PM      Result Value Ref Range Status   Specimen Description ABSCESS RIGHT LIVER   Final   Special Requests Normal   Final   Gram Stain     Final   Value: ABUNDANT WBC PRESENT,BOTH PMN AND MONONUCLEAR     NO ORGANISMS SEEN   Report Status 02/19/2014 FINAL   Final  CULTURE, ROUTINE-ABSCESS     Status: None   Collection Time    02/19/14 12:17 PM      Result Value Ref Range Status   Specimen Description ABSCESS RIGHT LIVER   Final   Special Requests NONE   Final   Gram Stain     Final   Value: ABUNDANT WBC PRESENT,BOTH PMN AND MONONUCLEAR     NO SQUAMOUS EPITHELIAL CELLS SEEN     NO ORGANISMS SEEN     Performed at [facility]     Performed at [place]     Final   Value: NO GROWTH 3 DAYS     Performed at [facility]   Report Status 02/22/2014 FINAL   Final  URINE CULTURE     Status: None   Collection Time    02/20/14 12:45 AM      Result Value Ref Range Status   Specimen Description URINE, CLEAN CATCH   Final   Special Requests NONE   Final   Culture  Setup Time      Final   Value: 02/20/2014 05:06     Performed at [facility]   [place] [facility]     Final   Value: NO GROWTH     Performed at [facility]     Final   Value: NO GROWTH     Performed at [facility]   Report Status 02/21/2014 FINAL   Final     Studies: No results found.  Scheduled Meds: . Influenza vac split quadrivalent PF  0.5 mL Intramuscular Tomorrow-1000  . piperacillin-tazobactam (ZOSYN)  IV  3.375 g Intravenous 3 times per day  . pneumococcal 23 valent vaccine  0.5 mL Intramuscular Tomorrow-1000  . terazosin  5 mg Oral Daily  . vancomycin  750 mg Intravenous Q12H   Continuous Infusions:   Antibiotics Given (last 72 hours)   Date/Time Action Medication Dose Rate   02/19/14 1818 Given   piperacillin-tazobactam (ZOSYN) IVPB 3.375 g 3.375 g 12.5 mL/hr   02/19/14 1822 Given   vancomycin (VANCOCIN) IVPB 750 mg/150 ml premix 750 mg 150 mL/hr   02/19/14 2326 Given   piperacillin-tazobactam (ZOSYN) IVPB 3.375 g 3.375 g 12.5 mL/hr   02/20/14 0518 Given   vancomycin (VANCOCIN) IVPB 750 mg/150 ml premix 750 mg 150 mL/hr   02/20/14 0908 Given   piperacillin-tazobactam (ZOSYN) IVPB 3.375 g 3.375 g 12.5 mL/hr   02/20/14 1552 Given   piperacillin-tazobactam (ZOSYN) IVPB 3.375 g 3.375 g 12.5 mL/hr   02/20/14 1755 Given   vancomycin (VANCOCIN) IVPB 750 mg/150 ml premix 750 mg 150 mL/hr   02/20/14 2341 Given   piperacillin-tazobactam (ZOSYN) IVPB 3.375 g 3.375 g 12.5 mL/hr   02/21/14 0504 Given   vancomycin (VANCOCIN) IVPB 750 mg/150 ml premix 750 mg 150 mL/hr   02/21/14 0815 Given   piperacillin-tazobactam (ZOSYN) IVPB 3.375 g 3.375 g 12.5 mL/hr   02/21/14 1457 Given   piperacillin-tazobactam (ZOSYN) IVPB 3.375 g 3.375 g 12.5 mL/hr   02/21/14 1754 Given   vancomycin (VANCOCIN) IVPB 750 mg/150 ml premix 750 mg 150 mL/hr   02/22/14 0033 Given   piperacillin-tazobactam (ZOSYN) IVPB 3.375 g 3.375 g 12.5 mL/hr   02/22/14 0530 Given   vancomycin  (VANCOCIN) IVPB 750 mg/150 ml premix 750 mg 150 mL/hr   02/22/14 0805 Given   piperacillin-tazobactam (ZOSYN) IVPB 3.375 g 3.375 g 12.5 mL/hr      Principal Problem:   Essential hypertension Active Problems:   Liver abscess   BPH (benign prostatic hyperplasia)   Colonic mass    Time spent: 46min    [facility]  [facility] Pager [phone]. If 7PM-7AM, please contact night-coverage at [email or web address], password [facility] 02/22/2014, 2:26 PM  LOS: 7 days

## 2014-02-23 ENCOUNTER — Inpatient Hospital Stay (HOSPITAL_COMMUNITY): Payer: Medicare Other

## 2014-02-23 LAB — BASIC METABOLIC PANEL
Anion gap: 14 (ref 5–15)
Anion gap: 13 (ref 5–15)
BUN: 18 mg/dL (ref 6–23)
BUN: 21 mg/dL (ref 6–23)
CO2: 21 meq/L (ref 19–32)
CO2: 22 meq/L (ref 19–32)
Calcium: 8.9 mg/dL (ref 8.4–10.5)
Calcium: 8.9 mg/dL (ref 8.4–10.5)
Chloride: 104 mEq/L (ref 96–112)
Chloride: 103 mEq/L (ref 96–112)
Creatinine, Ser: 1.7 mg/dL — ABNORMAL HIGH (ref 0.50–1.35)
Creatinine, Ser: 2.24 mg/dL — ABNORMAL HIGH (ref 0.50–1.35)
GFR calc Af Amer: 44 mL/min — ABNORMAL LOW (ref >90)
GFR calc Af Amer: 31 mL/min — ABNORMAL LOW (ref >90)
GFR calc non Af Amer: 38 mL/min — ABNORMAL LOW (ref >90)
GFR calc non Af Amer: 27 mL/min — ABNORMAL LOW (ref >90)
Glucose, Bld: 93 mg/dL (ref 70–99)
Glucose, Bld: 118 mg/dL — ABNORMAL HIGH (ref 70–99)
POTASSIUM: 3.9 meq/L (ref 3.7–5.3)
Potassium: 3.8 mEq/L (ref 3.7–5.3)
SODIUM: 139 meq/L (ref 137–147)
SODIUM: 138 meq/L (ref 137–147)

## 2014-02-23 LAB — HEPATIC FUNCTION PANEL
ALBUMIN: 2.1 g/dL — AB (ref 3.5–5.2)
ALT: 39 U/L (ref 0–53)
AST: 45 U/L — AB (ref 0–37)
Alkaline Phosphatase: 233 U/L — ABNORMAL HIGH (ref 39–117)
Bilirubin, Direct: 0.4 mg/dL — ABNORMAL HIGH (ref 0.0–0.3)
Indirect Bilirubin: 0.4 mg/dL (ref 0.3–0.9)
Total Bilirubin: 0.8 mg/dL (ref 0.3–1.2)
Total Protein: 6.4 g/dL (ref 6.0–8.3)

## 2014-02-23 LAB — URINALYSIS, ROUTINE W REFLEX MICROSCOPIC
Bilirubin Urine: NEGATIVE
Glucose, UA: NEGATIVE mg/dL
Hgb urine dipstick: NEGATIVE
Ketones, ur: NEGATIVE mg/dL
LEUKOCYTES UA: NEGATIVE
Nitrite: NEGATIVE
Protein, ur: NEGATIVE mg/dL
SPECIFIC GRAVITY, URINE: 1.015 (ref 1.005–1.030)
Urobilinogen, UA: 0.2 mg/dL (ref 0.0–1.0)
pH: 6 (ref 5.0–8.0)

## 2014-02-23 LAB — SODIUM, URINE, RANDOM: Sodium, Ur: 71 mEq/L

## 2014-02-23 LAB — CREATININE, URINE, RANDOM: CREATININE, URINE: 97.45 mg/dL

## 2014-02-23 MED ORDER — SODIUM CHLORIDE 0.9 % IV SOLN
INTRAVENOUS | Status: DC
  Administered 2014-02-23 – 2014-02-24 (×2): via INTRAVENOUS

## 2014-02-23 MED ORDER — TRAMADOL HCL 50 MG PO TABS
50.0000 mg | ORAL_TABLET | Freq: Four times a day (QID) | ORAL | Status: DC | PRN
  Administered 2014-02-23: 50 mg via ORAL
  Filled 2014-02-23: qty 1

## 2014-02-23 NOTE — Progress Notes (Signed)
Medicare Important Message given? YES  (If response is "NO", the following Medicare IM given date fields will be blank)  Date Medicare IM given: 02/23/2014  Medicare IM given by: [facility]

## 2014-02-23 NOTE — Progress Notes (Signed)
Patient ID: Brent Little, male   DOB: Apr 08, 1941, 73 y.o.   MRN: [id number]   Referring Physician(s): [facility]  Subjective:  Liver abscess drain placed 10/12 Feeling better daily Less confused today   Allergies: Review of patient's allergies indicates no known allergies.  Medications: Prior to Admission medications   Medication Sig Start Date End Date Taking? Authorizing Provider  hydrochlorothiazide (HYDRODIURIL) 25 MG tablet Take 25 mg by mouth daily.   Yes Historical Provider, MD  potassium chloride SA (K-DUR,KLOR-CON) 20 MEQ tablet Take 20 mEq by mouth daily.  02/09/14  Yes Historical Provider, MD  ramipril (ALTACE) 10 MG capsule Take 10 mg by mouth daily.  01/29/14  Yes Historical Provider, MD  terazosin (HYTRIN) 5 MG capsule Take 5 mg by mouth daily.  01/29/14  Yes Historical Provider, MD    Review of Systems  Vital Signs: BP 143/51  Pulse 63  Temp(Src) 97.8 F (36.6 C) (Axillary)  Resp 16  Ht 6' (1.829 m)  Wt 75.2 kg (165 lb 12.6 oz)  BMI 22.48 kg/m2  SpO2 99%  Physical Exam  Abdominal:  Site of drain intact Clean and dry; NT No bleeding Output bloodyish 20 cc yesterday afeb cx no growth    Imaging: [place] Abscess Drain  02/20/2014   CLINICAL DATA:  73 year old male with complex fluid collection/ mass in the posterior right liver. Differential considerations include hepatic abscess and necrotic hepatic metastatic lesion. Ultrasound-guided biopsy/aspiration versus drain placement is warranted.  EXAM: ULTRASOUND GUIDED ABSCESS DRAINAGE  Date: 02/20/2014  PROCEDURE: 1. Ultrasound-guided aspiration of complex fluid collection in the right hepatic lobe 2. Ultrasound-guided placement of a 12 French percutaneous drainage catheter Interventional Radiologist:  Criselda Peaches, MD  ANESTHESIA/SEDATION: Moderate (conscious) sedation was used. 1 mg Versed, 25 mcg Fentanyl were administered intravenously. The patient's vital signs were monitored continuously by radiology nursing  throughout the procedure.  Sedation Time: 25 minutes  The patient is currently on intravenous vancomycin and Zosyn. The the last Zosyn infusion was completed within the hr prior to the procedure.  TECHNIQUE: Informed consent was obtained from the patient following explanation of the procedure, risks, benefits and alternatives. The patient understands, agrees and consents for the procedure. All questions were addressed. A time out was performed.  The right upper quadrant was interrogated with ultrasound. There is a large complex fluid collection in the posterior aspect of the right hemi liver. No definite solid mural nodularity to suggest a necrotic mass. A suitable skin entry site was selected and marked. The region was then sterilely prepped and draped in the standard fashion using Betadine skin prep.  Local anesthesia was attained by infiltration with 1% lidocaine. Under real-time sonographic guidance, an 18 gauge trocar needle was advanced through a tract of normal hepatic parenchyma and into the complex fluid collection. Aspiration was performed revealing thick purulent material. The appearance of the aspirated material is most suggestive of hepatic abscess. Therefore, a 0.035 Amplatz wire was advanced through the trocar needle and coiled within the complex fluid collection. The tract was then dilated to 79 [place] and a Cook 12 [place] multipurpose drainage catheter advanced over the wire and formed within the fluid collection. A total of 115 mL of thick, purulent material was successfully aspirated. A sample was sent for culture and a second sample sent for cytology.  The drainage catheter was gently flash thin connected to JP bulb suction. The catheter was then secured to the skin with 0 Prolene suture and an adhesive fixation device. Post aspiration  ultrasound imaging demonstrates near-total resolution of the fluid collection.  The patient tolerated the procedure well.  COMPLICATIONS: None immediate   IMPRESSION: 1. Ultrasound evaluation demonstrates a complex cystic collection in the posterior right hemi liver. No mural nodularity or sonographic features to suggest the presence of metastatic tissue. 2. Initial ultrasound guided aspiration revealed thick, purulent material further strengthening the diagnosis of hepatic abscess. 3. Placement of a 93 French percutaneous drainage catheter with aspiration of 115 mL of thick, purulent material. Samples were sent both for cytology and culture.  PLAN: Maintain tube to JP bulb suction and flush 3 times daily with a small volume (5-10 mL) sterile saline. Follow cultures and titrate antibiotics accordingly. Prior to removal of the tube, recommend repeat CT scan of the abdomen with contrast material to assess for resolution of the hepatic abscess. Patient will be followed in the Interventional Radiology [facility].  Signed,  Criselda Peaches, MD  [facility]  [facility]   Electronically Signed   By: Jacqulynn Cadet M.D.   On: 02/20/2014 08:41    Labs:  CBC:  Recent Labs  02/16/14 0415 02/17/14 0026 02/19/14 0550 02/21/14 1100  WBC 16.6* 16.5* 14.0* 10.5  HGB 9.0* 9.4* 8.6* 8.9*  HCT 26.8* 27.9* 25.6* 26.6*  PLT 303 348 395 383    COAGS:  Recent Labs  02/15/14 1243 02/15/14 1539 02/19/14 0550  INR 1.24 1.18 1.41  APTT 35  --  46*    BMP:  Recent Labs  02/17/14 0026 02/19/14 0550 02/21/14 1100 02/23/14 0350  NA 135* 138 137 139  K 3.7 3.5* 3.6* 3.9  CL 94* 99 100 104  CO2 27 26 23 21   GLUCOSE 95 105* 114* 93  BUN 17 15 16 18   CALCIUM 8.8 8.6 8.9 8.9  CREATININE 1.31 1.32 1.26 1.70*  GFRNONAA 52* 51* 54* 38*  GFRAA 60* 60* 63* 44*    LIVER FUNCTION TESTS:  Recent Labs  02/15/14 1243 02/17/14 0026 02/19/14 0550 02/21/14 1100  BILITOT 0.7 1.8* 1.3* 1.1  AST 42* 31 30 53*  ALT 31 23 19  33  ALKPHOS 209* 197* 164* 209*  PROT 6.3 6.7 6.0 6.4  ALBUMIN 2.1* 2.3* 2.0*  2.0*    Assessment and Plan:  Liver abscess drain placed 10/12 Output decreasing Will need Re CT when output 10-15 cc per day   I spent a total of 15 minutes face to face in clinical consultation/evaluation, greater than 50% of which was counseling/coordinating care for liver abscess drain  Signed: Inmer Nix A 02/23/2014, 7:38 AM

## 2014-02-23 NOTE — Progress Notes (Signed)
Occupational Therapy Treatment Patient Details Name: Brent Little MRN: [id number] DOB: 11/14/1940 Today's Date: 02/23/2014    History of present illness Pt admitted with fever chills, decreased appetite and lethargy.  Liver mass noted on CT and diagnosed as an abcess after biopsy.   OT comments  Remarkable gains in arousal.  Continues to have some confusion, but to a lesser degree.  Pt able to donn his socks and undergarment with min assist.  He was aware of need to use bathroom, managed his undergarment and performed pericare with min assist, primarily for balance. Stood at sink x 5 minutes.  Will continue to follow.  Follow Up Recommendations  SNF;Supervision/Assistance - 24 hour    Equipment Recommendations       Recommendations for Other Services      Precautions / Restrictions Precautions Precautions: Fall Restrictions Weight Bearing Restrictions: No       Mobility Bed Mobility Overal bed mobility: Needs Assistance Bed Mobility: Sit to Supine     Supine to sit: Supervision;HOB elevated     General bed mobility comments: no physical assist needed  Transfers Overall transfer level: Needs assistance Equipment used: Rolling walker (2 wheeled) Transfers: Sit to/from Stand Sit to Stand: Min assist         General transfer comment: retropulsive with initial stance    Balance   Sitting-balance support: No upper extremity supported;Feet supported Sitting balance-Leahy Scale: Good     Standing balance support: Bilateral upper extremity supported Standing balance-Leahy Scale: Fair                     ADL Overall ADL's : Needs assistance/impaired Eating/Feeding: Supervision/ safety;Sitting   Grooming: Wash/dry hands;Minimal assistance;Standing;Brushing hair (verbal cues for sequencing, stood x 5 minutes)           Upper Body Dressing : Minimal assistance;Sitting   Lower Body Dressing: Minimal assistance;Sit to/from stand (adult diaper and socks)    Toilet Transfer: Minimal assistance;BSC;Ambulation (over toilet)   Toileting- Clothing Manipulation and Hygiene: Minimal assistance;Sit to/from stand (assisted for balance in standing as he wiped)       Functional mobility during ADLs: +2 for physical assistance;Minimal assistance;Rolling walker General ADL Comments: Difficulty sequencing grooming, impaired balance interfering with standing ADL.      Vision                     Perception     Praxis      Cognition   Behavior During Therapy: Impulsive Overall Cognitive Status: Impaired/Different from baseline Area of Impairment: Orientation;Attention;Memory;Safety/judgement;Awareness;Problem solving Orientation Level: Disoriented to;Place;Time Little Attention Level: Selective Memory: Decreased short-term memory;Decreased recall of precautions  Following Commands: Follows multi-step commands consistently Safety/Judgement: Decreased awareness of safety;Decreased awareness of deficits Awareness: Intellectual Problem Solving: Difficulty sequencing;Requires verbal cues General Comments: Significant improvement in pt's level of arousal, appropriateness of conversation, and ability to communicate his needs.    Extremity/Trunk Assessment               Exercises     Shoulder Instructions       General Comments      Pertinent Vitals/ Pain       Pain Assessment: Faces Faces Pain Scale: Hurts little more Pain Location: R flank Pain Intervention(s): Monitored during session;Repositioned  Home Living  Prior Functioning/Environment              Frequency Min 2X/week     Progress Toward Goals  OT Goals(Little goals can now be found in the care plan section)  Progress towards OT goals: Progressing toward goals  Acute Rehab OT Goals Patient Stated Goal: unable to state Time For Goal Achievement: 03/08/14 Potential to Achieve Goals: Good ADL  Goals Pt Will Perform Grooming: with supervision;standing (3 activities) Pt Will Perform Upper Body Bathing: with supervision;sitting Pt Will Perform Lower Body Bathing: with supervision;sit to/from stand Pt Will Perform Upper Body Dressing: with supervision;sitting Pt Will Perform Lower Body Dressing: with supervision;sit to/from stand Pt Will Transfer to Toilet: with supervision;ambulating;bedside commode (over toilet) Pt Will Perform Toileting - Clothing Manipulation and hygiene: with supervision;sit to/from stand Additional ADL Goal #1: Pt will use environmental cues for orientation to place and time.  Plan Discharge plan remains appropriate    Co-evaluation      Reason for Co-Treatment: Necessary to address cognition/behavior during functional activity;For patient/therapist safety PT goals addressed during session: Mobility/safety with mobility;Balance;Proper use of DME        End of Session Equipment Utilized During Treatment: Gait belt;Rolling walker   Activity Tolerance Patient tolerated treatment well   Patient Left in chair;with call bell/phone within reach;with nursing/sitter in room;with family/visitor present   Nurse Communication Mobility status        Time: 6010-9323 OT Time Calculation (min): 29 min  Charges: OT General Charges $OT Visit: 1 Procedure OT Treatments $Self Care/Home Management : 8-22 mins  Malka So 02/23/2014, 10:25 AM [phone]

## 2014-02-23 NOTE — Clinical Social Work Note (Signed)
Social worker received SNF consult for patient unable to assess today report left for weekend CSW.  Jones Broom. [place], MSW, [place] 02/23/2014 5:13 PM

## 2014-02-23 NOTE — Progress Notes (Signed)
Physical Therapy Treatment Patient Details Name: Brent Little MRN: [id number] DOB: 08-Jun-1940 Today's Date: 02/23/2014    History of Present Illness Pt admitted with fever chills, decreased appetite and lethargy.  Liver mass noted on CT and diagnosed as an abcess after biopsy.    PT Comments    Excellent progress noted today.  Pt with significantly less confusion and more appropriate with responses.  He demo an improvement in safety awareness but is still a fall risk.  PT to continue per POC.  Follow Up Recommendations  SNF;Supervision/Assistance - 24 hour     Equipment Recommendations  None recommended by PT    Recommendations for Other Services       Precautions / Restrictions Precautions Precautions: Fall Restrictions Weight Bearing Restrictions: No    Mobility  Bed Mobility Overal bed mobility: Needs Assistance Bed Mobility: Sit to Supine     Supine to sit: Supervision;HOB elevated     General bed mobility comments: no physical assist needed  Transfers Overall transfer level: Needs assistance Equipment used: Rolling walker (2 wheeled) Transfers: Sit to/from Stand Sit to Stand: Min assist         General transfer comment: retropulsive with initial stance  Ambulation/Gait Ambulation/Gait assistance: Min assist Ambulation Distance (Feet): 225 Feet Assistive device: Rolling walker (2 wheeled) Gait Pattern/deviations: Narrow base of support;Decreased stride length Gait velocity: decreased   General Gait Details: LOB x 1 with min assist to recover   Stairs            Wheelchair Mobility    Modified Rankin (Stroke Patients Only)       Balance   Sitting-balance support: No upper extremity supported;Feet supported Sitting balance-Leahy Scale: Good     Standing balance support: Bilateral upper extremity supported Standing balance-Leahy Scale: Fair                      Cognition Arousal/Alertness: Awake/alert Behavior During  Therapy: Impulsive Overall Cognitive Status: Impaired/Different from baseline Area of Impairment: Orientation;Attention;Memory;Safety/judgement;Awareness;Problem solving Orientation Level: Disoriented to;Place;Time Current Attention Level: Selective Memory: Decreased short-term memory;Decreased recall of precautions Following Commands: Follows multi-step commands consistently Safety/Judgement: Decreased awareness of safety;Decreased awareness of deficits Awareness: Intellectual Problem Solving: Difficulty sequencing;Requires verbal cues General Comments: Significant improvement in pt's level of arousal, appropriateness of conversation, and ability to communicate his needs.    Exercises      General Comments        Pertinent Vitals/Pain Pain Assessment: Faces Faces Pain Scale: Hurts little more Pain Location: R flank Pain Intervention(s): Monitored during session;Repositioned    Home Living                      Prior Function            PT Goals (current goals can now be found in the care plan section) Acute Rehab PT Goals Patient Stated Goal: unable to state Progress towards PT goals: Progressing toward goals    Frequency  Min 3X/week    PT Plan Current plan remains appropriate    Co-evaluation PT/OT/SLP Co-Evaluation/Treatment: Yes Reason for Co-Treatment: Necessary to address cognition/behavior during functional activity;For patient/therapist safety PT goals addressed during session: Mobility/safety with mobility;Balance;Proper use of DME       End of Session Equipment Utilized During Treatment: Gait belt Activity Tolerance: Patient tolerated treatment well Patient left: in chair;with family/visitor present;with nursing/sitter in room;with call bell/phone within reach     Time: 0160-1093 PT Time Calculation (min): 31 min  Charges:  $Gait Training: 8-22 mins                    G Codes:      Brent Little 02/23/2014, 10:23 AM

## 2014-02-23 NOTE — Progress Notes (Signed)
[facility] PROGRESS NOTE  [name] SPQ:[id number] DOB: Nov 05, 1940 DOA: 02/15/2014 PCP: [name], MD  Brief Narrative: Brent Little is a 73 y.o. male, With H/O HTN, Kidney stone, who initially presented to [place] about one week ago with fever, dull right upper quadrant pain and a dry cough. He was diagnosed there with a possible community-acquired pneumonia, was placed on Levaquin, he had marginal improvement. During his fever workup he had a right upper quadrant ultrasound which showed some nonspecific changes . He was then discharged home with outpatient MRI, MRI done in the outpatient setting showed a septated cystic lesion in the right hepatic lobe concerning for abscess versus metastatic necrotic mass. MRI also showed an ascending colon circumferential mass. He presented to [facility] with fever, chills, decreased appetite and lethargy. Now being followed by CCS/GI and IR. Has been on broad spectrum Abx, finally underwent Liver biopsy 10/12,. And a drain put it.    Assessment/Plan: 1. Liver abscess vs necrotic mets  -continue current Abx-day 8 of Vanc/Zosyn -s/p US guided biopsy 10/12; yielded purulent appearing material12F drain was placed under US guidance.-Culture-gram stain-no organisms seen, FU cultures -afebrile today -Blood CX-NGTD - when drain has less than 10 ml output plan for CT abdomen and pelvis.   2. Ascending colon mass -never had colonoscopy, needs this, patient and family wants it to be done at [place] . -CEA mildly elevated  3. HTN -stable, ACE on hold  4. Anemia -anemia panel c/w Iron deficiency  5. ? Dysuria yesterday -UA benign  6. Confusion/encephalopathy -Improving, possibly from the sedating pain meds.   7. Acute renal failure: unclear etiology. Will get US renal. And get UA and urine electrolytes.   measure Intake and output and gentle hydration. Repeat renal parameters later today.   DVT proph: lovenox  Code Status: Full  Code Family Communication: none at bedside Disposition Plan: SNF when stable   Consultants:  GI  CCS  IR  HPI/Subjective: Comfortable, cheerful. Mental status clearing up.  Pain is better.   Objective: Filed Vitals:   02/23/14 1120  BP: 118/58  Pulse:   Temp:   Resp:     Intake/Output Summary (Last 24 hours) at 02/23/14 1150 Last data filed at 02/23/14 0804  Gross per 24 hour  Intake    803 ml  Output    795 ml  Net      8 ml   Filed Weights   02/21/14 0352 02/22/14 0455 02/23/14 0534  Weight: 73.074 kg (161 lb 1.6 oz) 75.8 kg (167 lb 1.7 oz) 75.2 kg (165 lb 12.6 oz)    Exam:   General:  Confused, but alert and conversant.   Cardiovascular: S1S2/RRR  Respiratory: CTAB  Abdomen: soft, mild tenderness in RUQ, BS present  Musculoskeletal: no edema c/c   Data Reviewed: Basic Metabolic Panel:  Recent Labs Lab 02/17/14 0026 02/19/14 0550 02/21/14 1100 02/23/14 0350  NA 135* 138 137 139  K 3.7 3.5* 3.6* 3.9  CL 94* 99 100 104  CO2 27 26 23 21   GLUCOSE 95 105* 114* 93  BUN 17 15 16 18   CREATININE 1.31 1.32 1.26 1.70*  CALCIUM 8.8 8.6 8.9 8.9   Liver Function Tests:  Recent Labs Lab 02/17/14 0026 02/19/14 0550 02/21/14 1100  AST 31 30 53*  ALT 23 19 33  ALKPHOS 197* 164* 209*  BILITOT 1.8* 1.3* 1.1  PROT 6.7 6.0 6.4  ALBUMIN 2.3* 2.0* 2.0*   No results found for this basename: LIPASE,  AMYLASE,  in the last 168 hours No results found for this basename: AMMONIA,  in the last 168 hours CBC:  Recent Labs Lab 02/17/14 0026 02/19/14 0550 02/21/14 1100  WBC 16.5* 14.0* 10.5  HGB 9.4* 8.6* 8.9*  HCT 27.9* 25.6* 26.6*  MCV 77.9* 77.8* 78.2  PLT 348 395 383   Cardiac Enzymes: No results found for this basename: CKTOTAL, CKMB, CKMBINDEX, TROPONINI,  in the last 168 hours BNP (last 3 results) No results found for this basename: PROBNP,  in the last 8760 hours CBG: No results found for this basename: GLUCAP,  in the last 168  hours  Recent Results (from the past 240 hour(s))  CULTURE, BLOOD (ROUTINE X 2)     Status: None   Collection Time    02/15/14  3:15 PM      Result Value Ref Range Status   Specimen Description BLOOD HAND RIGHT   Final   Special Requests BOTTLES DRAWN AEROBIC AND ANAEROBIC 5CC   Final   Culture  Setup Time     Final   Value: 02/15/2014 20:13     Performed at [facility]     Final   Value: NO GROWTH 5 DAYS     Performed at [facility]   Report Status 02/21/2014 FINAL   Final  CULTURE, BLOOD (ROUTINE X 2)     Status: None   Collection Time    02/15/14  3:30 PM      Result Value Ref Range Status   Specimen Description BLOOD ARM LEFT   Final   Special Requests BOTTLES DRAWN AEROBIC ONLY 5CC   Final   Culture  Setup Time     Final   Value: 02/15/2014 20:12     Performed at [facility]   Culture     Final   Value: NO GROWTH 5 DAYS     Performed at [facility]   Report Status 02/21/2014 FINAL   Final  GRAM STAIN     Status: None   Collection Time    02/19/14 12:17 PM      Result Value Ref Range Status   Specimen Description ABSCESS RIGHT LIVER   Final   Special Requests Normal   Final   Gram Stain     Final   Value: ABUNDANT WBC PRESENT,BOTH PMN AND MONONUCLEAR     NO ORGANISMS SEEN   Report Status 02/19/2014 FINAL   Final  CULTURE, ROUTINE-ABSCESS     Status: None   Collection Time    02/19/14 12:17 PM      Result Value Ref Range Status   Specimen Description ABSCESS RIGHT LIVER   Final   Special Requests NONE   Final   Gram Stain     Final   Value: ABUNDANT WBC PRESENT,BOTH PMN AND MONONUCLEAR     NO SQUAMOUS EPITHELIAL CELLS SEEN     NO ORGANISMS SEEN     Performed at [facility]     Performed at [facility]   Culture     Final   Value: NO GROWTH 3 DAYS     Performed at [facility]   Report Status 02/22/2014 FINAL   Final  URINE CULTURE     Status: None   Collection Time    02/20/14 12:45 AM       Result Value Ref Range Status   Specimen Description URINE, CLEAN CATCH   Final   Special Requests  NONE   Final   Culture  Setup Time     Final   Value: 02/20/2014 05:06     Performed at [place]     Final   Value: NO GROWTH     Performed at [facility]   Culture     Final   Value: NO GROWTH     Performed at [facility]   Report Status 02/21/2014 FINAL   Final     Studies: No results found.  Scheduled Meds: . Influenza vac split quadrivalent PF  0.5 mL Intramuscular Tomorrow-1000  . piperacillin-tazobactam (ZOSYN)  IV  3.375 g Intravenous 3 times per day  . pneumococcal 23 valent vaccine  0.5 mL Intramuscular Tomorrow-1000  . terazosin  5 mg Oral Daily  . vancomycin  750 mg Intravenous Q12H   Continuous Infusions:   Antibiotics Given (last 72 hours)   Date/Time Action Medication Dose Rate   02/20/14 1552 Given   piperacillin-tazobactam (ZOSYN) IVPB 3.375 g 3.375 g 12.5 mL/hr   02/20/14 1755 Given   vancomycin (VANCOCIN) IVPB 750 mg/150 ml premix 750 mg 150 mL/hr   02/20/14 2341 Given   piperacillin-tazobactam (ZOSYN) IVPB 3.375 g 3.375 g 12.5 mL/hr   02/21/14 0504 Given   vancomycin (VANCOCIN) IVPB 750 mg/150 ml premix 750 mg 150 mL/hr   02/21/14 0815 Given   piperacillin-tazobactam (ZOSYN) IVPB 3.375 g 3.375 g 12.5 mL/hr   02/21/14 1457 Given   piperacillin-tazobactam (ZOSYN) IVPB 3.375 g 3.375 g 12.5 mL/hr   02/21/14 1754 Given   vancomycin (VANCOCIN) IVPB 750 mg/150 ml premix 750 mg 150 mL/hr   02/22/14 0033 Given   piperacillin-tazobactam (ZOSYN) IVPB 3.375 g 3.375 g 12.5 mL/hr   02/22/14 0530 Given   vancomycin (VANCOCIN) IVPB 750 mg/150 ml premix 750 mg 150 mL/hr   02/22/14 0805 Given   piperacillin-tazobactam (ZOSYN) IVPB 3.375 g 3.375 g 12.5 mL/hr   02/22/14 1700 Given   piperacillin-tazobactam (ZOSYN) IVPB 3.375 g 3.375 g 12.5 mL/hr   02/22/14 1834 Given   vancomycin (VANCOCIN) IVPB 750 mg/150 ml premix  750 mg 150 mL/hr   02/22/14 2346 Given   piperacillin-tazobactam (ZOSYN) IVPB 3.375 g 3.375 g 12.5 mL/hr   02/23/14 0618 Given   vancomycin (VANCOCIN) IVPB 750 mg/150 ml premix 750 mg 150 mL/hr   02/23/14 0957 Given   piperacillin-tazobactam (ZOSYN) IVPB 3.375 g 3.375 g 12.5 mL/hr      Principal Problem:   Essential hypertension Active Problems:   Liver abscess   BPH (benign prostatic hyperplasia)   Colonic mass    Time spent: 45min    [facility]  [facility] Pager [phone]. If 7PM-7AM, please contact night-coverage at [email or web address], password [facility] 02/23/2014, 11:50 AM  LOS: 8 days

## 2014-02-24 ENCOUNTER — Encounter (HOSPITAL_COMMUNITY): Payer: Self-pay | Admitting: Internal Medicine

## 2014-02-24 DIAGNOSIS — N179 Acute kidney failure, unspecified: Secondary | ICD-10-CM

## 2014-02-24 HISTORY — DX: Acute kidney failure, unspecified: N17.9

## 2014-02-24 LAB — BASIC METABOLIC PANEL
ANION GAP: 16 — AB (ref 5–15)
BUN: 23 mg/dL (ref 6–23)
CHLORIDE: 105 meq/L (ref 96–112)
CO2: 19 meq/L (ref 19–32)
CREATININE: 2.8 mg/dL — AB (ref 0.50–1.35)
Calcium: 8.9 mg/dL (ref 8.4–10.5)
GFR calc Af Amer: 24 mL/min — ABNORMAL LOW (ref >90)
GFR calc non Af Amer: 21 mL/min — ABNORMAL LOW (ref >90)
GLUCOSE: 101 mg/dL — AB (ref 70–99)
Potassium: 4 mEq/L (ref 3.7–5.3)
Sodium: 140 mEq/L (ref 137–147)

## 2014-02-24 LAB — CREATININE, URINE, RANDOM: Creatinine, Urine: 104.78 mg/dL

## 2014-02-24 LAB — VANCOMYCIN, TROUGH: VANCOMYCIN TR: 47.3 ug/mL — AB (ref 10.0–20.0)

## 2014-02-24 MED ORDER — SODIUM CHLORIDE 0.9 % IV SOLN
INTRAVENOUS | Status: DC
  Administered 2014-02-24: 17:00:00 via INTRAVENOUS
  Administered 2014-02-24: 1000 mL via INTRAVENOUS
  Administered 2014-02-25 (×2): via INTRAVENOUS
  Administered 2014-02-27: 100 mL/h via INTRAVENOUS

## 2014-02-24 NOTE — Progress Notes (Addendum)
Dr. Karleen Hampshire paged, critical vanc trough. Sherrie Mustache 4:49 PM   Vancomycin stopped per MD. Sherrie Mustache 5:03 PM

## 2014-02-24 NOTE — Progress Notes (Signed)
Patient ID: Brent Little, male   DOB: 05/29/1940, 73 y.o.   MRN: [id number]   Referring Physician(s): [facility]  Subjective:  Liver abscess drain placed 10/12 Output decreasing afeb Wbc wnl Still with some confusion  Allergies: Review of patient's allergies indicates no known allergies.  Medications: Prior to Admission medications   Medication Sig Start Date End Date Taking? Authorizing Provider  hydrochlorothiazide (HYDRODIURIL) 25 MG tablet Take 25 mg by mouth daily.   Yes Historical Provider, MD  potassium chloride SA (K-DUR,KLOR-CON) 20 MEQ tablet Take 20 mEq by mouth daily.  02/09/14  Yes Historical Provider, MD  ramipril (ALTACE) 10 MG capsule Take 10 mg by mouth daily.  01/29/14  Yes Historical Provider, MD  terazosin (HYTRIN) 5 MG capsule Take 5 mg by mouth daily.  01/29/14  Yes Historical Provider, MD    Review of Systems  Vital Signs: BP 131/63  Pulse 87  Temp(Src) 98.6 F (37 C) (Oral)  Resp 18  Ht 6' (1.829 m)  Wt 78.2 kg (172 lb 6.4 oz)  BMI 23.38 kg/m2  SpO2 97%  Physical Exam  Abdominal:  Site of drain intact Clean and dry NT No bleeding Output 0 yesterday Scant in JP Serous color Cx no growth afeb    Imaging: US Renal  02/24/2014   CLINICAL DATA:  73 year old male with acute renal failure. Hypertension.  EXAM: RENAL/URINARY TRACT ULTRASOUND COMPLETE  COMPARISON:  02/13/2014 abdominal MR  FINDINGS: Right Kidney:  Length: 12.2 cm.  No hydronephrosis.  Cysts measure up to 2 cm.  Left Kidney:  Length: 13.6 cm.  No hydronephrosis.  Cysts measure up to 4.2 cm.  Bladder:  Enlarged prostate gland. Clinical and laboratory correlation recommended. No gross urinary bladder abnormality detected.  IMPRESSION: No hydronephrosis.  Bilateral renal cysts larger on the left.  Enlarged prostate gland. Clinical and laboratory correlation recommended   Electronically Signed   By: Chauncey Cruel M.D.   On: 02/24/2014 03:20    Labs:  CBC:  Recent Labs  02/16/14 0415  02/17/14 0026 02/19/14 0550 02/21/14 1100  WBC 16.6* 16.5* 14.0* 10.5  HGB 9.0* 9.4* 8.6* 8.9*  HCT 26.8* 27.9* 25.6* 26.6*  PLT 303 348 395 383    COAGS:  Recent Labs  02/15/14 1243 02/15/14 1539 02/19/14 0550  INR 1.24 1.18 1.41  APTT 35  --  46*    BMP:  Recent Labs  02/21/14 1100 02/23/14 0350 02/23/14 1929 02/24/14 0510  NA 137 139 138 140  K 3.6* 3.9 3.8 4.0  CL 100 104 103 105  CO2 23 21 22 19   GLUCOSE 114* 93 118* 101*  BUN 16 18 21 23   CALCIUM 8.9 8.9 8.9 8.9  CREATININE 1.26 1.70* 2.24* 2.80*  GFRNONAA 54* 38* 27* 21*  GFRAA 63* 44* 31* 24*    LIVER FUNCTION TESTS:  Recent Labs  02/17/14 0026 02/19/14 0550 02/21/14 1100 02/23/14 1929  BILITOT 1.8* 1.3* 1.1 0.8  AST 31 30 53* 45*  ALT 23 19 33 39  ALKPHOS 197* 164* 209* 233*  PROT 6.7 6.0 6.4 6.4  ALBUMIN 2.3* 2.0* 2.0* 2.1*    Assessment and Plan:  Liver abscess drain intact Output minimal; afeb; wbc wnl Would consider repeat CT next 24-48 hrs Will follow   I spent a total of 15 minutes face to face in clinical consultation/evaluation, greater than 50% of which was counseling/coordinating care for liver abscess drain  Signed: Asianna Brundage A 02/24/2014, 10:30 AM

## 2014-02-24 NOTE — Clinical Social Work Psychosocial (Signed)
Clinical Social Work Department BRIEF PSYCHOSOCIAL ASSESSMENT 02/24/2014  Patient:  Brent Little,Brent Little     Account Number:  [id number]     Admit date:  02/15/2014  Clinical Social Worker:  Daiva Huge  Date/Time:  02/24/2014 03:47 PM  Referred by:  Physician  Date Referred:  02/24/2014 Referred for  SNF Placement   Other Referral:   Interview type:  Other - See comment Other interview type:   Met with patient, wife, 2 sons and daughter in law    PSYCHOSOCIAL DATA Living Status:  FAMILY Admitted from facility:   Level of care:   Primary support name:  wife- Brent Little Primary support relationship to patient:  SPOUSE Degree of support available:   good    CURRENT CONCERNS Current Concerns  Post-Acute Placement   Other Concerns:    SOCIAL WORK ASSESSMENT / PLAN Met with patient and wife along with hospital sitter, 2 sons and a daughter in law. Patient reports that he lives at home with his wife (of 67 years) in [place], [place]. Patient is a little disoriented but in good spirits- Discussed recommendations for SNF at dc for rehab- all are in agreement at this time and prefer near home- preferences being [facility] in [place] and [place] in [place].   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 Patients appetite has been poor per family- wife may attempt to bring him a home made dish to see if this helps. CSW will follow up with offers for selection and probable placement  into next week.     Eduard Clos, MSW, [place] [phone]/weekend coverage

## 2014-02-24 NOTE — Progress Notes (Signed)
[facility] PROGRESS NOTE  Brent Little FYB:[id number] DOB: [birth year] DOA: 02/15/2014 PCP: [name], MD  Brief Narrative: Brent Little is a 73 y.o. male, With H/O HTN, Kidney stone, who initially presented to [place] about one week ago with fever, dull right upper quadrant pain and a dry cough. He was diagnosed there with a possible community-acquired pneumonia, was placed on Levaquin, he had marginal improvement. During his fever workup he had a right upper quadrant ultrasound which showed some nonspecific changes . He was then discharged home with outpatient MRI, MRI done in the outpatient setting showed a septated cystic lesion in the right hepatic lobe concerning for abscess versus metastatic necrotic mass. MRI also showed an ascending colon circumferential mass. He presented to [facility] with fever, chills, decreased appetite and lethargy. Now being followed by CCS/GI and IR. Has been on broad spectrum Abx, finally underwent Liver biopsy 10/12,. And a drain put it.    Assessment/Plan: 1. Liver abscess vs necrotic mets  -continue current Abx-day 8 of Vanc/Zosyn -s/p US guided biopsy 10/12; yielded purulent appearing material12F drain was placed under US guidance.-Culture-gram stain-no organisms seen, FU cultures -afebrile today -Blood CX-NGTD - when drain has less than 10 ml output plan for CT abdomen and pelvis, possibly tomorrow.   2. Ascending colon mass -never had colonoscopy, needs this, patient and family wants it to be done at [place] . -CEA mildly elevated  3. HTN -stable, ACE on hold  4. Anemia -anemia panel c/w Iron deficiency  5. ? Dysuria yesterday -UA benign  6. Confusion/encephalopathy -Improving, possibly from the sedating pain meds.   7. Acute renal failure: secondary to vancomycin. US RENAL is negative for hydronephrosis. UA is negative. Vancomycin troough is 47.3  measure Intake and output and gentle hydration. Repeat renal parameters later today.    DVT proph: lovenox  Code Status: Full Code Family Communication: none at bedside Disposition Plan: SNF when stable   Consultants:  GI  CCS  IR  HPI/Subjective: Comfortable, cheerful. Mental status clearing up.  Pain is better.   Objective: Filed Vitals:   02/24/14 1433  BP: 129/57  Pulse: 64  Temp: 98.6 F (37 C)  Resp: 18    Intake/Output Summary (Last 24 hours) at 02/24/14 1829 Last data filed at 02/24/14 0205  Gross per 24 hour  Intake      0 ml  Output    250 ml  Net   -250 ml   Filed Weights   02/22/14 0455 02/23/14 0534 02/24/14 0529  Weight: 75.8 kg (167 lb 1.7 oz) 75.2 kg (165 lb 12.6 oz) 78.2 kg (172 lb 6.4 oz)    Exam:   General:  Confused, but alert and conversant.   Cardiovascular: S1S2/RRR  Respiratory: CTAB  Abdomen: soft, NO tenderness in RUQ, BS present, drain  Musculoskeletal: no edema c/c   Data Reviewed: Basic Metabolic Panel:  Recent Labs Lab 02/19/14 0550 02/21/14 1100 02/23/14 0350 02/23/14 1929 02/24/14 0510  NA 138 137 139 138 140  K 3.5* 3.6* 3.9 3.8 4.0  CL 99 100 104 103 105  CO2 26 23 21 22 19   GLUCOSE 105* 114* 93 118* 101*  BUN 15 16 18 21 23   CREATININE 1.32 1.26 1.70* 2.24* 2.80*  CALCIUM 8.6 8.9 8.9 8.9 8.9   Liver Function Tests:  Recent Labs Lab 02/19/14 0550 02/21/14 1100 02/23/14 1929  AST 30 53* 45*  ALT 19 33 39  ALKPHOS 164* 209* 233*  BILITOT 1.3* 1.1 0.8  PROT 6.0 6.4 6.4  ALBUMIN 2.0* 2.0* 2.1*   No results found for this basename: LIPASE, AMYLASE,  in the last 168 hours No results found for this basename: AMMONIA,  in the last 168 hours CBC:  Recent Labs Lab 02/19/14 0550 02/21/14 1100  WBC 14.0* 10.5  HGB 8.6* 8.9*  HCT 25.6* 26.6*  MCV 77.8* 78.2  PLT 395 383   Cardiac Enzymes: No results found for this basename: CKTOTAL, CKMB, CKMBINDEX, TROPONINI,  in the last 168 hours BNP (last 3 results) No results found for this basename: PROBNP,  in the last 8760  hours CBG: No results found for this basename: GLUCAP,  in the last 168 hours  Recent Results (from the past 240 hour(s))  CULTURE, BLOOD (ROUTINE X 2)     Status: None   Collection Time    02/15/14  3:15 PM      Result Value Ref Range Status   Specimen Description BLOOD HAND RIGHT   Final   Special Requests BOTTLES DRAWN AEROBIC AND ANAEROBIC 5CC   Final   Culture  Setup Time     Final   Value: 02/15/2014 20:13     Performed at [facility]   Culture     Final   Value: NO GROWTH 5 DAYS     Performed at [facility]   Report Status 02/21/2014 FINAL   Final  CULTURE, BLOOD (ROUTINE X 2)     Status: None   Collection Time    02/15/14  3:30 PM      Result Value Ref Range Status   Specimen Description BLOOD ARM LEFT   Final   Special Requests BOTTLES DRAWN AEROBIC ONLY 5CC   Final   Culture  Setup Time     Final   Value: 02/15/2014 20:12     Performed at [facility]   Culture     Final   Value: NO GROWTH 5 DAYS     Performed at [facility]   Report Status 02/21/2014 FINAL   Final  GRAM STAIN     Status: None   Collection Time    02/19/14 12:17 PM      Result Value Ref Range Status   Specimen Description ABSCESS RIGHT LIVER   Final   Special Requests Normal   Final   Gram Stain     Final   Value: ABUNDANT WBC PRESENT,BOTH PMN AND MONONUCLEAR     NO ORGANISMS SEEN   Report Status 02/19/2014 FINAL   Final  CULTURE, ROUTINE-ABSCESS     Status: None   Collection Time    02/19/14 12:17 PM      Result Value Ref Range Status   Specimen Description ABSCESS RIGHT LIVER   Final   Special Requests NONE   Final   Gram Stain     Final   Value: ABUNDANT WBC PRESENT,BOTH PMN AND MONONUCLEAR     NO SQUAMOUS EPITHELIAL CELLS SEEN     NO ORGANISMS SEEN     Performed at [facility]     Performed at [facility]   Culture     Final   Value: NO GROWTH 3 DAYS     Performed at [facility]   Report Status 02/22/2014 FINAL   Final   URINE CULTURE     Status: None   Collection Time    02/20/14 12:45 AM      Result Value Ref Range Status   Specimen  Description URINE, CLEAN CATCH   Final   Special Requests NONE   Final   Culture  Setup Time     Final   Value: 02/20/2014 05:06     Performed at [facility]     Final   Value: NO GROWTH     Performed at [facility]   Culture     Final   Value: NO GROWTH     Performed at [facility]   Report Status 02/21/2014 FINAL   Final     Studies: US Renal  02/24/2014   CLINICAL DATA:  73 year old male with acute renal failure. Hypertension.  EXAM: RENAL/URINARY TRACT ULTRASOUND COMPLETE  COMPARISON:  02/13/2014 abdominal MR  FINDINGS: Right Kidney:  Length: 12.2 cm.  No hydronephrosis.  Cysts measure up to 2 cm.  Left Kidney:  Length: 13.6 cm.  No hydronephrosis.  Cysts measure up to 4.2 cm.  Bladder:  Enlarged prostate gland. Clinical and laboratory correlation recommended. No gross urinary bladder abnormality detected.  IMPRESSION: No hydronephrosis.  Bilateral renal cysts larger on the left.  Enlarged prostate gland. Clinical and laboratory correlation recommended   Electronically Signed   By: Chauncey Cruel M.D.   On: 02/24/2014 03:20    Scheduled Meds: . Influenza vac split quadrivalent PF  0.5 mL Intramuscular Tomorrow-1000  . piperacillin-tazobactam (ZOSYN)  IV  3.375 g Intravenous 3 times per day  . pneumococcal 23 valent vaccine  0.5 mL Intramuscular Tomorrow-1000  . terazosin  5 mg Oral Daily   Continuous Infusions: . sodium chloride 100 mL/hr at 02/24/14 1705   Antibiotics Given (last 72 hours)   Date/Time Action Medication Dose Rate   02/22/14 0033 Given   piperacillin-tazobactam (ZOSYN) IVPB 3.375 g 3.375 g 12.5 mL/hr   02/22/14 0530 Given   vancomycin (VANCOCIN) IVPB 750 mg/150 ml premix 750 mg 150 mL/hr   02/22/14 0805 Given   piperacillin-tazobactam (ZOSYN) IVPB 3.375 g 3.375 g 12.5 mL/hr   02/22/14 1700 Given    piperacillin-tazobactam (ZOSYN) IVPB 3.375 g 3.375 g 12.5 mL/hr   02/22/14 1834 Given   vancomycin (VANCOCIN) IVPB 750 mg/150 ml premix 750 mg 150 mL/hr   02/22/14 2346 Given   piperacillin-tazobactam (ZOSYN) IVPB 3.375 g 3.375 g 12.5 mL/hr   02/23/14 0618 Given   vancomycin (VANCOCIN) IVPB 750 mg/150 ml premix 750 mg 150 mL/hr   02/23/14 0957 Given   piperacillin-tazobactam (ZOSYN) IVPB 3.375 g 3.375 g 12.5 mL/hr   02/23/14 1602 Given   piperacillin-tazobactam (ZOSYN) IVPB 3.375 g 3.375 g 12.5 mL/hr   02/23/14 1721 Given   vancomycin (VANCOCIN) IVPB 750 mg/150 ml premix 750 mg 150 mL/hr   02/23/14 2340 Given   piperacillin-tazobactam (ZOSYN) IVPB 3.375 g 3.375 g 12.5 mL/hr   02/24/14 0526 Given   vancomycin (VANCOCIN) IVPB 750 mg/150 ml premix 750 mg 150 mL/hr   02/24/14 0834 Given   piperacillin-tazobactam (ZOSYN) IVPB 3.375 g 3.375 g 12.5 mL/hr   02/24/14 1627 Given   piperacillin-tazobactam (ZOSYN) IVPB 3.375 g 3.375 g 12.5 mL/hr      Principal Problem:   Essential hypertension Active Problems:   Liver abscess   BPH (benign prostatic hyperplasia)   Colonic mass    Time spent: 82min    Kayliah Tindol  [facility] Pager [phone]. If 7PM-7AM, please contact night-coverage at [email or web address], password [facility] 02/24/2014, 6:29 PM  LOS: 9 days

## 2014-02-24 NOTE — Clinical Social Work Placement (Addendum)
Clinical Social Work Department CLINICAL SOCIAL WORK PLACEMENT NOTE 02/24/2014  Patient:  Brent Little,Brent Little  Account Number:  [id number] Admit date:  02/15/2014  Clinical Social Worker:  Daiva Huge  Date/time:  02/24/2014 04:21 PM  Clinical Social Work is seeking post-discharge placement for this patient at the following level of care:   SKILLED NURSING   (*CSW will update this form in Epic as items are completed)   02/24/2014  Patient/family provided with [place] Department of Clinical Social Work's list of facilities offering this level of care within the geographic area requested by the patient (or if unable, by the patient's family).  02/24/2014  Patient/family informed of their freedom to choose among providers that offer the needed level of care, that participate in Medicare, Medicaid or managed care program needed by the patient, have an available bed and are willing to accept the patient.  02/24/2014  Patient/family informed of [facility]' ownership interest in [facility], as well as of the fact that they are under no obligation to receive care at this facility.  PASARR submitted to EDS on 02/25/14 Randall Hiss R. Kayen Grabel, MSW, [place])  PASARR number received on 03/06/14 Randall Hiss R. Zyanna Leisinger, MSW, [place])   FL2 transmitted to all facilities in geographic area requested by pt/family on  02/24/14 Randall Hiss R. Sayf Kerner, MSW, [place])  FL2 transmitted to all facilities within larger geographic area on 02/24/14 Randall Hiss R. Jazmin Ley, MSW, [place])   Patient informed that his/her managed care company has contracts with or will negotiate with  certain facilities, including the following:     Patient/family informed of bed offers received: 03/06/14 Randall Hiss R. Orlena Garmon, MSW, [place])  Patient chooses bed at [facility] R. Jony Ladnier, MSW, [place])  Physician recommends and patient chooses bed at    Patient to be transferred to  on  Mitzi Hansen on 03/09/14 Randall Hiss R. Jamirra Curnow,  MSW, [place])  Patient to be transferred to facility by [facility] Randall Hiss R. Elliot Meldrum, MSW, LCSWA)  Patient and family notified of transfer on 03/09/14 Jones Broom. Jerry Haugen, MSW, [place])  Name of family member notified: Jacqlyn Larsen wife Jones Broom. Lorie Cleckley, MSW, [place])   The following physician request were entered in Epic:   Additional Comments: Eduard Clos, MSW, [place]  Updated Jones Broom. [place], MSW, [employer] 03/09/2014 5:00 PM

## 2014-02-25 ENCOUNTER — Inpatient Hospital Stay (HOSPITAL_COMMUNITY): Payer: Medicare Other

## 2014-02-25 DIAGNOSIS — N179 Acute kidney failure, unspecified: Secondary | ICD-10-CM

## 2014-02-25 LAB — BASIC METABOLIC PANEL
Anion gap: 15 (ref 5–15)
BUN: 25 mg/dL — ABNORMAL HIGH (ref 6–23)
CHLORIDE: 106 meq/L (ref 96–112)
CO2: 19 mEq/L (ref 19–32)
Calcium: 8.6 mg/dL (ref 8.4–10.5)
Creatinine, Ser: 3.5 mg/dL — ABNORMAL HIGH (ref 0.50–1.35)
GFR, EST AFRICAN AMERICAN: 18 mL/min — AB (ref >90)
GFR, EST NON AFRICAN AMERICAN: 16 mL/min — AB (ref >90)
Glucose, Bld: 98 mg/dL (ref 70–99)
POTASSIUM: 3.9 meq/L (ref 3.7–5.3)
Sodium: 140 mEq/L (ref 137–147)

## 2014-02-25 LAB — URIC ACID: Uric Acid, Serum: 4.1 mg/dL (ref 4.0–7.8)

## 2014-02-25 LAB — CLOSTRIDIUM DIFFICILE BY PCR: CDIFFPCR: NEGATIVE

## 2014-02-25 LAB — CK: CK TOTAL: 22 U/L (ref 7–232)

## 2014-02-25 MED ORDER — IOHEXOL 300 MG/ML  SOLN
25.0000 mL | INTRAMUSCULAR | Status: AC
  Administered 2014-02-25 (×2): 25 mL via ORAL

## 2014-02-25 MED ORDER — ACETAMINOPHEN 325 MG PO TABS: 650.0000 mg | ORAL_TABLET | ORAL | Status: DC | PRN

## 2014-02-25 NOTE — Progress Notes (Signed)
HR down to 34, Dr. Karleen Hampshire notified. Kathleen Argue S 2:05 PM

## 2014-02-25 NOTE — Progress Notes (Signed)
[facility] PROGRESS NOTE  Brent [name] KGY:[id number] DOB: [birth year] DOA: 02/15/2014 PCP: [name], MD  Brief Narrative: Brent [name] is a 73 y.o. male, With H/O HTN, Kidney stone, who initially presented to [place] about one week ago with fever, dull right upper quadrant pain and a dry cough. He was diagnosed there with a possible community-acquired pneumonia, was placed on Levaquin, he had marginal improvement. During his fever workup he had a right upper quadrant ultrasound which showed some nonspecific changes . He was then discharged home with outpatient MRI, MRI done in the outpatient setting showed a septated cystic lesion in the right hepatic lobe concerning for abscess versus metastatic necrotic mass. MRI also showed an ascending colon circumferential mass. He presented to [facility] with fever, chills, decreased appetite and lethargy. Now being followed by [facility]/GI and IR. Has been on broad spectrum Abx, finally underwent Liver biopsy 10/12,. And a drain put it. His creatinine started creeping up on 10/16. Vancomycin trough came back elevated. Stopped vancomycin and renal consulted.    Assessment/Plan: 1. Liver abscess vs necrotic mets; - on zosyn day 10. Will stop the antibiotics after today's dose. Patient started vomiting this afternoon . We will get a CT abdomen and pelvis without contrast to further evaluate.  -s/p US guided biopsy 10/12; yielded purulent appearing material12F drain was placed under US guidance.-Culture-gram stain-no organisms seen, FU cultures -afebrile today -Blood CX-NGTD - IR on board.   2. Ascending colon mass -never had colonoscopy, needs this, patient and family wants it to be done at [place] . -CEA mildly elevated  3. HTN -stable, ACE on hold  4. Anemia -anemia panel c/w Iron deficiency  5. ? Dysuria yesterday -UA benign  6. Confusion/encephalopathy -Improving, possibly from the sedating pain meds.   7. Acute renal failure: secondary  to vancomycin. US RENAL is negative for hydronephrosis. UA is negative. Vancomycin troough is 47.3  measure Intake and output and gentle hydration. Repeat renal parameters later are rising. Renal consulted and recommendations given.   DVT proph: lovenox  Code Status: Full Code Family Communication: wife at bedside Disposition Plan: SNF when stable   Consultants:  GI  CCS  IR  HPI/Subjective: Comfortable, cheerful. Mental status clearing up.  Pain is better. One episode of vomiting.   Objective: Filed Vitals:   02/25/14 1447  BP: 135/54  Pulse: 53  Temp: 98.4 F (36.9 C)  Resp: 18    Intake/Output Summary (Last 24 hours) at 02/25/14 1718 Last data filed at 02/25/14 1102  Gross per 24 hour  Intake 306.67 ml  Output    220 ml  Net  86.67 ml   Filed Weights   02/23/14 0534 02/24/14 0529 02/25/14 0428  Weight: 75.2 kg (165 lb 12.6 oz) 78.2 kg (172 lb 6.4 oz) 78.336 kg (172 lb 11.2 oz)    Exam:   General:  alert and conversant.   Cardiovascular: S1S2/RRR  Respiratory: CTAB  Abdomen: soft, NO tenderness in RUQ, BS present, drain  Musculoskeletal: no edema c/c   Data Reviewed: Basic Metabolic Panel:  Recent Labs Lab 02/21/14 1100 02/23/14 0350 02/23/14 1929 02/24/14 0510 02/25/14 0425  NA 137 139 138 140 140  K 3.6* 3.9 3.8 4.0 3.9  CL 100 104 103 105 106  CO2 23 21 22 19 19   GLUCOSE 114* 93 118* 101* 98  BUN 16 18 21 23  25*  CREATININE 1.26 1.70* 2.24* 2.80* 3.50*  CALCIUM 8.9 8.9 8.9 8.9 8.6   Liver Function Tests:  Recent Labs Lab 02/19/14 0550 02/21/14 1100 02/23/14 1929  AST 30 53* 45*  ALT 19 33 39  ALKPHOS 164* 209* 233*  BILITOT 1.3* 1.1 0.8  PROT 6.0 6.4 6.4  ALBUMIN 2.0* 2.0* 2.1*   No results found for this basename: LIPASE, AMYLASE,  in the last 168 hours No results found for this basename: AMMONIA,  in the last 168 hours CBC:  Recent Labs Lab 02/19/14 0550 02/21/14 1100  WBC 14.0* 10.5  HGB 8.6* 8.9*  HCT 25.6*  26.6*  MCV 77.8* 78.2  PLT 395 383   Cardiac Enzymes:  Recent Labs Lab 02/25/14 1537  CKTOTAL 22   BNP (last 3 results) No results found for this basename: PROBNP,  in the last 8760 hours CBG: No results found for this basename: GLUCAP,  in the last 168 hours  Recent Results (from the past 240 hour(s))  GRAM STAIN     Status: None   Collection Time    02/19/14 12:17 PM      Result Value Ref Range Status   Specimen Description ABSCESS RIGHT LIVER   Final   Special Requests Normal   Final   Gram Stain     Final   Value: ABUNDANT WBC PRESENT,BOTH PMN AND MONONUCLEAR     NO ORGANISMS SEEN   Report Status 02/19/2014 FINAL   Final  CULTURE, ROUTINE-ABSCESS     Status: None   Collection Time    02/19/14 12:17 PM      Result Value Ref Range Status   Specimen Description ABSCESS RIGHT LIVER   Final   Special Requests NONE   Final   Gram Stain     Final   Value: ABUNDANT WBC PRESENT,BOTH PMN AND MONONUCLEAR     NO SQUAMOUS EPITHELIAL CELLS SEEN     NO ORGANISMS SEEN     Performed at [facility]     Performed at [facility]   Culture     Final   Value: NO GROWTH 3 DAYS     Performed at [facility]   Report Status 02/22/2014 FINAL   Final  URINE CULTURE     Status: None   Collection Time    02/20/14 12:45 AM      Result Value Ref Range Status   Specimen Description URINE, CLEAN CATCH   Final   Special Requests NONE   Final   Culture  Setup Time     Final   Value: 02/20/2014 05:06     Performed at [facility]     Final   Value: NO GROWTH     Performed at [facility]   Culture     Final   Value: NO GROWTH     Performed at [facility]   Report Status 02/21/2014 FINAL   Final     Studies: US Renal  02/24/2014   CLINICAL DATA:  73 year old male with acute renal failure. Hypertension.  EXAM: RENAL/URINARY TRACT ULTRASOUND COMPLETE  COMPARISON:  02/13/2014 abdominal MR  FINDINGS: Right Kidney:  Length:  12.2 cm.  No hydronephrosis.  Cysts measure up to 2 cm.  Left Kidney:  Length: 13.6 cm.  No hydronephrosis.  Cysts measure up to 4.2 cm.  Bladder:  Enlarged prostate gland. Clinical and laboratory correlation recommended. No gross urinary bladder abnormality detected.  IMPRESSION: No hydronephrosis.  Bilateral renal cysts larger on the left.  Enlarged prostate gland. Clinical and laboratory correlation recommended  Electronically Signed   By: Chauncey Cruel M.D.   On: 02/24/2014 03:20    Scheduled Meds: . Influenza vac split quadrivalent PF  0.5 mL Intramuscular Tomorrow-1000  . piperacillin-tazobactam (ZOSYN)  IV  3.375 g Intravenous 3 times per day  . pneumococcal 23 valent vaccine  0.5 mL Intramuscular Tomorrow-1000  . terazosin  5 mg Oral Daily   Continuous Infusions: . sodium chloride 100 mL/hr at 02/25/14 0843   Antibiotics Given (last 72 hours)   Date/Time Action Medication Dose Rate   02/22/14 1834 Given   vancomycin (VANCOCIN) IVPB 750 mg/150 ml premix 750 mg 150 mL/hr   02/22/14 2346 Given   piperacillin-tazobactam (ZOSYN) IVPB 3.375 g 3.375 g 12.5 mL/hr   02/23/14 0618 Given   vancomycin (VANCOCIN) IVPB 750 mg/150 ml premix 750 mg 150 mL/hr   02/23/14 0957 Given   piperacillin-tazobactam (ZOSYN) IVPB 3.375 g 3.375 g 12.5 mL/hr   02/23/14 1602 Given   piperacillin-tazobactam (ZOSYN) IVPB 3.375 g 3.375 g 12.5 mL/hr   02/23/14 1721 Given   vancomycin (VANCOCIN) IVPB 750 mg/150 ml premix 750 mg 150 mL/hr   02/23/14 2340 Given   piperacillin-tazobactam (ZOSYN) IVPB 3.375 g 3.375 g 12.5 mL/hr   02/24/14 0526 Given   vancomycin (VANCOCIN) IVPB 750 mg/150 ml premix 750 mg 150 mL/hr   02/24/14 0834 Given   piperacillin-tazobactam (ZOSYN) IVPB 3.375 g 3.375 g 12.5 mL/hr   02/24/14 1627 Given   piperacillin-tazobactam (ZOSYN) IVPB 3.375 g 3.375 g 12.5 mL/hr   02/25/14 0000 Given   piperacillin-tazobactam (ZOSYN) IVPB 3.375 g 3.375 g 12.5 mL/hr   02/25/14 0840 Given    piperacillin-tazobactam (ZOSYN) IVPB 3.375 g 3.375 g 12.5 mL/hr   02/25/14 1542 Given   piperacillin-tazobactam (ZOSYN) IVPB 3.375 g 3.375 g 12.5 mL/hr      Principal Problem:   Essential hypertension Active Problems:   Liver abscess   BPH (benign prostatic hyperplasia)   Colonic mass   Acute renal failure    Time spent: 74min    Reuel Lamadrid  [facility] Pager [phone]. If 7PM-7AM, please contact night-coverage at [email or web address], password [facility] 02/25/2014, 5:18 PM  LOS: 10 days

## 2014-02-25 NOTE — Consult Note (Signed)
Reason for Consult:AKI Referring Physician: [name] is an 73 y.o. male.  HPI: Admitted with sepsis from possible CAP. MRI showed cystic lesion in liver, concerning for abscess, placed on vanc/zosyn. New AKI on 10/16 with creat trending up since and vanc trough 47 on 10/17. Pt is [name] 73yo WM with PMH sig for HTN, CKD stage 3 (with baseline Scr 1.24), and colonic mass worrisome for malignancy who presented to an OSH with h/o N/V/D, poor po intake and fevers.  Work up included abdominal US which showed possible cysts or abscesses of liver and MRI revealed possible abscesses as well as [name] circumferential ascending colonic mass.  He presented to [facility] for further evaluation and management of persistent fevers, N/V/D/anorexia and FTT.  Of note, he had been taking ramipril and alleve prior to admission.  After admission he was started on IV Vanc and zosyn.  His Scr started to climb 6 days into his admission and the trend in Scr is seen below.  His random vanco level was 22.3 on 02/18/14 and his dose was decreased to 750mg  bid but unfortunately was not rechecked until 02/23/14 and was 47.3.  We were asked to see the patient to help evaluate and manage his AKI/CKD.  He was also given [name] dose of toradol on 02/21/14.  His Vancomycin has been held, however his Scr continues to climb. Trend in Creatinine: Creatinine, Ser  Date/Time Value Ref Range Status  02/25/2014  4:25 AM 3.50* 0.50 - 1.35 mg/dL Final  02/24/2014  5:10 AM 2.80* 0.50 - 1.35 mg/dL Final  02/23/2014  7:29 PM 2.24* 0.50 - 1.35 mg/dL Final  02/23/2014  3:50 AM 1.70* 0.50 - 1.35 mg/dL Final  02/21/2014 11:00 AM 1.26  0.50 - 1.35 mg/dL Final  02/19/2014  5:50 AM 1.32  0.50 - 1.35 mg/dL Final  02/17/2014 12:26 AM 1.31  0.50 - 1.35 mg/dL Final  02/16/2014  4:15 AM 1.21  0.50 - 1.35 mg/dL Final  02/15/2014 12:43 PM 1.09  0.50 - 1.35 mg/dL Final    PMH:   Past Medical History  Diagnosis Date  . Hypertension   . Acute renal failure 02/24/2014     PSH:   Past Surgical History  Procedure Laterality Date  . Tonsillectomy      Allergies: No Known Allergies  Medications:   Prior to Admission medications   Medication Sig Start Date End Date Taking? Authorizing Provider  hydrochlorothiazide (HYDRODIURIL) 25 MG tablet Take 25 mg by mouth daily.   Yes Historical Provider, Brent [name]  potassium chloride SA (K-DUR,KLOR-CON) 20 MEQ tablet Take 20 mEq by mouth daily.  02/09/14  Yes Historical Provider, Brent [name]  ramipril (ALTACE) 10 MG capsule Take 10 mg by mouth daily.  01/29/14  Yes Historical Provider, Brent [name]  terazosin (HYTRIN) 5 MG capsule Take 5 mg by mouth daily.  01/29/14  Yes Historical Provider, Brent [name]    Inpatient medications: . Influenza vac split quadrivalent PF  0.5 mL Intramuscular Tomorrow-1000  . piperacillin-tazobactam (ZOSYN)  IV  3.375 g Intravenous 3 times per day  . pneumococcal 23 valent vaccine  0.5 mL Intramuscular Tomorrow-1000  . terazosin  5 mg Oral Daily    Discontinued Meds:   Medications Discontinued During This Encounter  Medication Reason  . morphine 2 MG/ML injection 2 mg   . heparin injection 5,000 Units   . morphine 2 MG/ML injection 2 mg   . vancomycin (VANCOCIN) IVPB 1000 mg/200 mL premix Dose change  . gelatin adsorbable (GELFOAM/SURGIFOAM) 12-7 MM sponge  12-7 mm Returned to ADS  . morphine 2 MG/ML injection 1 mg   . HYDROcodone-acetaminophen (NORCO/VICODIN) 5-325 MG per tablet 1-2 tablet   . ketorolac (TORADOL) tablet 10 mg   . morphine 2 MG/ML injection 2 mg   . 0.9 %  sodium chloride infusion   . vancomycin (VANCOCIN) IVPB 750 mg/150 ml premix   . traMADol (ULTRAM) tablet 50 mg     Social History:  reports that he has quit smoking. He does not have any smokeless tobacco history on file. He reports that he does not drink alcohol. His drug history is not on file.  Family History:  History reviewed. No pertinent family history.  [name] comprehensive review of systems was negative except for: Gastrointestinal:  positive for diarrhea, nausea and vomiting Weight change: 4.8 oz (0.136 kg)  Intake/Output Summary (Last 24 hours) at 02/25/14 1316 Last data filed at 02/25/14 1102  Gross per 24 hour  Intake 306.67 ml  Output    220 ml  Net  86.67 ml   BP 133/61  Pulse 59  Temp(Src) 99 F (37.2 C) (Oral)  Resp 18  Ht 6' (1.829 m)  Wt 172 lb 11.2 oz (78.336 kg)  BMI 23.42 kg/m2  SpO2 97% Filed Vitals:   02/24/14 1433 02/24/14 2130 02/25/14 0428 02/25/14 1011  BP: 129/57 143/85 138/62 133/61  Pulse: 64 60 59   Temp: 98.6 F (37 C) 97.9 F (36.6 C) 99 F (37.2 C)   TempSrc: Oral Oral Oral   Resp: 18 18 18    Height:      Weight:   172 lb 11.2 oz (78.336 kg)   SpO2: 97% 98% 97%      General appearance: cooperative, no distress and slowed mentation Resp: clear to auscultation bilaterally Cardio: regular rate and rhythm, S1, S2 normal, no murmur, click, rub or gallop GI: soft, non-tender; bowel sounds normal; no masses,  no organomegaly Extremities: extremities normal, atraumatic, no cyanosis or edema Pulses: 2+ and symmetric Neurologic: Grossly normal  Labs: Basic Metabolic Panel:  Recent Labs Lab 02/19/14 0550 02/21/14 1100 02/23/14 0350 02/23/14 1929 02/24/14 0510 02/25/14 0425  NA 138 137 139 138 140 140  K 3.5* 3.6* 3.9 3.8 4.0 3.9  CL 99 100 104 103 105 106  CO2 26 23 21 22 19 19   GLUCOSE 105* 114* 93 118* 101* 98  BUN 15 16 18 21 23  25*  CREATININE 1.32 1.26 1.70* 2.24* 2.80* 3.50*  ALBUMIN 2.0* 2.0*  --  2.1*  --   --   CALCIUM 8.6 8.9 8.9 8.9 8.9 8.6   Liver Function Tests:  Recent Labs Lab 02/19/14 0550 02/21/14 1100 02/23/14 1929  AST 30 53* 45*  ALT 19 33 39  ALKPHOS 164* 209* 233*  BILITOT 1.3* 1.1 0.8  PROT 6.0 6.4 6.4  ALBUMIN 2.0* 2.0* 2.1*   No results found for this basename: LIPASE, AMYLASE,  in the last 168 hours No results found for this basename: AMMONIA,  in the last 168 hours CBC:  Recent Labs Lab 02/19/14 0550 02/21/14 1100  WBC  14.0* 10.5  HGB 8.6* 8.9*  HCT 25.6* 26.6*  MCV 77.8* 78.2  PLT 395 383   PT/INR: @LABRCNTIP (INR:5)@ Cardiac Enzymes: )No results found for this basename: CKTOTAL, CKMB, CKMBINDEX, TROPONINI,  in the last 168 hours CBG: No results found for this basename: GLUCAP,  in the last 168 hours  Iron Studies: No results found for this basename: IRON, TIBC, TRANSFERRIN, FERRITIN,  in the last  168 hours  Xrays/Other Studies: US Renal  02/24/2014   CLINICAL DATA:  73 year old male with acute renal failure. Hypertension.  EXAM: RENAL/URINARY TRACT ULTRASOUND COMPLETE  COMPARISON:  02/13/2014 abdominal MR  FINDINGS: Right Kidney:  Length: 12.2 cm.  No hydronephrosis.  Cysts measure up to 2 cm.  Left Kidney:  Length: 13.6 cm.  No hydronephrosis.  Cysts measure up to 4.2 cm.  Bladder:  Enlarged prostate gland. Clinical and laboratory correlation recommended. No gross urinary bladder abnormality detected.  IMPRESSION: No hydronephrosis.  Bilateral renal cysts larger on the left.  Enlarged prostate gland. Clinical and laboratory correlation recommended   Electronically Signed   By: Chauncey Cruel M.D.   On: 02/24/2014 03:20    Assessment/Plan: 1. AKI: Bilateral renal cysts on ultrasound, UA wnl. FENa 1.2%. Holding vanc and ACE 2. Liver abscess vs. Necrotic mets: vanc/zosyn, drained 10/12, CT when output <65mL 3. Colon mass - needs colonoscopy outpatient 4. HTN - holding ACE, stable 5. Anemia - iron deficiency 6. Encephalopathy - possibly medication induced, improved per family 7. N/V/diarrhea - cdif ordered, unclear etiology, afebrile   Brent [name] 02/25/2014, 1:16 PM   I have seen and examined this patient and agree with plan as outlined by Brent [name] with the above additions. 1. AKI/CKD in setting of volume depletion/hypotension/Ace-inhibition/NSAIDs/vanc toxicity. Continue to hold vanco and renal dose medication.  1. Will check SPEP/UPEP, FeNa, Urine eosinophils to r/o AIN related to  Zosyn 2. Cont with IVF's given poor po intake. 3. Replete K very carefully 2. Liver abscess vs necrotic mets- will need tissue diagnosis to help guide therapy as well as prognosis. 1. Await CT scan of Abd 3. Anemia- normocytic-?GIB vs due to malignancy 1. Check iron stores, SPEP/UPEP, and will need colonoscopy 4. HTN- stable, cont to hold ACE 5. Encephalopathy- 6. N/V/D/protein malnutrition- GI following 7. Colon mass- as above 8. Severe protein malnutrition  Brent Fukuhara [name],Brent [name] 02/25/2014 2:33 PM

## 2014-02-26 LAB — BASIC METABOLIC PANEL
Anion gap: 17 — ABNORMAL HIGH (ref 5–15)
BUN: 28 mg/dL — AB (ref 6–23)
CHLORIDE: 105 meq/L (ref 96–112)
CO2: 17 mEq/L — ABNORMAL LOW (ref 19–32)
Calcium: 8.3 mg/dL — ABNORMAL LOW (ref 8.4–10.5)
Creatinine, Ser: 3.92 mg/dL — ABNORMAL HIGH (ref 0.50–1.35)
GFR, EST AFRICAN AMERICAN: 16 mL/min — AB (ref >90)
GFR, EST NON AFRICAN AMERICAN: 14 mL/min — AB (ref >90)
Glucose, Bld: 105 mg/dL — ABNORMAL HIGH (ref 70–99)
Potassium: 4 mEq/L (ref 3.7–5.3)
Sodium: 139 mEq/L (ref 137–147)

## 2014-02-26 MED ORDER — PIPERACILLIN-TAZOBACTAM IN DEX 2-0.25 GM/50ML IV SOLN
2.2500 g | Freq: Three times a day (TID) | INTRAVENOUS | Status: DC
  Administered 2014-02-26: 2.25 g via INTRAVENOUS
  Filled 2014-02-26 (×3): qty 50

## 2014-02-26 NOTE — Progress Notes (Signed)
ANTIBIOTIC CONSULT NOTE - FOLLOW UP  Pharmacy Consult for zosyn Indication: Liver abscess  No Known Allergies  Patient Measurements: Height: 6' (182.9 cm) Weight: 171 lb 11.8 oz (77.9 kg) IBW/kg (Calculated) : 77.6  Vital Signs: Temp: 98.4 F (36.9 C) (10/19 1300) Temp Source: Oral (10/19 1300) BP: 129/53 mmHg (10/19 1300) Pulse Rate: 50 (10/19 1300) Intake/Output from previous day: 10/18 0701 - 10/19 0700 In: 1061 [I.V.:1061] Out: 260 [Urine:250; Drains:10] Intake/Output from this shift:     Assessment: 73 YOM on day #11 zosyn for Liver abscess without necrotic mets. Bx 10/12 in IR, JP drain place, purulent material, but cx neg so far. Now afb. WBC down wnl, per MD note, will stop zosyn after today's dose, renal function worse, crcl ~ 20 ml/min.   Zosyn 10/8>>  10/8: BC x 2 - neg 10/12 - right liver abscess - no org seen, ng x 2 days so far 10/13 - urine - neg 10/18: c-diff neg  Plan:  - Adjust zosyn to 2.25g IV Q 8 hrs - f/u zosyn stop date tomorrow.  Maryanna Shape, PharmD, BCPS  Clinical Pharmacist  Pager: [phone]   02/26/2014,3:21 PM

## 2014-02-26 NOTE — Progress Notes (Signed)
Physical Therapy Treatment Patient Details Name: Brent Little MRN: [id number] DOB: 1941-03-10 Today's Date: 02/26/2014    History of Present Illness Pt admitted with fever chills, decreased appetite and lethargy.  Liver mass noted on CT and diagnosed as an abcess after biopsy.    PT Comments    Pt admitted with above. Pt currently with functional limitations due to balance and endurance deficits.Improving daily but still needs short term NHP to maximize function.    Pt will benefit from skilled PT to increase their independence and safety with mobility to allow discharge to the venue listed below.   Follow Up Recommendations  Supervision/Assistance - 24 hour;SNF     Equipment Recommendations  None recommended by PT    Recommendations for Other Services       Precautions / Restrictions Precautions Precautions: Fall Restrictions Weight Bearing Restrictions: No    Mobility  Bed Mobility Overal bed mobility: Needs Assistance Bed Mobility: Supine to Sit     Supine to sit: Supervision;HOB elevated     General bed mobility comments: no physical assist needed  Transfers Overall transfer level: Needs assistance Equipment used: Rolling walker (2 wheeled) Transfers: Sit to/from Stand Sit to Stand: Supervision            Ambulation/Gait Ambulation/Gait assistance: Min guard Ambulation Distance (Feet): 350 Feet Assistive device: Rolling walker (2 wheeled) Gait Pattern/deviations: Leaning posteriorly;Narrow base of support;Decreased stride length Gait velocity: decreased Gait velocity interpretation: Below normal speed for age/gender General Gait Details: Gait steady.  No LOB with min challenges to balance.  Slower gait than normal but improving.     Stairs            Wheelchair Mobility    Modified Rankin (Stroke Patients Only)       Balance Overall balance assessment: Needs assistance Sitting-balance support: No upper extremity supported;Feet  supported Sitting balance-Leahy Scale: Good     Standing balance support: Bilateral upper extremity supported;During functional activity Standing balance-Leahy Scale: Fair Standing balance comment: can stand statically without UE support.                      Cognition Arousal/Alertness: Awake/alert   Overall Cognitive Status: Within Functional Limits for tasks assessed     Current Attention Level: Selective Memory: Decreased short-term memory;Decreased recall of precautions Following Commands: Follows multi-step commands consistently   Awareness: Intellectual   General Comments: Significant improvement in pt's level of arousal, appropriateness of conversation, and ability to communicate his needs.    Exercises      General Comments        Pertinent Vitals/Pain Pain Assessment: No/denies pain VSS    Home Living                      Prior Function            PT Goals (current goals can now be found in the care plan section) Progress towards PT goals: Progressing toward goals    Frequency  Min 3X/week    PT Plan Current plan remains appropriate    Co-evaluation             End of Session Equipment Utilized During Treatment: Gait belt Activity Tolerance: Patient tolerated treatment well Patient left: in chair;with family/visitor present;with nursing/sitter in room;with call bell/phone within reach     Time: 9485-4627 PT Time Calculation (min): 30 min  Charges:  $Gait Training: 23-37 mins  G CodesDenice Paradise 02/26/2014, 4:23 PM [employer] [facility] [phone] (pager)

## 2014-02-26 NOTE — Progress Notes (Signed)
Please see Resident Note for other details. He appears clinically on the dry side and Little/o thirst but denies dizziness.  Will check orthostatics and cont IVF replacement.  Recovery may be a little slow due to the aged kidney, but anticipate recovery.  Brent Little

## 2014-02-26 NOTE — Progress Notes (Signed)
Agree with above note.  Hepatic abscess can persist with early drain discontinuation.  Would prefer that patient is afebrile, cultures finalized, and with drainage output <10cc/day before DC.

## 2014-02-26 NOTE — Consult Note (Signed)
S: Still not feeling well, nausea, no appetite but very thirsty O:BP 127/51  Pulse 61  Temp(Src) 99.1 F (37.3 C) (Oral)  Resp 18  Ht 6' (1.829 m)  Wt 171 lb 11.8 oz (77.9 kg)  BMI 23.29 kg/m2  SpO2 99%  Intake/Output Summary (Last 24 hours) at 02/26/14 3016 Last data filed at 02/26/14 0600  Gross per 24 hour  Intake   1061 ml  Output    260 ml  Net    801 ml   Intake/Output: I/O last 3 completed shifts: In: 1362.7 [I.V.:1352.7; Other:10] Out: 425 [Urine:400; Drains:25]  Intake/Output this shift:    Weight change: -15.4 oz (-0.436 kg) Gen:NAD, thin elderly gentleman, lying in bed CVS:RRR, no MRG Resp:CTAB WFU:XNAT, NTND, +BS Ext:WWP, no LE edema   Recent Labs Lab 02/21/14 1100 02/23/14 0350 02/23/14 1929 02/24/14 0510 02/25/14 0425  NA 137 139 138 140 140  K 3.6* 3.9 3.8 4.0 3.9  CL 100 104 103 105 106  CO2 23 21 22 19 19   GLUCOSE 114* 93 118* 101* 98  BUN 16 18 21 23  25*  CREATININE 1.26 1.70* 2.24* 2.80* 3.50*  ALBUMIN 2.0*  --  2.1*  --   --   CALCIUM 8.9 8.9 8.9 8.9 8.6  AST 53*  --  45*  --   --   ALT 33  --  39  --   --    Liver Function Tests:  Recent Labs Lab 02/21/14 1100 02/23/14 1929  AST 53* 45*  ALT 33 39  ALKPHOS 209* 233*  BILITOT 1.1 0.8  PROT 6.4 6.4  ALBUMIN 2.0* 2.1*   No results found for this basename: LIPASE, AMYLASE,  in the last 168 hours No results found for this basename: AMMONIA,  in the last 168 hours CBC:  Recent Labs Lab 02/21/14 1100  WBC 10.5  HGB 8.9*  HCT 26.6*  MCV 78.2  PLT 383   Cardiac Enzymes:  Recent Labs Lab 02/25/14 1537  CKTOTAL 22   CBG: No results found for this basename: GLUCAP,  in the last 168 hours  Iron Studies: No results found for this basename: IRON, TIBC, TRANSFERRIN, FERRITIN,  in the last 72 hours Studies/Results: Ct Abdomen Pelvis Wo Contrast  02/25/2014   CLINICAL DATA:  73 year old male admission with liver abscess.  EXAM: CT ABDOMEN AND PELVIS WITHOUT CONTRAST   TECHNIQUE: Multidetector CT imaging of the abdomen and pelvis was performed following the standard protocol without IV contrast.  COMPARISON:  Abdominal ultrasound 02/19/2014, MRI liver protocol 02/13/2014  FINDINGS: Lower chest:  Unremarkable appearance of the subcutaneous tissues of the right chest wall.  Heart size within normal limits. No pericardial fluid/ thickening. Calcifications in the left main, left anterior descending, circumflex, right coronary arteries.  Unremarkable appearance the distal esophagus.  Moderate right pleural effusion.  Atelectasis of the bilateral lung bases. Respiratory motion somewhat limits evaluation of the lungs for small nodules.  Abdomen/ pelvis:  Postsurgical changes of percutaneous drain placement into segment 6/right liver lobe abscess. Pigtail remains positioned within low-density region in the area of the prior abscess. Focal hypodensity in this region measures approximately 5.0- 5.5 cm. The overall size does appear smaller than the prior MRI, though comparison across modalities is somewhat limited by the tissue resolution on the CT.  Remainder of the liver parenchyma unremarkable.  Unremarkable appearance of the spleen. Unremarkable appearance of bilateral adrenal glands.  No evidence of hydronephrosis. Bilateral low-density cystic lesions of the kidneys, characterized on prior  MRI to represent benign cysts.  Enteric contrast extends through the distal small bowel and colon. No evidence of abnormally distended small bowel or colon.  Circumferential soft tissue within the ascending colon with "apple core" configuration, as was described on prior MRI.  There are a few small mesenteric lymph nodes, none of which are enlarged by CT size criteria. Specifically, ileocolic lymph node measures 7 mm with rounded configuration on image 35 of series 2.  Unremarkable appearance of the urinary bladder. Transverse diameter of the prostate measures 5.4 cm.  Small amount of low-density free  fluid layered within the rectovesical space.  Atherosclerotic calcifications of the abdominal aorta extending into the iliofemoral system.  Multilevel degenerative disc disease of the lower thoracic and lumbar spine. No bony canal stenosis. No acute displaced fracture.  IMPRESSION: Interval placement of percutaneous pigtail drainage collapse there into segment 6 liver fluid collection compared to prior MRI. Across modalities, there does appear to be decreased size of this collection, though there is likely some persisting fluid/ edema.  Re- demonstration of circumferential soft tissue within the ascending colon, concerning for colon carcinoma. There is a suspicious, rounded lymph node within the ileocolic nodal station, though is not enlarged by CT size criteria.  Atherosclerosis with evidence of left main in 3 vessel coronary artery disease.  Moderate right-sided pleural effusion with atelectasis.  Signed,  Dulcy Fanny. Earleen Newport, DO  [facility]  [facility]   Electronically Signed   By: Corrie Mckusick D.O.   On: 02/25/2014 19:20   . Influenza vac split quadrivalent PF  0.5 mL Intramuscular Tomorrow-1000  . piperacillin-tazobactam (ZOSYN)  IV  3.375 g Intravenous 3 times per day  . pneumococcal 23 valent vaccine  0.5 mL Intramuscular Tomorrow-1000  . terazosin  5 mg Oral Daily    Assessment/Plan:  1. AKI/CKD in setting of volume depletion/hypotension/Ace-inhibition/NSAIDs/vanc toxicity. Continue to hold vanco and renal dose medication.  1. ordered SPEP/UPEP, Urine eosinophils to r/o AIN related to Zosyn 2. Cont with IVF's given poor po intake and volume down 3. Replete K very carefully 2. Liver abscess vs necrotic mets- will need tissue diagnosis to help guide therapy as well as prognosis.  1. CT showed decreased size of hepatic fluid collection 3. Anemia- normocytic-?GIB vs due to malignancy  1. Checking iron stores, SPEP/UPEP, and will need  colonoscopy 4. HTN- stable, cont to hold ACE 5. Encephalopathy-improved 6. N/V/D/protein malnutrition- GI following, cdif negative 7. Colon mass- as above 8. Severe protein malnutrition   [name], Elena Jeet Shough C

## 2014-02-26 NOTE — Progress Notes (Signed)
[facility] PROGRESS NOTE  Brent Little JKD:[id number] DOB: March 03, 1941 DOA: 02/15/2014 PCP: [name], MD  Brief Narrative: Brent Little is a 73 y.o. male, With H/O HTN, Kidney stone, who initially presented to [place] about one week ago with fever, dull right upper quadrant pain and a dry cough. He was diagnosed there with a possible community-acquired pneumonia, was placed on Levaquin, he had marginal improvement. During his fever workup he had a right upper quadrant ultrasound which showed some nonspecific changes . He was then discharged home with outpatient MRI, MRI done in the outpatient setting showed a septated cystic lesion in the right hepatic lobe concerning for abscess versus metastatic necrotic mass. MRI also showed an ascending colon circumferential mass. He presented to [facility] with fever, chills, decreased appetite and lethargy. Now being followed by [facility]/GI and IR. Has been on broad spectrum Abx, finally underwent Liver biopsy 10/12,. And a drain put it. His creatinine started creeping up on 10/16. Vancomycin trough came back elevated. Stopped vancomycin and renal consulted.    Assessment/Plan: 1. Liver abscess vs necrotic mets; - on zosyn day 10. Will stop the antibiotics after today's dose..  A repeat CT abdomen and pelvis without contrast to further evaluate.  -s/p US guided biopsy 10/12; yielded purulent appearing material12F drain was placed under US guidance.-Culture-gram stain-no organisms seen, FU cultures negative.  -afebrile today -Blood CX-NGTD - IR on board.   2. Ascending colon mass -never had colonoscopy, needs this, patient and family wants it to be done at [place] . -CEA mildly elevated  3. HTN -stable, ACE on hold  4. Anemia -anemia panel c/w Iron deficiency  5. ? Dysuria -UA benign  6. Confusion/encephalopathy -Improving, possibly from the sedating pain meds.   7. Acute renal failure: secondary to vancomycin. US RENAL is negative for  hydronephrosis. UA is negative. Vancomycin troough is 47.3  measure Intake and output and gentle hydration. Repeat renal parameters later are rising. Renal consulted and recommendations given.   DVT proph: lovenox  Code Status: Full Code Family Communication: wife at bedside Disposition Plan: SNF when stable   Consultants:  GI  CCS  IR  HPI/Subjective: Comfortable, cheerful. Mental status clearing up.  Pain is better. One episode of vomiting.   Objective: Filed Vitals:   02/26/14 1300  BP: 129/53  Pulse: 50  Temp: 98.4 F (36.9 C)  Resp: 18    Intake/Output Summary (Last 24 hours) at 02/26/14 1849 Last data filed at 02/26/14 1500  Gross per 24 hour  Intake      0 ml  Output    310 ml  Net   -310 ml   Filed Weights   02/24/14 0529 02/25/14 0428 02/26/14 0500  Weight: 78.2 kg (172 lb 6.4 oz) 78.336 kg (172 lb 11.2 oz) 77.9 kg (171 lb 11.8 oz)    Exam:   General:  alert and conversant.   Cardiovascular: S1S2/RRR  Respiratory: CTAB  Abdomen: soft, NO tenderness in RUQ, BS present, drain  Musculoskeletal: no edema c/c   Data Reviewed: Basic Metabolic Panel:  Recent Labs Lab 02/23/14 0350 02/23/14 1929 02/24/14 0510 02/25/14 0425 02/26/14 0920  NA 139 138 140 140 139  K 3.9 3.8 4.0 3.9 4.0  CL 104 103 105 106 105  CO2 21 22 19 19  17*  GLUCOSE 93 118* 101* 98 105*  BUN 18 21 23  25* 28*  CREATININE 1.70* 2.24* 2.80* 3.50* 3.92*  CALCIUM 8.9 8.9 8.9 8.6 8.3*   Liver Function Tests:  Recent Labs Lab 02/21/14 1100 02/23/14 1929  AST 53* 45*  ALT 33 39  ALKPHOS 209* 233*  BILITOT 1.1 0.8  PROT 6.4 6.4  ALBUMIN 2.0* 2.1*   No results found for this basename: LIPASE, AMYLASE,  in the last 168 hours No results found for this basename: AMMONIA,  in the last 168 hours CBC:  Recent Labs Lab 02/21/14 1100  WBC 10.5  HGB 8.9*  HCT 26.6*  MCV 78.2  PLT 383   Cardiac Enzymes:  Recent Labs Lab 02/25/14 1537  CKTOTAL 22   BNP (last 3  results) No results found for this basename: PROBNP,  in the last 8760 hours CBG: No results found for this basename: GLUCAP,  in the last 168 hours  Recent Results (from the past 240 hour(s))  GRAM STAIN     Status: None   Collection Time    02/19/14 12:17 PM      Result Value Ref Range Status   Specimen Description ABSCESS RIGHT LIVER   Final   Special Requests Normal   Final   Gram Stain     Final   Value: ABUNDANT WBC PRESENT,BOTH PMN AND MONONUCLEAR     NO ORGANISMS SEEN   Report Status 02/19/2014 FINAL   Final  CULTURE, ROUTINE-ABSCESS     Status: None   Collection Time    02/19/14 12:17 PM      Result Value Ref Range Status   Specimen Description ABSCESS RIGHT LIVER   Final   Special Requests NONE   Final   Gram Stain     Final   Value: ABUNDANT WBC PRESENT,BOTH PMN AND MONONUCLEAR     NO SQUAMOUS EPITHELIAL CELLS SEEN     NO ORGANISMS SEEN     Performed at [facility]     Performed at [facility]   Culture     Final   Value: NO GROWTH 3 DAYS     Performed at [facility]   Report Status 02/22/2014 FINAL   Final  URINE CULTURE     Status: None   Collection Time    02/20/14 12:45 AM      Result Value Ref Range Status   Specimen Description URINE, CLEAN CATCH   Final   Special Requests NONE   Final   Culture  Setup Time     Final   Value: 02/20/2014 05:06     Performed at [facility]     Final   Value: NO GROWTH     Performed at [facility]   Culture     Final   Value: NO GROWTH     Performed at [facility]   Report Status 02/21/2014 FINAL   Final  CLOSTRIDIUM DIFFICILE BY PCR     Status: None   Collection Time    02/25/14  2:17 PM      Result Value Ref Range Status   C difficile by pcr NEGATIVE  NEGATIVE Final     Studies: Ct Abdomen Pelvis Wo Contrast  02/25/2014   CLINICAL DATA:  73 year old male admission with liver abscess.  EXAM: CT ABDOMEN AND PELVIS WITHOUT CONTRAST  TECHNIQUE:  Multidetector CT imaging of the abdomen and pelvis was performed following the standard protocol without IV contrast.  COMPARISON:  Abdominal ultrasound 02/19/2014, MRI liver protocol 02/13/2014  FINDINGS: Lower chest:  Unremarkable appearance of the subcutaneous tissues of the right chest wall.  Heart size within normal  limits. No pericardial fluid/ thickening. Calcifications in the left main, left anterior descending, circumflex, right coronary arteries.  Unremarkable appearance the distal esophagus.  Moderate right pleural effusion.  Atelectasis of the bilateral lung bases. Respiratory motion somewhat limits evaluation of the lungs for small nodules.  Abdomen/ pelvis:  Postsurgical changes of percutaneous drain placement into segment 6/right liver lobe abscess. Pigtail remains positioned within low-density region in the area of the prior abscess. Focal hypodensity in this region measures approximately 5.0- 5.5 cm. The overall size does appear smaller than the prior MRI, though comparison across modalities is somewhat limited by the tissue resolution on the CT.  Remainder of the liver parenchyma unremarkable.  Unremarkable appearance of the spleen. Unremarkable appearance of bilateral adrenal glands.  No evidence of hydronephrosis. Bilateral low-density cystic lesions of the kidneys, characterized on prior MRI to represent benign cysts.  Enteric contrast extends through the distal small bowel and colon. No evidence of abnormally distended small bowel or colon.  Circumferential soft tissue within the ascending colon with "apple core" configuration, as was described on prior MRI.  There are a few small mesenteric lymph nodes, none of which are enlarged by CT size criteria. Specifically, ileocolic lymph node measures 7 mm with rounded configuration on image 35 of series 2.  Unremarkable appearance of the urinary bladder. Transverse diameter of the prostate measures 5.4 cm.  Small amount of low-density free fluid  layered within the rectovesical space.  Atherosclerotic calcifications of the abdominal aorta extending into the iliofemoral system.  Multilevel degenerative disc disease of the lower thoracic and lumbar spine. No bony canal stenosis. No acute displaced fracture.  IMPRESSION: Interval placement of percutaneous pigtail drainage collapse there into segment 6 liver fluid collection compared to prior MRI. Across modalities, there does appear to be decreased size of this collection, though there is likely some persisting fluid/ edema.  Re- demonstration of circumferential soft tissue within the ascending colon, concerning for colon carcinoma. There is a suspicious, rounded lymph node within the ileocolic nodal station, though is not enlarged by CT size criteria.  Atherosclerosis with evidence of left main in 3 vessel coronary artery disease.  Moderate right-sided pleural effusion with atelectasis.  Signed,  Dulcy Fanny. Earleen Newport, DO  [facility]  [facility]   Electronically Signed   By: Corrie Mckusick D.O.   On: 02/25/2014 19:20    Scheduled Meds: . Influenza vac split quadrivalent PF  0.5 mL Intramuscular Tomorrow-1000  . piperacillin-tazobactam (ZOSYN)  IV  2.25 g Intravenous 3 times per day  . pneumococcal 23 valent vaccine  0.5 mL Intramuscular Tomorrow-1000  . terazosin  5 mg Oral Daily   Continuous Infusions: . sodium chloride 100 mL/hr at 02/25/14 1814   Antibiotics Given (last 72 hours)   Date/Time Action Medication Dose Rate   02/23/14 2340 Given   piperacillin-tazobactam (ZOSYN) IVPB 3.375 g 3.375 g 12.5 mL/hr   02/24/14 0526 Given   vancomycin (VANCOCIN) IVPB 750 mg/150 ml premix 750 mg 150 mL/hr   02/24/14 0834 Given   piperacillin-tazobactam (ZOSYN) IVPB 3.375 g 3.375 g 12.5 mL/hr   02/24/14 1627 Given   piperacillin-tazobactam (ZOSYN) IVPB 3.375 g 3.375 g 12.5 mL/hr   02/25/14 0000 Given   piperacillin-tazobactam (ZOSYN) IVPB 3.375 g 3.375 g  12.5 mL/hr   02/25/14 0840 Given   piperacillin-tazobactam (ZOSYN) IVPB 3.375 g 3.375 g 12.5 mL/hr   02/25/14 1542 Given   piperacillin-tazobactam (ZOSYN) IVPB 3.375 g 3.375 g 12.5 mL/hr   02/26/14  0008 Given   piperacillin-tazobactam (ZOSYN) IVPB 3.375 g 3.375 g 12.5 mL/hr   02/26/14 0831 Given   piperacillin-tazobactam (ZOSYN) IVPB 3.375 g 3.375 g 12.5 mL/hr   02/26/14 1716 Given   piperacillin-tazobactam (ZOSYN) IVPB 2.25 g 2.25 g 100 mL/hr      Principal Problem:   Essential hypertension Active Problems:   Liver abscess   BPH (benign prostatic hyperplasia)   Colonic mass   Acute renal failure    Time spent: 48min    Luverne Zerkle  [facility] Pager [phone]. If 7PM-7AM, please contact night-coverage at [email or web address], password [facility] 02/26/2014, 6:49 PM  LOS: 11 days

## 2014-02-26 NOTE — Progress Notes (Signed)
Patient ID: Brent Little, male   DOB: [birth year], 73 y.o.   MRN: [id number]   Referring Physician(s): [facility]  Subjective:  Liver abscess drain placed 10/12 Output decreasing Low grade fevers last 24hrs, Tmax 99.3 Still with some confusion  Allergies: Review of patient's allergies indicates no known allergies.  Medications: Prior to Admission medications   Medication Sig Start Date End Date Taking? Authorizing Provider  hydrochlorothiazide (HYDRODIURIL) 25 MG tablet Take 25 mg by mouth daily.   Yes Historical Provider, MD  potassium chloride SA (K-DUR,KLOR-CON) 20 MEQ tablet Take 20 mEq by mouth daily.  02/09/14  Yes Historical Provider, MD  ramipril (ALTACE) 10 MG capsule Take 10 mg by mouth daily.  01/29/14  Yes Historical Provider, MD  terazosin (HYTRIN) 5 MG capsule Take 5 mg by mouth daily.  01/29/14  Yes Historical Provider, MD    Review of Systems  Vital Signs: BP 127/51  Pulse 61  Temp(Src) 99.1 F (37.3 C) (Oral)  Resp 18  Ht 6' (1.829 m)  Wt 171 lb 11.8 oz (77.9 kg)  BMI 23.29 kg/m2  SpO2 99%  Physical Exam  Abdominal:  Site of drain intact Clean and dry No bleeding  Scant serous fluid in JP      Imaging: Ct Abdomen Pelvis Wo Contrast  02/25/2014   CLINICAL DATA:  73 year old male admission with liver abscess.  EXAM: CT ABDOMEN AND PELVIS WITHOUT CONTRAST  TECHNIQUE: Multidetector CT imaging of the abdomen and pelvis was performed following the standard protocol without IV contrast.  COMPARISON:  Abdominal ultrasound 02/19/2014, MRI liver protocol 02/13/2014  FINDINGS: Lower chest:  Unremarkable appearance of the subcutaneous tissues of the right chest wall.  Heart size within normal limits. No pericardial fluid/ thickening. Calcifications in the left main, left anterior descending, circumflex, right coronary arteries.  Unremarkable appearance the distal esophagus.  Moderate right pleural effusion.  Atelectasis of the bilateral lung bases. Respiratory motion  somewhat limits evaluation of the lungs for small nodules.  Abdomen/ pelvis:  Postsurgical changes of percutaneous drain placement into segment 6/right liver lobe abscess. Pigtail remains positioned within low-density region in the area of the prior abscess. Focal hypodensity in this region measures approximately 5.0- 5.5 cm. The overall size does appear smaller than the prior MRI, though comparison across modalities is somewhat limited by the tissue resolution on the CT.  Remainder of the liver parenchyma unremarkable.  Unremarkable appearance of the spleen. Unremarkable appearance of bilateral adrenal glands.  No evidence of hydronephrosis. Bilateral low-density cystic lesions of the kidneys, characterized on prior MRI to represent benign cysts.  Enteric contrast extends through the distal small bowel and colon. No evidence of abnormally distended small bowel or colon.  Circumferential soft tissue within the ascending colon with "apple core" configuration, as was described on prior MRI.  There are a few small mesenteric lymph nodes, none of which are enlarged by CT size criteria. Specifically, ileocolic lymph node measures 7 mm with rounded configuration on image 35 of series 2.  Unremarkable appearance of the urinary bladder. Transverse diameter of the prostate measures 5.4 cm.  Small amount of low-density free fluid layered within the rectovesical space.  Atherosclerotic calcifications of the abdominal aorta extending into the iliofemoral system.  Multilevel degenerative disc disease of the lower thoracic and lumbar spine. No bony canal stenosis. No acute displaced fracture.  IMPRESSION: Interval placement of percutaneous pigtail drainage collapse there into segment 6 liver fluid collection compared to prior MRI. Across modalities, there does appear to be decreased  size of this collection, though there is likely some persisting fluid/ edema.  Re- demonstration of circumferential soft tissue within the ascending  colon, concerning for colon carcinoma. There is a suspicious, rounded lymph node within the ileocolic nodal station, though is not enlarged by CT size criteria.  Atherosclerosis with evidence of left main in 3 vessel coronary artery disease.  Moderate right-sided pleural effusion with atelectasis.  Signed,  Dulcy Fanny. Earleen Newport, DO  [facility]  [facility]   Electronically Signed   By: Corrie Mckusick D.O.   On: 02/25/2014 19:20   US Renal  02/24/2014   CLINICAL DATA:  73 year old male with acute renal failure. Hypertension.  EXAM: RENAL/URINARY TRACT ULTRASOUND COMPLETE  COMPARISON:  02/13/2014 abdominal MR  FINDINGS: Right Kidney:  Length: 12.2 cm.  No hydronephrosis.  Cysts measure up to 2 cm.  Left Kidney:  Length: 13.6 cm.  No hydronephrosis.  Cysts measure up to 4.2 cm.  Bladder:  Enlarged prostate gland. Clinical and laboratory correlation recommended. No gross urinary bladder abnormality detected.  IMPRESSION: No hydronephrosis.  Bilateral renal cysts larger on the left.  Enlarged prostate gland. Clinical and laboratory correlation recommended   Electronically Signed   By: Chauncey Cruel M.D.   On: 02/24/2014 03:20    Labs:  CBC:  Recent Labs  02/16/14 0415 02/17/14 0026 02/19/14 0550 02/21/14 1100  WBC 16.6* 16.5* 14.0* 10.5  HGB 9.0* 9.4* 8.6* 8.9*  HCT 26.8* 27.9* 25.6* 26.6*  PLT 303 348 395 383    COAGS:  Recent Labs  02/15/14 1243 02/15/14 1539 02/19/14 0550  INR 1.24 1.18 1.41  APTT 35  --  46*    BMP:  Recent Labs  02/23/14 1929 02/24/14 0510 02/25/14 0425 02/26/14 0920  NA 138 140 140 139  K 3.8 4.0 3.9 4.0  CL 103 105 106 105  CO2 22 19 19  17*  GLUCOSE 118* 101* 98 105*  BUN 21 23 25* 28*  CALCIUM 8.9 8.9 8.6 8.3*  CREATININE 2.24* 2.80* 3.50* 3.92*  GFRNONAA 27* 21* 16* 14*  GFRAA 31* 24* 18* 16*    LIVER FUNCTION TESTS:  Recent Labs  02/17/14 0026 02/19/14 0550 02/21/14 1100 02/23/14 1929  BILITOT  1.8* 1.3* 1.1 0.8  AST 31 30 53* 45*  ALT 23 19 33 39  ALKPHOS 197* 164* 209* 233*  PROT 6.7 6.0 6.4 6.4  ALBUMIN 2.3* 2.0* 2.0* 2.1*    Assessment and Plan:  Liver abscess drain intact Output minimal Low grade fevers 99.3 Last CBC was 10/14 CT shows marked interval improvement in hepatic abscess, though suggests residual fluid. Will review with IR MD for consideration of removal of drain, though would preferably like to see nl WBC and afebrile.    I spent a total of 15 minutes face to face in clinical consultation/evaluation, greater than 50% of which was counseling/coordinating care for liver abscess drain  Signed: Ascencion Dike 02/26/2014, 10:25 AM

## 2014-02-27 LAB — HEPATIC FUNCTION PANEL
ALBUMIN: 2.1 g/dL — AB (ref 3.5–5.2)
ALK PHOS: 166 U/L — AB (ref 39–117)
ALT: 31 U/L (ref 0–53)
AST: 33 U/L (ref 0–37)
Bilirubin, Direct: 0.3 mg/dL (ref 0.0–0.3)
Indirect Bilirubin: 0.2 mg/dL — ABNORMAL LOW (ref 0.3–0.9)
Total Bilirubin: 0.5 mg/dL (ref 0.3–1.2)
Total Protein: 6.1 g/dL (ref 6.0–8.3)

## 2014-02-27 LAB — RENAL FUNCTION PANEL
ALBUMIN: 2.1 g/dL — AB (ref 3.5–5.2)
Anion gap: 17 — ABNORMAL HIGH (ref 5–15)
BUN: 32 mg/dL — AB (ref 6–23)
CHLORIDE: 107 meq/L (ref 96–112)
CO2: 16 mEq/L — ABNORMAL LOW (ref 19–32)
CREATININE: 4.11 mg/dL — AB (ref 0.50–1.35)
Calcium: 8.3 mg/dL — ABNORMAL LOW (ref 8.4–10.5)
GFR calc Af Amer: 15 mL/min — ABNORMAL LOW (ref >90)
GFR calc non Af Amer: 13 mL/min — ABNORMAL LOW (ref >90)
Glucose, Bld: 83 mg/dL (ref 70–99)
Phosphorus: 4 mg/dL (ref 2.3–4.6)
Potassium: 4 mEq/L (ref 3.7–5.3)
Sodium: 140 mEq/L (ref 137–147)

## 2014-02-27 LAB — PROTEIN ELECTROPHORESIS, SERUM
Albumin ELP: 42.7 % — ABNORMAL LOW (ref 55.8–66.1)
Alpha-1-Globulin: 17.3 % — ABNORMAL HIGH (ref 2.9–4.9)
Alpha-2-Globulin: 13.5 % — ABNORMAL HIGH (ref 7.1–11.8)
Beta 2: 5.6 % (ref 3.2–6.5)
Beta Globulin: 6.8 % (ref 4.7–7.2)
Gamma Globulin: 14.1 % (ref 11.1–18.8)
M-SPIKE, %: NOT DETECTED g/dL
TOTAL PROTEIN ELP: 5.1 g/dL — AB (ref 6.0–8.3)

## 2014-02-27 LAB — IRON AND TIBC
IRON: 14 ug/dL — AB (ref 42–135)
Saturation Ratios: 9 % — ABNORMAL LOW (ref 20–55)
TIBC: 163 ug/dL — ABNORMAL LOW (ref 215–435)
UIBC: 149 ug/dL (ref 125–400)

## 2014-02-27 MED ORDER — STERILE WATER FOR INJECTION IV SOLN
INTRAVENOUS | Status: DC
  Administered 2014-02-27 – 2014-03-01 (×4): via INTRAVENOUS
  Filled 2014-02-27 (×9): qty 850

## 2014-02-27 NOTE — Consult Note (Signed)
S: Feeling better today, anxious to go home, reports not all urine output being recorded O:BP 102/36  Pulse 49  Temp(Src) 98.6 F (37 C) (Oral)  Resp 18  Ht 6' (1.829 m)  Wt 171 lb 15.3 oz (78 kg)  BMI 23.32 kg/m2  SpO2 98%  Intake/Output Summary (Last 24 hours) at 02/27/14 1154 Last data filed at 02/27/14 8338  Gross per 24 hour  Intake    800 ml  Output    100 ml  Net    700 ml   Intake/Output: I/O last 3 completed shifts: In: 800 [I.V.:800] Out: 310 [Urine:300; Drains:10]  Intake/Output this shift:    Weight change: 3.5 oz (0.1 kg) Gen:NAD, thin elderly gentleman, lying in bed CVS:RRR, no MRG Resp:CTAB SNK:NLZJ, NTND, +BS Ext:WWP, no LE edema   Recent Labs Lab 02/21/14 1100 02/23/14 0350 02/23/14 1929 02/24/14 0510 02/25/14 0425 02/26/14 0920 02/27/14 0404  NA 137 139 138 140 140 139 140  K 3.6* 3.9 3.8 4.0 3.9 4.0 4.0  CL 100 104 103 105 106 105 107  CO2 23 21 22 19 19  17* 16*  GLUCOSE 114* 93 118* 101* 98 105* 83  BUN 16 18 21 23  25* 28* 32*  CREATININE 1.26 1.70* 2.24* 2.80* 3.50* 3.92* 4.11*  ALBUMIN 2.0*  --  2.1*  --   --   --  2.1*  CALCIUM 8.9 8.9 8.9 8.9 8.6 8.3* 8.3*  PHOS  --   --   --   --   --   --  4.0  AST 53*  --  45*  --   --   --   --   ALT 33  --  39  --   --   --   --    Liver Function Tests:  Recent Labs Lab 02/21/14 1100 02/23/14 1929 02/27/14 0404  AST 53* 45*  --   ALT 33 39  --   ALKPHOS 209* 233*  --   BILITOT 1.1 0.8  --   PROT 6.4 6.4  --   ALBUMIN 2.0* 2.1* 2.1*   No results found for this basename: LIPASE, AMYLASE,  in the last 168 hours No results found for this basename: AMMONIA,  in the last 168 hours CBC:  Recent Labs Lab 02/21/14 1100  WBC 10.5  HGB 8.9*  HCT 26.6*  MCV 78.2  PLT 383   Cardiac Enzymes:  Recent Labs Lab 02/25/14 1537  CKTOTAL 22   CBG: No results found for this basename: GLUCAP,  in the last 168 hours  Iron Studies: No results found for this basename: IRON, TIBC,  TRANSFERRIN, FERRITIN,  in the last 72 hours Studies/Results: Ct Abdomen Pelvis Wo Contrast  02/25/2014   CLINICAL DATA:  73 year old male admission with liver abscess.  EXAM: CT ABDOMEN AND PELVIS WITHOUT CONTRAST  TECHNIQUE: Multidetector CT imaging of the abdomen and pelvis was performed following the standard protocol without IV contrast.  COMPARISON:  Abdominal ultrasound 02/19/2014, MRI liver protocol 02/13/2014  FINDINGS: Lower chest:  Unremarkable appearance of the subcutaneous tissues of the right chest wall.  Heart size within normal limits. No pericardial fluid/ thickening. Calcifications in the left main, left anterior descending, circumflex, right coronary arteries.  Unremarkable appearance the distal esophagus.  Moderate right pleural effusion.  Atelectasis of the bilateral lung bases. Respiratory motion somewhat limits evaluation of the lungs for small nodules.  Abdomen/ pelvis:  Postsurgical changes of percutaneous drain placement into segment 6/right liver lobe abscess. Pigtail remains  positioned within low-density region in the area of the prior abscess. Focal hypodensity in this region measures approximately 5.0- 5.5 cm. The overall size does appear smaller than the prior MRI, though comparison across modalities is somewhat limited by the tissue resolution on the CT.  Remainder of the liver parenchyma unremarkable.  Unremarkable appearance of the spleen. Unremarkable appearance of bilateral adrenal glands.  No evidence of hydronephrosis. Bilateral low-density cystic lesions of the kidneys, characterized on prior MRI to represent benign cysts.  Enteric contrast extends through the distal small bowel and colon. No evidence of abnormally distended small bowel or colon.  Circumferential soft tissue within the ascending colon with "apple core" configuration, as was described on prior MRI.  There are a few small mesenteric lymph nodes, none of which are enlarged by CT size criteria. Specifically,  ileocolic lymph node measures 7 mm with rounded configuration on image 35 of series 2.  Unremarkable appearance of the urinary bladder. Transverse diameter of the prostate measures 5.4 cm.  Small amount of low-density free fluid layered within the rectovesical space.  Atherosclerotic calcifications of the abdominal aorta extending into the iliofemoral system.  Multilevel degenerative disc disease of the lower thoracic and lumbar spine. No bony canal stenosis. No acute displaced fracture.  IMPRESSION: Interval placement of percutaneous pigtail drainage collapse there into segment 6 liver fluid collection compared to prior MRI. Across modalities, there does appear to be decreased size of this collection, though there is likely some persisting fluid/ edema.  Re- demonstration of circumferential soft tissue within the ascending colon, concerning for colon carcinoma. There is a suspicious, rounded lymph node within the ileocolic nodal station, though is not enlarged by CT size criteria.  Atherosclerosis with evidence of left main in 3 vessel coronary artery disease.  Moderate right-sided pleural effusion with atelectasis.  Signed,  Dulcy Fanny. Earleen Newport, DO  [facility]  [facility]   Electronically Signed   By: Corrie Mckusick D.O.   On: 02/25/2014 19:20   . Influenza vac split quadrivalent PF  0.5 mL Intramuscular Tomorrow-1000  . pneumococcal 23 valent vaccine  0.5 mL Intramuscular Tomorrow-1000  . terazosin  5 mg Oral Daily    Assessment/Plan:  1. AKI/CKD in setting of volume depletion/hypotension/Ace-inhibition/NSAIDs/vanc toxicity. Continue to hold vanco and renal dose medication.  1. ordered SPEP/UPEP, Urine eosinophils to r/o AIN related to Zosyn 2. Cont with IVF's given poor po intake and volume down 3. Replete K very carefully 2. Liver abscess vs necrotic mets- will need tissue diagnosis to help guide therapy as well as prognosis.  1. CT showed decreased size  of hepatic fluid collection 3. Anemia- normocytic-?GIB vs due to malignancy  1. Checking iron stores, SPEP/UPEP, and will need colonoscopy, family hopeful this can be done during her stay 4. HTN- stable, cont to hold ACE 5. Encephalopathy-improved 6. N/V/D/protein malnutrition- GI following, cdif negative 7. Colon mass- as above 1. Family requesting colonoscopy inpatient 8. Severe protein malnutrition   [name], Elena  Renal: Appears to be plateauing  with renal function and remains nonoliguric.  Increased acidosis requires switch to IV bicarbonate.  I spoke to wife and pt with updated information.  Klye Besecker C

## 2014-02-27 NOTE — Progress Notes (Addendum)
[facility] PROGRESS NOTE  Brent Little XVQ:[id number] DOB: 1940-10-10 DOA: 02/15/2014 PCP: [name], MD  Brief Narrative: Brent Little is a 73 y.o. male, With H/O HTN, Kidney stone, who initially presented to [facility] about one week ago with fever, dull right upper quadrant pain and a dry cough. He was diagnosed there with a possible community-acquired pneumonia, was placed on Levaquin, he had marginal improvement. During his fever workup he had a right upper quadrant ultrasound which showed some nonspecific changes . He was then discharged home with outpatient MRI, MRI done in the outpatient setting showed a septated cystic lesion in the right hepatic lobe concerning for abscess versus metastatic necrotic mass. MRI also showed an ascending colon circumferential mass. He presented to [facility] with fever, chills, decreased appetite and lethargy. Now being followed by [facility]/GI and IR. Has been on broad spectrum Abx, finally underwent Liver biopsy 10/12,. And a drain put it. His creatinine started creeping up on 10/16. Vancomycin trough came back elevated. Stopped vancomycin and renal consulted. His blood cultures and cultures fromt he liver abscess have all come back negative and zosyn discontinued on 10/19 after completing 10 days of IV antibiotics. His renal function has been increasing despite IV fluids.  Family also wants to know if GI can do the colonoscopy during this hospitalization, rather than outpatient.    Assessment/Plan: Liver abscess vs necrotic mets;  underwent US guided biopsy 10/12; yielded purulent appearing material12F drain was placed under US guidance.-Culture-gram stain-no organisms seen, FU cultures and blood cultures are  negative.  Completed 10 days of IV antibiotics. Vancomycin was stopped on 10/16 after the trough came back elevated.  A repeat CT abdomen and pelvis without contrast showed decrease in the size of the collection. IR to take the drain out in the next  few days.    Colon mass in the ascending colon: -never had colonoscopy,. GI consulted for colonoscopy, recommended outpatient colonoscopy when he re covers from the abscess, but patient's wife wanted to see if he can get the colonoscopy while in the hospital.  -CEA mildly elevated  3. HTN -stable, ACE on hold  4. Anemia -anemia panel c/w Iron deficiency.  5. ? Dysuria -UA benign  6. Confusion/encephalopathy -persistent, will get MRI brain for further evaluation. Initially thought to be secondary to narcotic pain medications and we stopped them,. His mental status did improve after we stopped the narcotics, but he remains confused. Getting an MRI brain to see if he had any metastatic lesions vs stroke.   7. Acute renal failure: probably secondary to vancomycin toxicity vs ace inhibition vs NSAID use vs volume depletion.   US RENAL is negative for hydronephrosis. UA is negative. Vancomycin troough is 47.3  measure Intake and output and gentle hydration. Repeat renal parameters later are rising. Renal consulted and recommendations given.   8. Bradycardia:  Asymptomatic. And EKG shows sinus bradycardia.   DVT proph: lovenox  Code Status: Full Code Family Communication: wife at bedside Disposition Plan: SNF when stable   Consultants:  GI  CCS  IR  HPI/Subjective: Comfortable, cheerful. Mental status clearing up.  Pain is better. No vomiting today.   Objective: Filed Vitals:   02/27/14 0431  BP: 102/36  Pulse: 49  Temp: 98.6 F (37 C)  Resp: 18    Intake/Output Summary (Last 24 hours) at 02/27/14 1247 Last data filed at 02/27/14 0932  Gross per 24 hour  Intake    800 ml  Output    100  ml  Net    700 ml   Filed Weights   02/25/14 0428 02/26/14 0500 02/27/14 0431  Weight: 78.336 kg (172 lb 11.2 oz) 77.9 kg (171 lb 11.8 oz) 78 kg (171 lb 15.3 oz)    Exam:   General:  alert and conversant, but confused..   Cardiovascular: S1S2/RRR  Respiratory:  CTAB  Abdomen: soft, NO tenderness in RUQ, BS present, drain  Musculoskeletal: no edema c/c   Data Reviewed: Basic Metabolic Panel:  Recent Labs Lab 02/23/14 1929 02/24/14 0510 02/25/14 0425 02/26/14 0920 02/27/14 0404  NA 138 140 140 139 140  K 3.8 4.0 3.9 4.0 4.0  CL 103 105 106 105 107  CO2 22 19 19  17* 16*  GLUCOSE 118* 101* 98 105* 83  BUN 21 23 25* 28* 32*  CREATININE 2.24* 2.80* 3.50* 3.92* 4.11*  CALCIUM 8.9 8.9 8.6 8.3* 8.3*  PHOS  --   --   --   --  4.0   Liver Function Tests:  Recent Labs Lab 02/21/14 1100 02/23/14 1929 02/27/14 0404  AST 53* 45*  --   ALT 33 39  --   ALKPHOS 209* 233*  --   BILITOT 1.1 0.8  --   PROT 6.4 6.4  --   ALBUMIN 2.0* 2.1* 2.1*   No results found for this basename: LIPASE, AMYLASE,  in the last 168 hours No results found for this basename: AMMONIA,  in the last 168 hours CBC:  Recent Labs Lab 02/21/14 1100  WBC 10.5  HGB 8.9*  HCT 26.6*  MCV 78.2  PLT 383   Cardiac Enzymes:  Recent Labs Lab 02/25/14 1537  CKTOTAL 22   BNP (last 3 results) No results found for this basename: PROBNP,  in the last 8760 hours CBG: No results found for this basename: GLUCAP,  in the last 168 hours  Recent Results (from the past 240 hour(s))  GRAM STAIN     Status: None   Collection Time    02/19/14 12:17 PM      Result Value Ref Range Status   Specimen Description ABSCESS RIGHT LIVER   Final   Special Requests Normal   Final   Gram Stain     Final   Value: ABUNDANT WBC PRESENT,BOTH PMN AND MONONUCLEAR     NO ORGANISMS SEEN   Report Status 02/19/2014 FINAL   Final  CULTURE, ROUTINE-ABSCESS     Status: None   Collection Time    02/19/14 12:17 PM      Result Value Ref Range Status   Specimen Description ABSCESS RIGHT LIVER   Final   Special Requests NONE   Final   Gram Stain     Final   Value: ABUNDANT WBC PRESENT,BOTH PMN AND MONONUCLEAR     NO SQUAMOUS EPITHELIAL CELLS SEEN     NO ORGANISMS SEEN     Performed at  [facility]     Performed at [facility]   Culture     Final   Value: NO GROWTH 3 DAYS     Performed at [facility]   Report Status 02/22/2014 FINAL   Final  URINE CULTURE     Status: None   Collection Time    02/20/14 12:45 AM      Result Value Ref Range Status   Specimen Description URINE, CLEAN CATCH   Final   Special Requests NONE   Final   Culture  Setup Time     Final  Value: 02/20/2014 05:06     Performed at [facility]     Final   Value: NO GROWTH     Performed at [facility]   Culture     Final   Value: NO GROWTH     Performed at [facility]   Report Status 02/21/2014 FINAL   Final  CLOSTRIDIUM DIFFICILE BY PCR     Status: None   Collection Time    02/25/14  2:17 PM      Result Value Ref Range Status   C difficile by pcr NEGATIVE  NEGATIVE Final     Studies: Ct Abdomen Pelvis Wo Contrast  02/25/2014   CLINICAL DATA:  73 year old male admission with liver abscess.  EXAM: CT ABDOMEN AND PELVIS WITHOUT CONTRAST  TECHNIQUE: Multidetector CT imaging of the abdomen and pelvis was performed following the standard protocol without IV contrast.  COMPARISON:  Abdominal ultrasound 02/19/2014, MRI liver protocol 02/13/2014  FINDINGS: Lower chest:  Unremarkable appearance of the subcutaneous tissues of the right chest wall.  Heart size within normal limits. No pericardial fluid/ thickening. Calcifications in the left main, left anterior descending, circumflex, right coronary arteries.  Unremarkable appearance the distal esophagus.  Moderate right pleural effusion.  Atelectasis of the bilateral lung bases. Respiratory motion somewhat limits evaluation of the lungs for small nodules.  Abdomen/ pelvis:  Postsurgical changes of percutaneous drain placement into segment 6/right liver lobe abscess. Pigtail remains positioned within low-density region in the area of the prior abscess. Focal hypodensity in this region measures  approximately 5.0- 5.5 cm. The overall size does appear smaller than the prior MRI, though comparison across modalities is somewhat limited by the tissue resolution on the CT.  Remainder of the liver parenchyma unremarkable.  Unremarkable appearance of the spleen. Unremarkable appearance of bilateral adrenal glands.  No evidence of hydronephrosis. Bilateral low-density cystic lesions of the kidneys, characterized on prior MRI to represent benign cysts.  Enteric contrast extends through the distal small bowel and colon. No evidence of abnormally distended small bowel or colon.  Circumferential soft tissue within the ascending colon with "apple core" configuration, as was described on prior MRI.  There are a few small mesenteric lymph nodes, none of which are enlarged by CT size criteria. Specifically, ileocolic lymph node measures 7 mm with rounded configuration on image 35 of series 2.  Unremarkable appearance of the urinary bladder. Transverse diameter of the prostate measures 5.4 cm.  Small amount of low-density free fluid layered within the rectovesical space.  Atherosclerotic calcifications of the abdominal aorta extending into the iliofemoral system.  Multilevel degenerative disc disease of the lower thoracic and lumbar spine. No bony canal stenosis. No acute displaced fracture.  IMPRESSION: Interval placement of percutaneous pigtail drainage collapse there into segment 6 liver fluid collection compared to prior MRI. Across modalities, there does appear to be decreased size of this collection, though there is likely some persisting fluid/ edema.  Re- demonstration of circumferential soft tissue within the ascending colon, concerning for colon carcinoma. There is a suspicious, rounded lymph node within the ileocolic nodal station, though is not enlarged by CT size criteria.  Atherosclerosis with evidence of left main in 3 vessel coronary artery disease.  Moderate right-sided pleural effusion with atelectasis.   Signed,  Dulcy Fanny. Earleen Newport, DO  [facility]  [facility]   Electronically Signed   By: Corrie Mckusick D.O.   On: 02/25/2014 19:20  Scheduled Meds: . Influenza vac split quadrivalent PF  0.5 mL Intramuscular Tomorrow-1000  . pneumococcal 23 valent vaccine  0.5 mL Intramuscular Tomorrow-1000  . terazosin  5 mg Oral Daily   Continuous Infusions: . sodium chloride 100 mL/hr (02/27/14 0533)   Antibiotics Given (last 72 hours)   Date/Time Action Medication Dose Rate   02/24/14 1627 Given   piperacillin-tazobactam (ZOSYN) IVPB 3.375 g 3.375 g 12.5 mL/hr   02/25/14 0000 Given   piperacillin-tazobactam (ZOSYN) IVPB 3.375 g 3.375 g 12.5 mL/hr   02/25/14 0840 Given   piperacillin-tazobactam (ZOSYN) IVPB 3.375 g 3.375 g 12.5 mL/hr   02/25/14 1542 Given   piperacillin-tazobactam (ZOSYN) IVPB 3.375 g 3.375 g 12.5 mL/hr   02/26/14 0008 Given   piperacillin-tazobactam (ZOSYN) IVPB 3.375 g 3.375 g 12.5 mL/hr   02/26/14 0831 Given   piperacillin-tazobactam (ZOSYN) IVPB 3.375 g 3.375 g 12.5 mL/hr   02/26/14 1716 Given   piperacillin-tazobactam (ZOSYN) IVPB 2.25 g 2.25 g 100 mL/hr      Principal Problem:   Essential hypertension Active Problems:   Liver abscess   BPH (benign prostatic hyperplasia)   Colonic mass   Acute renal failure    Time spent: 107min    Leldon Steege  [facility] Pager [phone]. If 7PM-7AM, please contact night-coverage at [email or web address], password [facility] 02/27/2014, 12:47 PM  LOS: 12 days

## 2014-02-28 ENCOUNTER — Inpatient Hospital Stay (HOSPITAL_COMMUNITY): Payer: Medicare Other

## 2014-02-28 DIAGNOSIS — K7689 Other specified diseases of liver: Secondary | ICD-10-CM

## 2014-02-28 LAB — RENAL FUNCTION PANEL
ANION GAP: 13 (ref 5–15)
Albumin: 1.9 g/dL — ABNORMAL LOW (ref 3.5–5.2)
BUN: 32 mg/dL — ABNORMAL HIGH (ref 6–23)
CO2: 24 mEq/L (ref 19–32)
CREATININE: 3.91 mg/dL — AB (ref 0.50–1.35)
Calcium: 8.1 mg/dL — ABNORMAL LOW (ref 8.4–10.5)
Chloride: 102 mEq/L (ref 96–112)
GFR calc Af Amer: 16 mL/min — ABNORMAL LOW (ref >90)
GFR calc non Af Amer: 14 mL/min — ABNORMAL LOW (ref >90)
GLUCOSE: 115 mg/dL — AB (ref 70–99)
POTASSIUM: 3.9 meq/L (ref 3.7–5.3)
Phosphorus: 3.5 mg/dL (ref 2.3–4.6)
Sodium: 139 mEq/L (ref 137–147)

## 2014-02-28 LAB — CBC
HCT: 25.2 % — ABNORMAL LOW (ref 39.0–52.0)
HCT: 23.9 % — ABNORMAL LOW (ref 39.0–52.0)
HEMOGLOBIN: 8.4 g/dL — AB (ref 13.0–17.0)
HEMOGLOBIN: 8 g/dL — AB (ref 13.0–17.0)
MCH: 26.5 pg (ref 26.0–34.0)
MCH: 25.7 pg — AB (ref 26.0–34.0)
MCHC: 33.3 g/dL (ref 30.0–36.0)
MCHC: 33.5 g/dL (ref 30.0–36.0)
MCV: 79.5 fL (ref 78.0–100.0)
MCV: 76.8 fL — AB (ref 78.0–100.0)
Platelets: 254 10*3/uL (ref 150–400)
Platelets: 236 10*3/uL (ref 150–400)
RBC: 3.17 MIL/uL — ABNORMAL LOW (ref 4.22–5.81)
RBC: 3.11 MIL/uL — AB (ref 4.22–5.81)
RDW: 15.6 % — ABNORMAL HIGH (ref 11.5–15.5)
RDW: 15.5 % (ref 11.5–15.5)
WBC: 8.8 10*3/uL (ref 4.0–10.5)
WBC: 9.1 10*3/uL (ref 4.0–10.5)

## 2014-02-28 MED ORDER — LOPERAMIDE HCL 2 MG PO CAPS
2.0000 mg | ORAL_CAPSULE | Freq: Once | ORAL | Status: AC
  Administered 2014-02-28: 2 mg via ORAL
  Filled 2014-02-28: qty 1

## 2014-02-28 MED ORDER — FERROUS SULFATE 325 (65 FE) MG PO TABS
325.0000 mg | ORAL_TABLET | Freq: Every day | ORAL | Status: DC
  Administered 2014-02-28: 325 mg via ORAL
  Filled 2014-02-28 (×3): qty 1

## 2014-02-28 NOTE — Progress Notes (Signed)
Noted ?? Bloody stool when patient had BM, patient denies any distress, notified Dr. Maryland Pink, will continue to assess patient.

## 2014-02-28 NOTE — Consult Note (Signed)
S: Had a rough night with lots of diarrhea, no appetite this am and says all food is flavorless O:BP 149/62  Pulse 62  Temp(Src) 98 F (36.7 C) (Oral)  Resp 18  Ht 6' (1.829 m)  Wt 179 lb 14.3 oz (81.6 kg)  BMI 24.39 kg/m2  SpO2 95%  Intake/Output Summary (Last 24 hours) at 02/28/14 0754 Last data filed at 02/28/14 0400  Gross per 24 hour  Intake 383.33 ml  Output    910 ml  Net -526.67 ml   Intake/Output: I/O last 3 completed shifts: In: 1183.3 [I.V.:1178.3; Other:5] Out: 910 [Urine:900; Drains:10]  Intake/Output this shift:    Weight change: 7 lb 15 oz (3.6 kg) Gen:NAD, thin elderly gentleman, lying in bed CVS:RRR, no MRG Resp:CTAB EHU:DJSH, NTND, +BS Ext:WWP, no LE edema   Recent Labs Lab 02/21/14 1100 02/23/14 0350 02/23/14 1929 02/24/14 0510 02/25/14 0425 02/26/14 0920 02/27/14 0404  NA 137 139 138 140 140 139 140  K 3.6* 3.9 3.8 4.0 3.9 4.0 4.0  CL 100 104 103 105 106 105 107  CO2 23 21 22 19 19  17* 16*  GLUCOSE 114* 93 118* 101* 98 105* 83  BUN 16 18 21 23  25* 28* 32*  CREATININE 1.26 1.70* 2.24* 2.80* 3.50* 3.92* 4.11*  ALBUMIN 2.0*  --  2.1*  --   --   --  2.1*  2.1*  CALCIUM 8.9 8.9 8.9 8.9 8.6 8.3* 8.3*  PHOS  --   --   --   --   --   --  4.0  AST 53*  --  45*  --   --   --  33  ALT 33  --  39  --   --   --  31   Liver Function Tests:  Recent Labs Lab 02/21/14 1100 02/23/14 1929 02/27/14 0404  AST 53* 45* 33  ALT 33 39 31  ALKPHOS 209* 233* 166*  BILITOT 1.1 0.8 0.5  PROT 6.4 6.4 6.1  ALBUMIN 2.0* 2.1* 2.1*  2.1*   No results found for this basename: LIPASE, AMYLASE,  in the last 168 hours No results found for this basename: AMMONIA,  in the last 168 hours CBC:  Recent Labs Lab 02/21/14 1100  WBC 10.5  HGB 8.9*  HCT 26.6*  MCV 78.2  PLT 383   Cardiac Enzymes:  Recent Labs Lab 02/25/14 1537  CKTOTAL 22   CBG: No results found for this basename: GLUCAP,  in the last 168 hours  Iron Studies:   Recent Labs  02/27/14 0404  IRON 14*  TIBC 163*   Studies/Results: Mr Brain Wo Contrast  02/28/2014   CLINICAL DATA:  Initial evaluation for acute confusion.  EXAM: MRI HEAD WITHOUT CONTRAST  TECHNIQUE: Multiplanar, multiecho pulse sequences of the brain and surrounding structures were obtained without intravenous contrast.  COMPARISON:  Prior CT from 02/07/2014  FINDINGS: Mild diffuse prominence of the CSF containing spaces is compatible with generalized cerebral atrophy. Minimal patchy T2/FLAIR hyperintensity present within the periventricular and deep white matter both cerebral hemispheres, likely related to very mild chronic microvascular ischemic changes.  No mass lesion, midline shift, or extra-axial fluid collection. Ventricles are normal in size without evidence of hydrocephalus.  No diffusion-weighted signal abnormality is identified to suggest acute intracranial infarct. Gray-white matter differentiation is maintained. Normal flow voids are seen within the intracranial vasculature. No intracranial hemorrhage identified.  The cervicomedullary junction is normal. Pituitary gland is within normal limits. Pituitary stalk is midline.  The globes and optic nerves demonstrate a normal appearance with normal signal intensity. The  The bone marrow signal intensity is normal. Calvarium is intact. Visualized upper cervical spine is within normal limits.  Scalp soft tissues are unremarkable.  Paranasal sinuses are clear.  No mastoid effusion.  IMPRESSION: 1. No acute intracranial infarct or other abnormality identified. 2. Mild age-related atrophy with chronic microvascular ischemic disease.   Electronically Signed   By: Jeannine Boga M.D.   On: 02/28/2014 05:02   . Influenza vac split quadrivalent PF  0.5 mL Intramuscular Tomorrow-1000  . pneumococcal 23 valent vaccine  0.5 mL Intramuscular Tomorrow-1000  . terazosin  5 mg Oral Daily    Assessment/Plan:  1. AKI/CKD in setting of volume  depletion/hypotension/Ace-inhibition/NSAIDs/vanc toxicity. Continue to hold vanco and renal dose medication.  1. ordered SPEP/UPEP, Urine eosinophils to r/o AIN related to Zosyn 2. Cont with bicarb fluids given poor po intake and volume down 2. Liver abscess vs necrotic mets- will need tissue diagnosis to help guide therapy as well as prognosis.  1. CT showed decreased size of hepatic fluid collection 3. Anemia- normocytic-?GIB vs due to malignancy  1. Checking iron stores, SPEP/UPEP, and will need colonoscopy, family hopeful this can be done during his stay 4. HTN- stable, cont to hold ACE 5. Encephalopathy-returned, primary service to eval with MRI brain, negative 6. N/V/D/protein malnutrition- GI following, cdif negative 7. Colon mass- as above 1. Family requesting colonoscopy inpatient 8. Severe protein malnutrition -per primary  Beverlyn Roux  Renal Attending: Kidney function has plateaud. Still acidotic and getting IV bicarb.  Will continue support.   Fujiko Picazo C

## 2014-02-28 NOTE — Progress Notes (Signed)
[facility] Progress Note  Subjective: We were reconsulted about concerns of rectal bleeding today. Pt not aware of bleeding burt states stool had a pinkish color he attributed to eating red velvet cake  Objective: Vital signs in last 24 hours: Temp:  [98 F (36.7 C)-98.9 F (37.2 C)] 98.1 F (36.7 C) (10/21 1500) Pulse Rate:  [54-62] 54 (10/21 1500) Resp:  [18] 18 (10/21 1500) BP: (115-149)/(39-68) 115/51 mmHg (10/21 1500) SpO2:  [94 %-95 %] 94 % (10/21 1500) Weight:  [81.6 kg (179 lb 14.3 oz)] 81.6 kg (179 lb 14.3 oz) (10/21 0300) Weight change: 3.6 kg (7 lb 15 oz)   EX:HBZJIRC soft  Lab Results: Results for orders placed during the hospital encounter of 02/15/14 (from the past 24 hour(s))  RENAL FUNCTION PANEL     Status: Abnormal   Collection Time    02/28/14  8:54 AM      Result Value Ref Range   Sodium 139  137 - 147 mEq/L   Potassium 3.9  3.7 - 5.3 mEq/L   Chloride 102  96 - 112 mEq/L   CO2 24  19 - 32 mEq/L   Glucose, Bld 115 (*) 70 - 99 mg/dL   BUN 32 (*) 6 - 23 mg/dL   Creatinine, Ser 3.91 (*) 0.50 - 1.35 mg/dL   Calcium 8.1 (*) 8.4 - 10.5 mg/dL   Phosphorus 3.5  2.3 - 4.6 mg/dL   Albumin 1.9 (*) 3.5 - 5.2 g/dL   GFR calc non Af Amer 14 (*) >90 mL/min   GFR calc Af Amer 16 (*) >90 mL/min   Anion gap 13  5 - 15  CBC     Status: Abnormal   Collection Time    02/28/14  8:54 AM      Result Value Ref Range   WBC 8.8  4.0 - 10.5 K/uL   RBC 3.17 (*) 4.22 - 5.81 MIL/uL   Hemoglobin 8.4 (*) 13.0 - 17.0 g/dL   HCT 25.2 (*) 39.0 - 52.0 %   MCV 79.5  78.0 - 100.0 fL   MCH 26.5  26.0 - 34.0 pg   MCHC 33.3  30.0 - 36.0 g/dL   RDW 15.6 (*) 11.5 - 15.5 %   Platelets 254  150 - 400 K/uL    Studies/Results: Mr Brain Wo Contrast  02/28/2014   CLINICAL DATA:  Initial evaluation for acute confusion.  EXAM: MRI HEAD WITHOUT CONTRAST  TECHNIQUE: Multiplanar, multiecho pulse sequences of the brain and surrounding structures were obtained without intravenous  contrast.  COMPARISON:  Prior CT from 02/07/2014  FINDINGS: Mild diffuse prominence of the CSF containing spaces is compatible with generalized cerebral atrophy. Minimal patchy T2/FLAIR hyperintensity present within the periventricular and deep white matter both cerebral hemispheres, likely related to very mild chronic microvascular ischemic changes.  No mass lesion, midline shift, or extra-axial fluid collection. Ventricles are normal in size without evidence of hydrocephalus.  No diffusion-weighted signal abnormality is identified to suggest acute intracranial infarct. Gray-white matter differentiation is maintained. Normal flow voids are seen within the intracranial vasculature. No intracranial hemorrhage identified.  The cervicomedullary junction is normal. Pituitary gland is within normal limits. Pituitary stalk is midline. The globes and optic nerves demonstrate a normal appearance with normal signal intensity. The  The bone marrow signal intensity is normal. Calvarium is intact. Visualized upper cervical spine is within normal limits.  Scalp soft tissues are unremarkable.  Paranasal sinuses are clear.  No mastoid effusion.  IMPRESSION: 1. No  acute intracranial infarct or other abnormality identified. 2. Mild age-related atrophy with chronic microvascular ischemic disease.   Electronically Signed   By: Jeannine Boga M.D.   On: 02/28/2014 05:02      Assessment: Ascending colon mass, patient would like to have colonoscopy in [place] after discharge Unclear whether having clinically significant GI bleeding  Plan: Monitor stools and hb. Reassess tomorrow and could do colon Friday if he desires    Whisper Kurka C 02/28/2014, 3:25 PM

## 2014-02-28 NOTE — Progress Notes (Signed)
Patient picked up at 1550pm, up with therapy ambulating in hallway. Sherrie Mustache

## 2014-02-28 NOTE — Progress Notes (Signed)
Subjective: Patient states his abdominal pain is much improved compared to prior to drain placement. He denies any RUQ pain, fever or chills. He and his wife state for the last 3-4 days minimal output in drain.   Allergies: Review of patient's allergies indicates no known allergies.  Medications: Prior to Admission medications   Medication Sig Start Date End Date Taking? Authorizing Provider  hydrochlorothiazide (HYDRODIURIL) 25 MG tablet Take 25 mg by mouth daily.   Yes Historical Provider, MD  potassium chloride SA (K-DUR,KLOR-CON) 20 MEQ tablet Take 20 mEq by mouth daily.  02/09/14  Yes Historical Provider, MD  ramipril (ALTACE) 10 MG capsule Take 10 mg by mouth daily.  01/29/14  Yes Historical Provider, MD  terazosin (HYTRIN) 5 MG capsule Take 5 mg by mouth daily.  01/29/14  Yes Historical Provider, MD    Review of Systems  Vital Signs: BP 140/68  Pulse 62  Temp(Src) 98 F (36.7 C) (Oral)  Resp 18  Ht 6' (1.829 m)  Wt 179 lb 14.3 oz (81.6 kg)  BMI 24.39 kg/m2  SpO2 95%  Physical Exam General: A&Ox3, NAD Abd: RUQ per drain C/D/I, NT, soft, serous output 3 cc in JP bulb, less than 10cc in 24 hrs  Imaging: Ct Abdomen Pelvis Wo Contrast  02/25/2014   CLINICAL DATA:  73 year old male admission with liver abscess.  EXAM: CT ABDOMEN AND PELVIS WITHOUT CONTRAST  TECHNIQUE: Multidetector CT imaging of the abdomen and pelvis was performed following the standard protocol without IV contrast.  COMPARISON:  Abdominal ultrasound 02/19/2014, MRI liver protocol 02/13/2014  FINDINGS: Lower chest:  Unremarkable appearance of the subcutaneous tissues of the right chest wall.  Heart size within normal limits. No pericardial fluid/ thickening. Calcifications in the left main, left anterior descending, circumflex, right coronary arteries.  Unremarkable appearance the distal esophagus.  Moderate right pleural effusion.  Atelectasis of the bilateral lung bases. Respiratory motion somewhat limits  evaluation of the lungs for small nodules.  Abdomen/ pelvis:  Postsurgical changes of percutaneous drain placement into segment 6/right liver lobe abscess. Pigtail remains positioned within low-density region in the area of the prior abscess. Focal hypodensity in this region measures approximately 5.0- 5.5 cm. The overall size does appear smaller than the prior MRI, though comparison across modalities is somewhat limited by the tissue resolution on the CT.  Remainder of the liver parenchyma unremarkable.  Unremarkable appearance of the spleen. Unremarkable appearance of bilateral adrenal glands.  No evidence of hydronephrosis. Bilateral low-density cystic lesions of the kidneys, characterized on prior MRI to represent benign cysts.  Enteric contrast extends through the distal small bowel and colon. No evidence of abnormally distended small bowel or colon.  Circumferential soft tissue within the ascending colon with "apple core" configuration, as was described on prior MRI.  There are a few small mesenteric lymph nodes, none of which are enlarged by CT size criteria. Specifically, ileocolic lymph node measures 7 mm with rounded configuration on image 35 of series 2.  Unremarkable appearance of the urinary bladder. Transverse diameter of the prostate measures 5.4 cm.  Small amount of low-density free fluid layered within the rectovesical space.  Atherosclerotic calcifications of the abdominal aorta extending into the iliofemoral system.  Multilevel degenerative disc disease of the lower thoracic and lumbar spine. No bony canal stenosis. No acute displaced fracture.  IMPRESSION: Interval placement of percutaneous pigtail drainage collapse there into segment 6 liver fluid collection compared to prior MRI. Across modalities, there does appear to be decreased size of  this collection, though there is likely some persisting fluid/ edema.  Re- demonstration of circumferential soft tissue within the ascending colon,  concerning for colon carcinoma. There is a suspicious, rounded lymph node within the ileocolic nodal station, though is not enlarged by CT size criteria.  Atherosclerosis with evidence of left main in 3 vessel coronary artery disease.  Moderate right-sided pleural effusion with atelectasis.  Signed,  Dulcy Fanny. Earleen Newport, DO  [facility]  [facility]   Electronically Signed   By: Corrie Mckusick D.O.   On: 02/25/2014 19:20   Mr Brain Wo Contrast  02/28/2014   CLINICAL DATA:  Initial evaluation for acute confusion.  EXAM: MRI HEAD WITHOUT CONTRAST  TECHNIQUE: Multiplanar, multiecho pulse sequences of the brain and surrounding structures were obtained without intravenous contrast.  COMPARISON:  Prior CT from 02/07/2014  FINDINGS: Mild diffuse prominence of the CSF containing spaces is compatible with generalized cerebral atrophy. Minimal patchy T2/FLAIR hyperintensity present within the periventricular and deep white matter both cerebral hemispheres, likely related to very mild chronic microvascular ischemic changes.  No mass lesion, midline shift, or extra-axial fluid collection. Ventricles are normal in size without evidence of hydrocephalus.  No diffusion-weighted signal abnormality is identified to suggest acute intracranial infarct. Gray-white matter differentiation is maintained. Normal flow voids are seen within the intracranial vasculature. No intracranial hemorrhage identified.  The cervicomedullary junction is normal. Pituitary gland is within normal limits. Pituitary stalk is midline. The globes and optic nerves demonstrate a normal appearance with normal signal intensity. The  The bone marrow signal intensity is normal. Calvarium is intact. Visualized upper cervical spine is within normal limits.  Scalp soft tissues are unremarkable.  Paranasal sinuses are clear.  No mastoid effusion.  IMPRESSION: 1. No acute intracranial infarct or other abnormality identified.  2. Mild age-related atrophy with chronic microvascular ischemic disease.   Electronically Signed   By: Jeannine Boga M.D.   On: 02/28/2014 05:02    Labs:  CBC:  Recent Labs  02/17/14 0026 02/19/14 0550 02/21/14 1100 02/28/14 0854  WBC 16.5* 14.0* 10.5 8.8  HGB 9.4* 8.6* 8.9* 8.4*  HCT 27.9* 25.6* 26.6* 25.2*  PLT 348 395 383 254    COAGS:  Recent Labs  02/15/14 1243 02/15/14 1539 02/19/14 0550  INR 1.24 1.18 1.41  APTT 35  --  46*    BMP:  Recent Labs  02/25/14 0425 02/26/14 0920 02/27/14 0404 02/28/14 0854  NA 140 139 140 139  K 3.9 4.0 4.0 3.9  CL 106 105 107 102  CO2 19 17* 16* 24  GLUCOSE 98 105* 83 115*  BUN 25* 28* 32* 32*  CALCIUM 8.6 8.3* 8.3* 8.1*  CREATININE 3.50* 3.92* 4.11* 3.91*  GFRNONAA 16* 14* 13* 14*  GFRAA 18* 16* 15* 16*    LIVER FUNCTION TESTS:  Recent Labs  02/19/14 0550 02/21/14 1100 02/23/14 1929 02/27/14 0404 02/28/14 0854  BILITOT 1.3* 1.1 0.8 0.5  --   AST 30 53* 45* 33  --   ALT 19 33 39 31  --   ALKPHOS 164* 209* 233* 166*  --   PROT 6.0 6.4 6.4 6.1  --   ALBUMIN 2.0* 2.0* 2.1* 2.1*  2.1* 1.9*    Assessment and Plan: Liver abscess s/p perc drain placed in IR 10/13 Cx no growth, minimal output serous, afebrile > 48 hours, wbc wnl, symptoms significantly improved, off of antibiotics.  Reviewed images with Dr. Laurence Ferrari and given improvement in clinical status perc  drain removed today, no immediate complications and dressing placed.     SignedHedy Jacob 02/28/2014, 2:25 PM

## 2014-02-28 NOTE — Progress Notes (Signed)
Physical Therapy Treatment Patient Details Name: Brent Little MRN: [id number] DOB: [birth year] Today's Date: 02/28/2014    History of Present Illness Pt admitted with fever chills, decreased appetite and lethargy.  Liver mass noted on CT and diagnosed as an abcess after biopsy.    PT Comments    Patient is progressing towards physical therapy goals, however with wife present she reports he has been quite agitated and confused this afternoon - currently disoriented to time, place, and situation with poor short term memory. Pt able to tolerate extended ambulatory distances however he required mod assist on two occurences today due to loss of balance towards end of therapy session. Anticipate pt will need short term rehab at SNF, as wife would not be able to provide adequate/safe care for him at this time. Patient will continue to benefit from skilled physical therapy services to further improve independence with functional mobility.    Follow Up Recommendations  Supervision/Assistance - 24 hour;SNF     Equipment Recommendations  None recommended by PT    Recommendations for Other Services       Precautions / Restrictions Precautions Precautions: Fall Restrictions Weight Bearing Restrictions: No    Mobility  Bed Mobility Overal bed mobility: Needs Assistance Bed Mobility: Supine to Sit;Sit to Supine     Supine to sit: Supervision;HOB elevated Sit to supine: Supervision   General bed mobility comments: Supervision for safety. VC for technique. no physical assist.  Transfers Overall transfer level: Needs assistance Equipment used: Rolling walker (2 wheeled) Transfers: Sit to/from Stand Sit to Stand: Min guard         General transfer comment: min guard for safety with posterior lean initially using back of knees for support on bed however able to self correct and regain balance with use of RW.  Ambulation/Gait Ambulation/Gait assistance: Mod assist Ambulation Distance  (Feet): 225 Feet Assistive device: Rolling walker (2 wheeled) Gait Pattern/deviations: Step-through pattern;Narrow base of support;Staggering right Gait velocity: decreased   General Gait Details: Pt ambulated majority of bout at min guard level until end of distance when he turned towards his right side and lost balance heavily requiring mod assist to correct. Pt states he lost his concentration. While approaching bed pt again lost his balance and tipped over towards his right side again requiring mod assist to correct balance. Encouraged increased use of UEs through rolling walker for balance and stability. Denies any dizziness.   Stairs            Wheelchair Mobility    Modified Rankin (Stroke Patients Only)       Balance                                    Cognition Arousal/Alertness: Awake/alert Behavior During Therapy: Impulsive;Agitated Overall Cognitive Status: Impaired/Different from baseline Area of Impairment: Orientation;Memory;Following commands;Safety/judgement;Awareness Orientation Level: Disoriented to;Place;Time;Situation   Memory: Decreased short-term memory;Decreased recall of precautions Following Commands: Follows multi-step commands consistently Safety/Judgement: Decreased awareness of safety;Decreased awareness of deficits Awareness: Intellectual;Anticipatory   General Comments: Per wife, pt very different from baseline- Very poor short-term memory, and easily agitated. Disoriented to place time and situation.    Exercises      General Comments General comments (skin integrity, edema, etc.): SpO2  maintained 96% on room air throughout therapy session. HR 78 BPM at rest elevated to 114 during ambulation.      Pertinent Vitals/Pain Pain Assessment: No/denies pain  Home Living                      Prior Function            PT Goals (current goals can now be found in the care plan section) Progress towards PT goals:  Progressing toward goals    Frequency  Min 3X/week    PT Plan Current plan remains appropriate    Co-evaluation             End of Session Equipment Utilized During Treatment: Gait belt Activity Tolerance: Patient tolerated treatment well Patient left: with family/visitor present;with call bell/phone within reach;in bed     Time: 6153-7943 PT Time Calculation (min): 19 min  Charges:  $Gait Training: 8-22 mins                    G Codes:      Elayne Snare, [place]  Ellouise Newer 02/28/2014, 4:35 PM

## 2014-02-28 NOTE — Progress Notes (Signed)
[facility] PROGRESS NOTE  Keean Wilmeth SKA:[id number] DOB: 17-Oct-1940 DOA: 02/15/2014 PCP: [name], MD  Brief Narrative: Ceaser Ebeling is a 73 y.o. male, With H/O HTN, Kidney stone, who initially presented to [place] with fever, dull right upper quadrant pain and a dry cough. He was diagnosed there with a possible community-acquired pneumonia, was placed on Levaquin, he had marginal improvement. During his fever workup he had a right upper quadrant ultrasound which showed some nonspecific changes . He was then discharged home with outpatient MRI. MRI done in the outpatient setting showed a septated cystic lesion in the right hepatic lobe concerning for abscess versus metastatic necrotic mass. MRI also showed an ascending colon circumferential mass. He presented to [facility] with fever, chills, decreased appetite and lethargy. He was followed by CCS/GI and IR. He has been on broad spectrum Abx, finally underwent Liver biopsy 10/12. And had a drain put it. His creatinine started creeping up on 10/16. Vancomycin trough came back elevated. Stopped vancomycin and Nephrology was consulted. His blood cultures and cultures from the liver abscess have all come back negative and zosyn was discontinued on 10/19 after completing 10 days of IV antibiotics. Now with rectal bleeding. GI to be reconsulted.  Assessment/Plan: Liver abscess vs necrotic mets; He underwent US guided biopsy 10/12; yielded purulent appearing material. 21F drain was placed under US guidance. Culture and gram stain were sent. No organisms seen. Blood cultures are  negative. He has completed 10 days of IV antibiotics. Vancomycin was stopped on 10/16 after the trough came back elevated. A repeat CT abdomen and pelvis without contrast showed decrease in the size of the collection. IR to take the drain out in the next few days when output has decreased.  Colon mass in the ascending colon/Hematochezia He has never had colonoscopy. GI  consulted for colonoscopy. They recommended outpatient colonoscopy when he recovers from the abscess. However now he has rectal bleeding. Might need to address this sooner than later. Will discuss with GI. CEA mildly elevated at 9.   Acute renal failure Probably secondary to vancomycin toxicity vs ace inhibition vs NSAID use vs volume depletion. [place] was negative for hydronephrosis. UA is negative. Vancomycin trough is 47.3. Nephrology following. HCO3 infusion.   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  Dysuria UA benign  Confusion/Acute Encephalopathy Initially thought to be secondary to narcotic pain medications. These were stopped. MRI brian was done due to persistent symptoms. MRI was negative. Stable.  Sinus Bradycardia Asymptomatic.  DVT proph: SCD's Code Status: Full Code Family Communication: wife at bedside Disposition Plan: SNF when stable   Consultants:  GI  CCS  IR  Subjective: Wife mentions small quantity stool with red appearance. RN confirmed that he has been having bleeding per rectum. No abdominal pain.  Objective: Filed Vitals:   02/28/14 1003  BP: 140/68  Pulse:   Temp:   Resp:     Intake/Output Summary (Last 24 hours) at 02/28/14 1107 Last data filed at 02/28/14 1052  Gross per 24 hour  Intake    920 ml  Output    910 ml  Net     10 ml   Filed Weights   02/26/14 0500 02/27/14 0431 02/28/14 0300  Weight: 77.9 kg (171 lb 11.8 oz) 78 kg (171 lb 15.3 oz) 81.6 kg (179 lb 14.3 oz)    Exam:   General:  alert and conversant, but confused..   Cardiovascular: S1S2/RRR  Respiratory: CTAB  Abdomen: soft, NO tenderness in RUQ, BS present, drain noted  Musculoskeletal: no edema  Data Reviewed: Basic Metabolic Panel:  Recent Labs Lab 02/24/14 0510 02/25/14 0425 02/26/14 0920 02/27/14 0404 02/28/14 0854  NA 140 140 139 140 139  K 4.0 3.9 4.0 4.0 3.9  CL 105 106 105 107 102  CO2 19 19  17* 16* 24  GLUCOSE 101* 98 105* 83 115*  BUN 23 25* 28* 32* 32*  CREATININE 2.80* 3.50* 3.92* 4.11* 3.91*  CALCIUM 8.9 8.6 8.3* 8.3* 8.1*  PHOS  --   --   --  4.0 3.5   Liver Function Tests:  Recent Labs Lab 02/23/14 1929 02/27/14 0404 02/28/14 0854  AST 45* 33  --   ALT 39 31  --   ALKPHOS 233* 166*  --   BILITOT 0.8 0.5  --   PROT 6.4 6.1  --   ALBUMIN 2.1* 2.1*  2.1* 1.9*   CBC:  Recent Labs Lab 02/28/14 0854  WBC 8.8  HGB 8.4*  HCT 25.2*  MCV 79.5  PLT 254   Cardiac Enzymes:  Recent Labs Lab 02/25/14 1537  CKTOTAL 22     Recent Results (from the past 240 hour(s))  GRAM STAIN     Status: None   Collection Time    02/19/14 12:17 PM      Result Value Ref Range Status   Specimen Description ABSCESS RIGHT LIVER   Final   Special Requests Normal   Final   Gram Stain     Final   Value: ABUNDANT WBC PRESENT,BOTH PMN AND MONONUCLEAR     NO ORGANISMS SEEN   Report Status 02/19/2014 FINAL   Final  CULTURE, ROUTINE-ABSCESS     Status: None   Collection Time    02/19/14 12:17 PM      Result Value Ref Range Status   Specimen Description ABSCESS RIGHT LIVER   Final   Special Requests NONE   Final   Gram Stain     Final   Value: ABUNDANT WBC PRESENT,BOTH PMN AND MONONUCLEAR     NO SQUAMOUS EPITHELIAL CELLS SEEN     NO ORGANISMS SEEN     Performed at [facility]     Performed at [facility]   Culture     Final   Value: NO GROWTH 3 DAYS     Performed at [facility]   Report Status 02/22/2014 FINAL   Final  URINE CULTURE     Status: None   Collection Time    02/20/14 12:45 AM      Result Value Ref Range Status   Specimen Description URINE, CLEAN CATCH   Final   Special Requests NONE   Final   Culture  Setup Time     Final   Value: 02/20/2014 05:06     Performed at [place]     Final   Value: NO GROWTH     Performed at [facility]   Culture     Final   Value: NO GROWTH     Performed at  [facility]   Report Status 02/21/2014 FINAL   Final  CLOSTRIDIUM DIFFICILE BY PCR     Status: None   Collection Time    02/25/14  2:17 PM      Result Value Ref Range Status   C difficile by pcr NEGATIVE  NEGATIVE Final     Studies: Mr  Brain Wo Contrast  02/28/2014   CLINICAL DATA:  Initial evaluation for acute confusion.  EXAM: MRI HEAD WITHOUT CONTRAST  TECHNIQUE: Multiplanar, multiecho pulse sequences of the brain and surrounding structures were obtained without intravenous contrast.  COMPARISON:  Prior CT from 02/07/2014  FINDINGS: Mild diffuse prominence of the CSF containing spaces is compatible with generalized cerebral atrophy. Minimal patchy T2/FLAIR hyperintensity present within the periventricular and deep white matter both cerebral hemispheres, likely related to very mild chronic microvascular ischemic changes.  No mass lesion, midline shift, or extra-axial fluid collection. Ventricles are normal in size without evidence of hydrocephalus.  No diffusion-weighted signal abnormality is identified to suggest acute intracranial infarct. Gray-white matter differentiation is maintained. Normal flow voids are seen within the intracranial vasculature. No intracranial hemorrhage identified.  The cervicomedullary junction is normal. Pituitary gland is within normal limits. Pituitary stalk is midline. The globes and optic nerves demonstrate a normal appearance with normal signal intensity. The  The bone marrow signal intensity is normal. Calvarium is intact. Visualized upper cervical spine is within normal limits.  Scalp soft tissues are unremarkable.  Paranasal sinuses are clear.  No mastoid effusion.  IMPRESSION: 1. No acute intracranial infarct or other abnormality identified. 2. Mild age-related atrophy with chronic microvascular ischemic disease.   Electronically Signed   By: Jeannine Boga M.D.   On: 02/28/2014 05:02    Scheduled Meds: . ferrous sulfate  325 mg Oral Q breakfast   . Influenza vac split quadrivalent PF  0.5 mL Intramuscular Tomorrow-1000  . pneumococcal 23 valent vaccine  0.5 mL Intramuscular Tomorrow-1000  . terazosin  5 mg Oral Daily   Continuous Infusions: .  sodium bicarbonate 150 mEq in sterile water 1000 mL infusion 100 mL/hr at 02/28/14 1056   Antibiotics Given (last 72 hours)   Date/Time Action Medication Dose Rate   02/25/14 1542 Given   piperacillin-tazobactam (ZOSYN) IVPB 3.375 g 3.375 g 12.5 mL/hr   02/26/14 0008 Given   piperacillin-tazobactam (ZOSYN) IVPB 3.375 g 3.375 g 12.5 mL/hr   02/26/14 0831 Given   piperacillin-tazobactam (ZOSYN) IVPB 3.375 g 3.375 g 12.5 mL/hr   02/26/14 1716 Given   piperacillin-tazobactam (ZOSYN) IVPB 2.25 g 2.25 g 100 mL/hr      Principal Problem:   Essential hypertension Active Problems:   Liver abscess   BPH (benign prostatic hyperplasia)   Colonic mass   Acute renal failure  Time spent: 48min   Justyn Boyson   [facility]  Pager [phone].   If 7PM-7AM, please contact night-coverage at [email or web address], password [facility] 02/28/2014, 11:07 AM  LOS: 13 days

## 2014-03-01 ENCOUNTER — Inpatient Hospital Stay (HOSPITAL_COMMUNITY): Payer: Medicare Other

## 2014-03-01 DIAGNOSIS — R110 Nausea: Secondary | ICD-10-CM

## 2014-03-01 LAB — CBC
HEMATOCRIT: 23.6 % — AB (ref 39.0–52.0)
HEMOGLOBIN: 7.8 g/dL — AB (ref 13.0–17.0)
MCH: 25.8 pg — AB (ref 26.0–34.0)
MCHC: 33.1 g/dL (ref 30.0–36.0)
MCV: 78.1 fL (ref 78.0–100.0)
Platelets: 224 10*3/uL (ref 150–400)
RBC: 3.02 MIL/uL — ABNORMAL LOW (ref 4.22–5.81)
RDW: 15.5 % (ref 11.5–15.5)
WBC: 8.4 10*3/uL (ref 4.0–10.5)

## 2014-03-01 LAB — HEPATIC FUNCTION PANEL
ALT: 18 U/L (ref 0–53)
AST: 22 U/L (ref 0–37)
Albumin: 1.9 g/dL — ABNORMAL LOW (ref 3.5–5.2)
Alkaline Phosphatase: 127 U/L — ABNORMAL HIGH (ref 39–117)
BILIRUBIN TOTAL: 0.4 mg/dL (ref 0.3–1.2)
Total Protein: 5.5 g/dL — ABNORMAL LOW (ref 6.0–8.3)

## 2014-03-01 LAB — CREATININE, URINE, RANDOM: CREATININE, URINE: 58.09 mg/dL

## 2014-03-01 LAB — RENAL FUNCTION PANEL
Albumin: 1.7 g/dL — ABNORMAL LOW (ref 3.5–5.2)
Anion gap: 13 (ref 5–15)
BUN: 31 mg/dL — ABNORMAL HIGH (ref 6–23)
CALCIUM: 7.9 mg/dL — AB (ref 8.4–10.5)
CO2: 29 mEq/L (ref 19–32)
CREATININE: 4.02 mg/dL — AB (ref 0.50–1.35)
Chloride: 100 mEq/L (ref 96–112)
GFR calc Af Amer: 16 mL/min — ABNORMAL LOW (ref >90)
GFR calc non Af Amer: 13 mL/min — ABNORMAL LOW (ref >90)
GLUCOSE: 95 mg/dL (ref 70–99)
PHOSPHORUS: 3.5 mg/dL (ref 2.3–4.6)
Potassium: 3.8 mEq/L (ref 3.7–5.3)
Sodium: 142 mEq/L (ref 137–147)

## 2014-03-01 LAB — SODIUM, URINE, RANDOM: Sodium, Ur: 37 mEq/L

## 2014-03-01 MED ORDER — PEG 3350-KCL-NA BICARB-NACL 420 G PO SOLR
4000.0000 mL | Freq: Once | ORAL | Status: AC
  Administered 2014-03-01: 4000 mL via ORAL
  Filled 2014-03-01: qty 4000

## 2014-03-01 MED ORDER — BISACODYL 5 MG PO TBEC
10.0000 mg | DELAYED_RELEASE_TABLET | Freq: Once | ORAL | Status: AC
  Administered 2014-03-01: 10 mg via ORAL

## 2014-03-01 MED ORDER — SODIUM CHLORIDE 0.9 % IV SOLN
INTRAVENOUS | Status: DC
  Administered 2014-03-01: 10 mL/h via INTRAVENOUS

## 2014-03-01 NOTE — Progress Notes (Signed)
Assessment/Plan:  1. AKI/CKD in setting of volume depletion/hypotension/Ace-inhibition/NSAIDs/vanc toxicity/hepatic abscess Will stop fluids and follow renal fct. 2. Liver abscess -- draining 3. Colon mass- Ascending colon, suspicious for cancer. Will stop PO iron in anticipation of colonoscopy 4. Severe protein malnutrition -per primary  Subjective: Interval History: Confused this morning.  Not sure of UOP as he is incontinent of urine  Objective: Vital signs in last 24 hours: Temp:  [98 F (36.7 C)-98.8 F (37.1 C)] 98 F (36.7 C) (10/22 0350) Pulse Rate:  [54-82] 66 (10/22 0350) Resp:  [17-18] 17 (10/22 0350) BP: (115-140)/(51-68) 133/51 mmHg (10/22 0350) SpO2:  [90 %-94 %] 90 % (10/22 0350) Weight:  [80.65 kg (177 lb 12.8 oz)] 80.65 kg (177 lb 12.8 oz) (10/22 0350) Weight change: -0.951 kg (-2 lb 1.5 oz)  Intake/Output from previous day: 10/21 0701 - 10/22 0700 In: 1040 [P.O.:240; I.V.:800] Out: 150 [Urine:150] Intake/Output this shift:    General appearance: some confusion Resp: clear to auscultation bilaterally Cardio: regular rate and rhythm, S1, S2 normal, no murmur, click, rub or gallop Extremities: edema 1+ edema  Lab Results:  Recent Labs  02/28/14 1937 03/01/14 0448  WBC 9.1 8.4  HGB 8.0* 7.8*  HCT 23.9* 23.6*  PLT 236 224   BMET:  Recent Labs  02/28/14 0854 03/01/14 0448  NA 139 142  K 3.9 3.8  CL 102 100  CO2 24 29  GLUCOSE 115* 95  BUN 32* 31*  CREATININE 3.91* 4.02*  CALCIUM 8.1* 7.9*   No results found for this basename: PTH,  in the last 72 hours Iron Studies:  Recent Labs  02/27/14 0404  IRON 14*  TIBC 163*   Studies/Results: Mr Brain Wo Contrast  02/28/2014   CLINICAL DATA:  Initial evaluation for acute confusion.  EXAM: MRI HEAD WITHOUT CONTRAST  TECHNIQUE: Multiplanar, multiecho pulse sequences of the brain and surrounding structures were obtained without intravenous contrast.  COMPARISON:  Prior CT from 02/07/2014   FINDINGS: Mild diffuse prominence of the CSF containing spaces is compatible with generalized cerebral atrophy. Minimal patchy T2/FLAIR hyperintensity present within the periventricular and deep white matter both cerebral hemispheres, likely related to very mild chronic microvascular ischemic changes.  No mass lesion, midline shift, or extra-axial fluid collection. Ventricles are normal in size without evidence of hydrocephalus.  No diffusion-weighted signal abnormality is identified to suggest acute intracranial infarct. Gray-white matter differentiation is maintained. Normal flow voids are seen within the intracranial vasculature. No intracranial hemorrhage identified.  The cervicomedullary junction is normal. Pituitary gland is within normal limits. Pituitary stalk is midline. The globes and optic nerves demonstrate a normal appearance with normal signal intensity. The  The bone marrow signal intensity is normal. Calvarium is intact. Visualized upper cervical spine is within normal limits.  Scalp soft tissues are unremarkable.  Paranasal sinuses are clear.  No mastoid effusion.  IMPRESSION: 1. No acute intracranial infarct or other abnormality identified. 2. Mild age-related atrophy with chronic microvascular ischemic disease.   Electronically Signed   By: Jeannine Boga M.D.   On: 02/28/2014 05:02   Scheduled: . ferrous sulfate  325 mg Oral Q breakfast  . Influenza vac split quadrivalent PF  0.5 mL Intramuscular Tomorrow-1000  . pneumococcal 23 valent vaccine  0.5 mL Intramuscular Tomorrow-1000  . terazosin  5 mg Oral Daily     LOS: 14 days   Demetreus Lothamer C 03/01/2014,8:19 AM

## 2014-03-01 NOTE — Progress Notes (Addendum)
[facility] PROGRESS NOTE  Brent Little VFI:[id number] DOB: Dec 10, 1940 DOA: 02/15/2014  PCP: [name], MD  Brief Narrative: Brent Little is a 73 y.o. male, With H/O HTN, Kidney stone, who initially presented to [place] with fever, dull right upper quadrant pain and a dry cough. He was diagnosed there with a possible community-acquired pneumonia, was placed on Levaquin, he had marginal improvement. During his fever workup he had a right upper quadrant ultrasound which showed some nonspecific changes . He was then discharged home with outpatient MRI. MRI done in the outpatient setting showed a septated cystic lesion in the right hepatic lobe concerning for abscess versus metastatic necrotic mass. MRI also showed an ascending colon circumferential mass. He presented to [name] with fever, chills, decreased appetite and lethargy. He was followed by CCS/GI and IR. He has been on broad spectrum Abx, finally underwent Liver biopsy 10/12. And had a drain put it. His creatinine started creeping up on 10/16. Vancomycin trough came back elevated. Stopped vancomycin and Nephrology was consulted. His blood cultures and cultures from the liver abscess have all come back negative and zosyn was discontinued on 10/19 after completing 10 days of IV antibiotics. Now with minimal rectal bleeding. GI reconsulted and plan is for colonoscopy 10/23.   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 73F drain was placed under US guidance. Culture and gram stain were sent. No organisms seen. Blood cultures are  negative. He has completed 10 days of IV antibiotics. Vancomycin was stopped on 10/16 after the trough came back elevated. A repeat CT abdomen and pelvis without contrast showed decrease in the size of the collection. Drain removed 10/21.   Colon mass in the ascending colon/Hematochezia He has never had colonoscopy. GI consulted  for colonoscopy. They recommended outpatient colonoscopy when he recovers from the abscess. However he developed mild rectal bleeding. Plan is for colonoscopy 10/23. CEA mildly elevated at 9.   Acute renal failure Probably secondary to vancomycin toxicity vs ace inhibition vs NSAID use vs volume depletion. [place] was negative for hydronephrosis. UA is negative. Vancomycin trough was 47.3. Nephrology following. Was given HCO3 infusion. Renal function is stable though not back to baseline.   Nausea/Poor Appetite Likely due to acute illness, ARF. There is history of gall stones. Mildly tender in RUQ which could be from recent procedure/liver lesion. Will get [place] RUQ. LFT's.  Essential HTN BP fluctuates but stable. ACE on hold  Iron Deficiency Anemia Hgb noted to be lower over last 2 days. No further bleeding noted. Monitor for now.   Dysuria UA benign  Confusion/Acute Encephalopathy Initially thought to be secondary to narcotic pain medications. These were stopped. MRI brian was done due to persistent symptoms. MRI was negative. Stable with occasional periods of confusion. Possibly due to hospital stay. Patient likely has underlying cognitive impairment.   Sinus Bradycardia Asymptomatic.  DVT proph: SCD's Code Status: Full Code Family Communication: wife at bedside Disposition Plan: SNF recommended by PT. Not ready for discharge.   Consultants:  GI  CCS  IR  Subjective: Wife mentions that patient had some periods of confusion. Patient denies any pain. Has been nauseated and has poor appetite. Wife mentions history of gallstones. Denies abdominal pain.  Objective: Filed Vitals:   03/01/14 1006  BP: 176/68  Pulse:   Temp:   Resp:     Intake/Output Summary (Last 24 hours) at 03/01/14 1033 Last data filed at 03/01/14 [phone]  Gross per 24 hour  Intake    920 ml  Output    150 ml  Net    770 ml   Filed Weights   02/27/14 0431 02/28/14 0300 03/01/14 0350  Weight: 78 kg (171  lb 15.3 oz) 81.6 kg (179 lb 14.3 oz) 80.65 kg (177 lb 12.8 oz)    Exam:   General:  alert and conversant, but confused..   Cardiovascular: S1S2/RRR  Respiratory: CTA bilaterally  Abdomen: soft, mild tenderness in RUQ, BS present. Drain removed.  Musculoskeletal: no edema  Data Reviewed: Basic Metabolic Panel:  Recent Labs Lab 02/25/14 0425 02/26/14 0920 02/27/14 0404 02/28/14 0854 03/01/14 0448  NA 140 139 140 139 142  K 3.9 4.0 4.0 3.9 3.8  CL 106 105 107 102 100  CO2 19 17* 16* 24 29  GLUCOSE 98 105* 83 115* 95  BUN 25* 28* 32* 32* 31*  CREATININE 3.50* 3.92* 4.11* 3.91* 4.02*  CALCIUM 8.6 8.3* 8.3* 8.1* 7.9*  PHOS  --   --  4.0 3.5 3.5   Liver Function Tests:  Recent Labs Lab 02/23/14 1929 02/27/14 0404 02/28/14 0854 03/01/14 0448  AST 45* 33  --   --   ALT 39 31  --   --   ALKPHOS 233* 166*  --   --   BILITOT 0.8 0.5  --   --   PROT 6.4 6.1  --   --   ALBUMIN 2.1* 2.1*  2.1* 1.9* 1.7*   CBC:  Recent Labs Lab 02/28/14 0854 02/28/14 1937 03/01/14 0448  WBC 8.8 9.1 8.4  HGB 8.4* 8.0* 7.8*  HCT 25.2* 23.9* 23.6*  MCV 79.5 76.8* 78.1  PLT 254 236 224   Cardiac Enzymes:  Recent Labs Lab 02/25/14 1537  CKTOTAL 22     Recent Results (from the past 240 hour(s))  GRAM STAIN     Status: None   Collection Time    02/19/14 12:17 PM      Result Value Ref Range Status   Specimen Description ABSCESS RIGHT LIVER   Final   Special Requests Normal   Final   Gram Stain     Final   Value: ABUNDANT WBC PRESENT,BOTH PMN AND MONONUCLEAR     NO ORGANISMS SEEN   Report Status 02/19/2014 FINAL   Final  CULTURE, ROUTINE-ABSCESS     Status: None   Collection Time    02/19/14 12:17 PM      Result Value Ref Range Status   Specimen Description ABSCESS RIGHT LIVER   Final   Special Requests NONE   Final   Gram Stain     Final   Value: ABUNDANT WBC PRESENT,BOTH PMN AND MONONUCLEAR     NO SQUAMOUS EPITHELIAL CELLS SEEN     NO ORGANISMS SEEN      Performed at [facility]     Performed at [facility]   Culture     Final   Value: NO GROWTH 3 DAYS     Performed at [facility]   Report Status 02/22/2014 FINAL   Final  URINE CULTURE     Status: None   Collection Time    02/20/14 12:45 AM      Result Value Ref Range Status   Specimen Description URINE, CLEAN CATCH   Final   Special Requests NONE   Final   Culture  Setup Time     Final   Value: 02/20/2014 05:06     Performed  at [place]     Final   Value: NO GROWTH     Performed at [facility]   Culture     Final   Value: NO GROWTH     Performed at [facility]   Report Status 02/21/2014 FINAL   Final  CLOSTRIDIUM DIFFICILE BY PCR     Status: None   Collection Time    02/25/14  2:17 PM      Result Value Ref Range Status   C difficile by pcr NEGATIVE  NEGATIVE Final     Studies: Mr Brain Wo Contrast  02/28/2014   CLINICAL DATA:  Initial evaluation for acute confusion.  EXAM: MRI HEAD WITHOUT CONTRAST  TECHNIQUE: Multiplanar, multiecho pulse sequences of the brain and surrounding structures were obtained without intravenous contrast.  COMPARISON:  Prior CT from 02/07/2014  FINDINGS: Mild diffuse prominence of the CSF containing spaces is compatible with generalized cerebral atrophy. Minimal patchy T2/FLAIR hyperintensity present within the periventricular and deep white matter both cerebral hemispheres, likely related to very mild chronic microvascular ischemic changes.  No mass lesion, midline shift, or extra-axial fluid collection. Ventricles are normal in size without evidence of hydrocephalus.  No diffusion-weighted signal abnormality is identified to suggest acute intracranial infarct. Gray-white matter differentiation is maintained. Normal flow voids are seen within the intracranial vasculature. No intracranial hemorrhage identified.  The cervicomedullary junction is normal. Pituitary gland is within normal  limits. Pituitary stalk is midline. The globes and optic nerves demonstrate a normal appearance with normal signal intensity. The  The bone marrow signal intensity is normal. Calvarium is intact. Visualized upper cervical spine is within normal limits.  Scalp soft tissues are unremarkable.  Paranasal sinuses are clear.  No mastoid effusion.  IMPRESSION: 1. No acute intracranial infarct or other abnormality identified. 2. Mild age-related atrophy with chronic microvascular ischemic disease.   Electronically Signed   By: Jeannine Boga M.D.   On: 02/28/2014 05:02    Scheduled Meds: . bisacodyl  10 mg Oral Once  . Influenza vac split quadrivalent PF  0.5 mL Intramuscular Tomorrow-1000  . pneumococcal 23 valent vaccine  0.5 mL Intramuscular Tomorrow-1000  . polyethylene glycol-electrolytes  4,000 mL Oral Once  . terazosin  5 mg Oral Daily   Continuous Infusions: . sodium chloride     Antibiotics Given (last 72 hours)   Date/Time Action Medication Dose Rate   02/26/14 1716 Given   piperacillin-tazobactam (ZOSYN) IVPB 2.25 g 2.25 g 100 mL/hr      Principal Problem:   Essential hypertension Active Problems:   Liver abscess   BPH (benign prostatic hyperplasia)   Colonic mass   Acute renal failure  Time spent: 70min   Paislynn Hegstrom   [facility]  Pager [phone].   If 7PM-7AM, please contact night-coverage at [email or web address], password [facility] 03/01/2014, 10:33 AM  LOS: 14 days

## 2014-03-01 NOTE — Progress Notes (Addendum)
Wife called and said that pt vomited and was not able to get to bathroom fast enough.  She said she wanted him to have colonoscopy as soon as possible because he could not tolerate any more prep.  I explained that prep was designed to empty out his bowels.  I did not want him to drink any more if it gave him nausea. Pt resting as he can not tolerate any more bowel movements at this time. I explained that he had an abdominal ultrasound ordered. Pt resting with call bell within reach.  Will continue to monitor. Payton Emerald, RN

## 2014-03-01 NOTE — Progress Notes (Signed)
Occupational Therapy Treatment Patient Details Name: Brent Little MRN: [id number] DOB: 1940/10/30 Today's Date: 03/01/2014    History of present illness Pt admitted with fever chills, decreased appetite and lethargy.  Liver mass noted on CT and diagnosed as an abcess after biopsy.  Scheduled for Colonoscopy tomorrow 10/23 however he vomited GoLYTELY this PM.   OT comments  Patient in bed upon arrival with wife at his bedside.  Prior to arrival, patient with incontinent BM in the bed and had just vomited the GoLTYELY solution he was taken to prepare for colonoscopy.  Patient alert, pleasant, and willing to participate in clean up then ambulated with RW and min guard around the bed to recliner.  Patient not oriented to date or time and required max-mod cues to use environmental cues.   Follow Up Recommendations  SNF;Supervision/Assistance - 24 hour    Equipment Recommendations  None recommended by OT    Precautions / Restrictions Precautions Precautions: Fall Restrictions Weight Bearing Restrictions: No       Mobility Bed Mobility Overal bed mobility: Needs Assistance Bed Mobility: Supine to Sit     Supine to sit: Supervision     General bed mobility comments: Supervision for safety. VC for technique. no physical assist.  Transfers Overall transfer level: Needs assistance Equipment used: Rolling walker (2 wheeled)   Sit to Stand: Min guard         General transfer comment: min guard for safety with posterior lean initially using back of knees for support on bed however able to self correct the ambulated to around the bed to the recliner    Balance   Sitting-balance support: No upper extremity supported;Feet supported Sitting balance-Leahy Scale: Good     Standing balance support: Bilateral upper extremity supported;During functional activity Standing balance-Leahy Scale: Poor Standing balance comment: using bed at back on knees to support in static standing        ADL Overall ADL's : Needs assistance/impaired     Grooming: Wash/dry hands;Wash/dry face;Set up;Sitting (verbal cues for sequencing, stood x 5 minutes)   Upper Body Bathing: Supervision/ safety;Cueing for sequencing;Sitting   Lower Body Bathing: Minimal assistance;Sit to/from stand;Cueing for compensatory techniques;Cueing for safety Lower Body Bathing Details (indicate cue type and reason): posterior lean duirng standing, additional cleanup required due to incontinent of BM     Lower Body Dressing:  (adult diaper and socks)   Toilet Transfer:  (over toilet)   Toileting- Clothing Manipulation and Hygiene:  (assisted for balance in standing as he wiped)       Functional mobility during ADLs: Minimal assistance;Rolling walker (+2 to manage IV) General ADL Comments: Difficulty sequencing grooming, impaired balance interfering with standing ADL.       Cognition     Overall Cognitive Status: Impaired/Different from baseline (per wife) Area of Impairment: Orientation;Memory;Following commands;Safety/judgement;Awareness Orientation Level: Disoriented to;Time;Situation Current Attention Level: Sustained Memory: Decreased short-term memory;Decreased recall of precautions  Following Commands: Follows one step commands with increased time Safety/Judgement: Decreased awareness of safety;Decreased awareness of deficits Awareness: Intellectual Problem Solving: Difficulty sequencing;Requires verbal cues General Comments: Per wife, pt very different from baseline- Very poor short-term memory. Disoriented to place time and situation. Required max cues to use environmental cues     Pertinent Vitals/ Pain       No report of pain   Frequency Min 2X/week     Progress Toward Goals  OT Goals(current goals can now be found in the care plan section)  Progress towards OT goals: Progressing toward  goals  Acute Rehab OT Goals Potential to Achieve Goals: Good  Plan Discharge plan remains  appropriate    End of Session Equipment Utilized During Treatment: Gait belt;Rolling walker   Activity Tolerance Patient tolerated treatment well   Patient Left in chair;with call bell/phone within reach;with nursing/sitter in room;with family/visitor present;with chair alarm set   Nurse Communication Mobility status        Time: 7035-0093 OT Time Calculation (min): 39 min  Charges: OT Treatments $Self Care/Home Management : 38-52 mins  [place], Jaheim Canino 03/01/2014, 3:33 PM

## 2014-03-01 NOTE — Progress Notes (Signed)
[facility] Progress Note  Subjective: Feels weak, nobloody stools reported  Objective: Vital signs in last 24 hours: Temp:  [98 F (36.7 C)-98.8 F (37.1 C)] 98 F (36.7 C) (10/22 0350) Pulse Rate:  [54-82] 66 (10/22 0350) Resp:  [17-18] 17 (10/22 0350) BP: (115-135)/(51-67) 133/51 mmHg (10/22 0350) SpO2:  [90 %-94 %] 90 % (10/22 0350) Weight:  [80.65 kg (177 lb 12.8 oz)] 80.65 kg (177 lb 12.8 oz) (10/22 0350) Weight change: -0.951 kg (-2 lb 1.5 oz)   DG:[id number]  Lab Results: Results for orders placed during the hospital encounter of 02/15/14 (from the past 24 hour(s))  CBC     Status: Abnormal   Collection Time    02/28/14  7:37 PM      Result Value Ref Range   WBC 9.1  4.0 - 10.5 K/uL   RBC 3.11 (*) 4.22 - 5.81 MIL/uL   Hemoglobin 8.0 (*) 13.0 - 17.0 g/dL   HCT 23.9 (*) 39.0 - 52.0 %   MCV 76.8 (*) 78.0 - 100.0 fL   MCH 25.7 (*) 26.0 - 34.0 pg   MCHC 33.5  30.0 - 36.0 g/dL   RDW 15.5  11.5 - 15.5 %   Platelets 236  150 - 400 K/uL  SODIUM, URINE, RANDOM     Status: None   Collection Time    03/01/14 12:36 AM      Result Value Ref Range   Sodium, Ur 37    CREATININE, URINE, RANDOM     Status: None   Collection Time    03/01/14 12:36 AM      Result Value Ref Range   Creatinine, Urine 58.09    RENAL FUNCTION PANEL     Status: Abnormal   Collection Time    03/01/14  4:48 AM      Result Value Ref Range   Sodium 142  137 - 147 mEq/L   Potassium 3.8  3.7 - 5.3 mEq/L   Chloride 100  96 - 112 mEq/L   CO2 29  19 - 32 mEq/L   Glucose, Bld 95  70 - 99 mg/dL   BUN 31 (*) 6 - 23 mg/dL   Creatinine, Ser 4.02 (*) 0.50 - 1.35 mg/dL   Calcium 7.9 (*) 8.4 - 10.5 mg/dL   Phosphorus 3.5  2.3 - 4.6 mg/dL   Albumin 1.7 (*) 3.5 - 5.2 g/dL   GFR calc non Af Amer 13 (*) >90 mL/min   GFR calc Af Amer 16 (*) >90 mL/min   Anion gap 13  5 - 15  CBC     Status: Abnormal   Collection Time    03/01/14  4:48 AM      Result Value Ref Range   WBC 8.4  4.0 - 10.5 K/uL    RBC 3.02 (*) 4.22 - 5.81 MIL/uL   Hemoglobin 7.8 (*) 13.0 - 17.0 g/dL   HCT 23.6 (*) 39.0 - 52.0 %   MCV 78.1  78.0 - 100.0 fL   MCH 25.8 (*) 26.0 - 34.0 pg   MCHC 33.1  30.0 - 36.0 g/dL   RDW 15.5  11.5 - 15.5 %   Platelets 224  150 - 400 K/uL    Studies/Results: Mr Brain Wo Contrast  02/28/2014   CLINICAL DATA:  Initial evaluation for acute confusion.  EXAM: MRI HEAD WITHOUT CONTRAST  TECHNIQUE: Multiplanar, multiecho pulse sequences of the brain and surrounding structures were obtained without intravenous contrast.  COMPARISON:  Prior CT from 02/07/2014  FINDINGS: Mild diffuse prominence of the CSF containing spaces is compatible with generalized cerebral atrophy. Minimal patchy T2/FLAIR hyperintensity present within the periventricular and deep white matter both cerebral hemispheres, likely related to very mild chronic microvascular ischemic changes.  No mass lesion, midline shift, or extra-axial fluid collection. Ventricles are normal in size without evidence of hydrocephalus.  No diffusion-weighted signal abnormality is identified to suggest acute intracranial infarct. Gray-white matter differentiation is maintained. Normal flow voids are seen within the intracranial vasculature. No intracranial hemorrhage identified.  The cervicomedullary junction is normal. Pituitary gland is within normal limits. Pituitary stalk is midline. The globes and optic nerves demonstrate a normal appearance with normal signal intensity. The  The bone marrow signal intensity is normal. Calvarium is intact. Visualized upper cervical spine is within normal limits.  Scalp soft tissues are unremarkable.  Paranasal sinuses are clear.  No mastoid effusion.  IMPRESSION: 1. No acute intracranial infarct or other abnormality identified. 2. Mild age-related atrophy with chronic microvascular ischemic disease.   Electronically Signed   By: Jeannine Boga M.D.   On: 02/28/2014 05:02      Assessment: 1. Hepatic  abscess 2. Ascending colon mass  Plan: Pt now willing to undergo colonoscopy here. Will schedule for 11 AM tomorrow    Shala Baumbach C 03/01/2014, 10:07 AM

## 2014-03-02 ENCOUNTER — Inpatient Hospital Stay (HOSPITAL_COMMUNITY): Payer: Medicare Other | Admitting: Certified Registered Nurse Anesthetist

## 2014-03-02 ENCOUNTER — Encounter (HOSPITAL_COMMUNITY): Payer: Medicare Other | Admitting: Certified Registered Nurse Anesthetist

## 2014-03-02 ENCOUNTER — Encounter (HOSPITAL_COMMUNITY): Payer: Self-pay | Admitting: *Deleted

## 2014-03-02 ENCOUNTER — Encounter (HOSPITAL_COMMUNITY)
Admission: EM | Disposition: A | Discharge status: Skilled Nursing Facility | Payer: Self-pay | Source: Home / Self Care | Attending: Internal Medicine

## 2014-03-02 DIAGNOSIS — D649 Anemia, unspecified: Secondary | ICD-10-CM

## 2014-03-02 HISTORY — PX: COLONOSCOPY WITH PROPOFOL: SHX5780

## 2014-03-02 LAB — HEPATIC FUNCTION PANEL
ALBUMIN: 2 g/dL — AB (ref 3.5–5.2)
ALT: 17 U/L (ref 0–53)
AST: 20 U/L (ref 0–37)
Alkaline Phosphatase: 125 U/L — ABNORMAL HIGH (ref 39–117)
Bilirubin, Direct: <0.2 mg/dL (ref 0.0–0.3)
Total Bilirubin: 0.4 mg/dL (ref 0.3–1.2)
Total Protein: 5.5 g/dL — ABNORMAL LOW (ref 6.0–8.3)

## 2014-03-02 LAB — RENAL FUNCTION PANEL
ALBUMIN: 2 g/dL — AB (ref 3.5–5.2)
Anion gap: 16 — ABNORMAL HIGH (ref 5–15)
BUN: 31 mg/dL — ABNORMAL HIGH (ref 6–23)
CO2: 27 meq/L (ref 19–32)
CREATININE: 4.12 mg/dL — AB (ref 0.50–1.35)
Calcium: 8.2 mg/dL — ABNORMAL LOW (ref 8.4–10.5)
Chloride: 98 mEq/L (ref 96–112)
GFR calc Af Amer: 15 mL/min — ABNORMAL LOW (ref >90)
GFR calc non Af Amer: 13 mL/min — ABNORMAL LOW (ref >90)
Glucose, Bld: 82 mg/dL (ref 70–99)
Phosphorus: 4.9 mg/dL — ABNORMAL HIGH (ref 2.3–4.6)
Potassium: 3.4 mEq/L — ABNORMAL LOW (ref 3.7–5.3)
Sodium: 141 mEq/L (ref 137–147)

## 2014-03-02 LAB — CBC
HCT: 26.1 % — ABNORMAL LOW (ref 39.0–52.0)
Hemoglobin: 8.2 g/dL — ABNORMAL LOW (ref 13.0–17.0)
MCH: 24.9 pg — ABNORMAL LOW (ref 26.0–34.0)
MCHC: 31.4 g/dL (ref 30.0–36.0)
MCV: 79.3 fL (ref 78.0–100.0)
Platelets: 251 10*3/uL (ref 150–400)
RBC: 3.29 MIL/uL — ABNORMAL LOW (ref 4.22–5.81)
RDW: 15.7 % — AB (ref 11.5–15.5)
WBC: 9.1 10*3/uL (ref 4.0–10.5)

## 2014-03-02 SURGERY — COLONOSCOPY WITH PROPOFOL
Anesthesia: Monitor Anesthesia Care

## 2014-03-02 MED ORDER — PROPOFOL INFUSION 10 MG/ML OPTIME
INTRAVENOUS | Status: DC | PRN
  Administered 2014-03-02: 100 ug/kg/min via INTRAVENOUS

## 2014-03-02 MED ORDER — FENTANYL CITRATE 0.05 MG/ML IJ SOLN: 25.0000 ug | INTRAMUSCULAR | Status: DC | PRN

## 2014-03-02 MED ORDER — SPOT INK MARKER SYRINGE KIT
PACK | SUBMUCOSAL | Status: AC
  Filled 2014-03-02: qty 5

## 2014-03-02 MED ORDER — LACTATED RINGERS IV SOLN
INTRAVENOUS | Status: DC
Start: 2014-03-02 — End: 2014-03-02
  Administered 2014-03-02: 1000 mL via INTRAVENOUS

## 2014-03-02 MED ORDER — LIDOCAINE HCL (CARDIAC) 20 MG/ML IV SOLN
INTRAVENOUS | Status: DC | PRN
  Administered 2014-03-02: 60 mg via INTRAVENOUS

## 2014-03-02 MED ORDER — LACTATED RINGERS IV SOLN
INTRAVENOUS | Status: DC | PRN
  Administered 2014-03-02: 11:00:00 via INTRAVENOUS

## 2014-03-02 MED ORDER — SPOT INK MARKER SYRINGE KIT
PACK | SUBMUCOSAL | Status: DC | PRN
  Administered 2014-03-02: 1.5 mL via SUBMUCOSAL

## 2014-03-02 MED ORDER — PROPOFOL 10 MG/ML IV BOLUS
INTRAVENOUS | Status: DC | PRN
  Administered 2014-03-02: 30 mg via INTRAVENOUS
  Administered 2014-03-02 (×4): 20 mg via INTRAVENOUS

## 2014-03-02 NOTE — Progress Notes (Signed)
[facility] PROGRESS NOTE  Draper Gallon DTO:[id number] DOB: [birth year] DOA: 02/15/2014  PCP: [name], MD  Brief Narrative: Brent Little is a 73 y.o. male, With H/O HTN, Kidney stone, who initially presented to [place] with fever, dull right upper quadrant pain and a dry cough. He was diagnosed there with a possible community-acquired pneumonia, was placed on Levaquin, he had marginal improvement. During his fever workup he had a right upper quadrant ultrasound which showed some nonspecific changes . He was then discharged home with outpatient MRI. MRI done in the outpatient setting showed a septated cystic lesion in the right hepatic lobe concerning for abscess versus metastatic necrotic mass. MRI also showed an ascending colon circumferential mass. He presented to [facility] with fever, chills, decreased appetite and lethargy. He was followed by [facility]/GI and IR. He has been on broad spectrum Abx, finally underwent Liver biopsy 10/12. And had a drain put it. His creatinine started creeping up on 10/16. Vancomycin trough came back elevated. Stopped vancomycin and Nephrology was consulted. His blood cultures and cultures from the liver abscess have all come back negative and zosyn was discontinued on 10/19 after completing 10 days of IV antibiotics. Now with minimal rectal bleeding. GI reconsulted and plan is for colonoscopy 10/23.   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 52F drain was placed under US guidance. Culture and gram stain were sent. No organisms seen. Blood cultures are  negative. He has completed 10 days of IV antibiotics. Vancomycin was stopped on 10/16 after the trough came back elevated. A repeat CT abdomen and pelvis without contrast showed decrease in the size of the collection. Drain removed 10/21.   Colon mass in the ascending colon/Hematochezia GI consulted for colonoscopy. They initially  recommended outpatient colonoscopy when he recovers from the abscess. However he developed mild rectal bleeding. Plan is for colonoscopy 10/23. CEA mildly elevated at 9. No further blood in stool.  Acute renal failure Probably secondary to vancomycin toxicity vs ace inhibition vs NSAID use vs volume depletion. [place] was negative for hydronephrosis. UA is negative. Vancomycin trough was 47.3. Nephrology following. Was given HCO3 infusion. Renal function is stable though not back to baseline.   Nausea/Poor Appetite Likely due to acute illness, ARF. US shows gallstones but no acute inflammation. LFT's normal. Continue to monitor.  Essential HTN BP fluctuates but stable. ACE on hold  Iron Deficiency Anemia Hgb noted to be lower over last 2 days but stable. No further bleeding noted. Monitor for now.   Dysuria UA benign  Confusion/Acute Encephalopathy Initially thought to be secondary to narcotic pain medications. These were stopped. MRI brian was done due to persistent symptoms. MRI was negative. Stable with occasional periods of confusion. Possibly due to hospital stay. Patient likely has underlying cognitive impairment.   Sinus Bradycardia Asymptomatic.  DVT proph: SCD's Code Status: Full Code Family Communication: Discussed with patient and wife at bedside Disposition Plan: SNF recommended by PT. Not ready for discharge.   Consultants:  GI  CCS  IR  Subjective: Patient slightly confused. Denies any pain.   Objective: Filed Vitals:   03/02/14 0515  BP: 138/47  Pulse: 67  Temp: 98.3 F (36.8 C)  Resp: 17    Intake/Output Summary (Last 24 hours) at 03/02/14 1003 Last data filed at 03/01/14 1700  Gross per 24 hour  Intake      0 ml  Output      0 ml  Net      0 ml   Filed Weights   02/28/14 0300 03/01/14 0350 Mar 25, 2014 0515  Weight: 81.6 kg (179 lb 14.3 oz) 80.65 kg (177 lb 12.8 oz) 79.289 kg (174 lb 12.8 oz)    Exam:   General:  alert and conversant, but  confused..   Cardiovascular: S1S2/RRR  Respiratory: CTA bilaterally  Abdomen: soft, mild tenderness in RUQ, BS present. Drain removed.  Musculoskeletal: no edema  Data Reviewed: Basic Metabolic Panel:  Recent Labs Lab 02/26/14 0920 02/27/14 0404 02/28/14 0854 03/01/14 0448 25-Mar-2014 0345  NA 139 140 139 142 141  K 4.0 4.0 3.9 3.8 3.4*  CL 105 107 102 100 98  CO2 17* 16* 24 29 27   GLUCOSE 105* 83 115* 95 82  BUN 28* 32* 32* 31* 31*  CREATININE 3.92* 4.11* 3.91* 4.02* 4.12*  CALCIUM 8.3* 8.3* 8.1* 7.9* 8.2*  PHOS  --  4.0 3.5 3.5 4.9*   Liver Function Tests:  Recent Labs Lab 02/23/14 1929 02/27/14 0404 02/28/14 0854 03/01/14 0448 03/01/14 2000 03-25-2014 0345  AST 45* 33  --   --  22 20  ALT 39 31  --   --  18 17  ALKPHOS 233* 166*  --   --  127* 125*  BILITOT 0.8 0.5  --   --  0.4 0.4  PROT 6.4 6.1  --   --  5.5* 5.5*  ALBUMIN 2.1* 2.1*  2.1* 1.9* 1.7* 1.9* 2.0*  2.0*   CBC:  Recent Labs Lab 02/28/14 0854 02/28/14 1937 03/01/14 0448 Mar 25, 2014 0345  WBC 8.8 9.1 8.4 9.1  HGB 8.4* 8.0* 7.8* 8.2*  HCT 25.2* 23.9* 23.6* 26.1*  MCV 79.5 76.8* 78.1 79.3  PLT 254 236 224 251   Cardiac Enzymes:  Recent Labs Lab 02/25/14 1537  CKTOTAL 22     Recent Results (from the past 240 hour(s))  CLOSTRIDIUM DIFFICILE BY PCR     Status: None   Collection Time    02/25/14  2:17 PM      Result Value Ref Range Status   C difficile by pcr NEGATIVE  NEGATIVE Final     Studies: US Abdomen Limited Ruq  Mar 25, 2014   CLINICAL DATA:  73 year old male with acute nausea. Current history of right hepatic lobe abscess drainage. Initial encounter.  EXAM: US ABDOMEN LIMITED - RIGHT UPPER QUADRANT  COMPARISON:  CT Abdomen and Pelvis 02/25/2014.  FINDINGS: Gallbladder:  Two shadowing echogenic gallstones identified, the largest is 9 mm in diameter (image 19). Wall thickness remains normal at 2 mm. No sonographic Murphy sign elicited.  Common bile duct:  Diameter: 3 mm, normal   Liver:  Heterogeneous echotexture in the right lobe, the site of the right pigtail drainage catheter. No measurable liver lesion. No intrahepatic biliary ductal dilatation.  Other findings:  Right pleural effusion.  IMPRESSION: 1. Heterogeneous echotexture in the right lobe at the site of recent drainage catheter. No measurable liver lesion. 2. Right pleural effusion. 3. Cholelithiasis without sonographic evidence of acute cholecystitis.   Electronically Signed   By: Lars Pinks M.D.   On: 03-25-14 00:26    Scheduled Meds: . Influenza vac split quadrivalent PF  0.5 mL Intramuscular Tomorrow-1000  . pneumococcal 23 valent vaccine  0.5 mL Intramuscular Tomorrow-1000  . terazosin  5 mg Oral Daily   Continuous Infusions: . sodium chloride 10 mL/hr (03/01/14 1233)   Antibiotics Given (last 72 hours)   None      Principal Problem:  Essential hypertension Active Problems:   Liver abscess   BPH (benign prostatic hyperplasia)   Colonic mass   Acute renal failure  Time spent: 68min   Lindora Alviar   [facility]  Pager [phone].   If 7PM-7AM, please contact night-coverage at [email or web address], password [facility] 03/02/2014, 10:03 AM  LOS: 15 days

## 2014-03-02 NOTE — Anesthesia Preprocedure Evaluation (Addendum)
Anesthesia Evaluation  Patient identified by MRN, date of birth, ID band Patient awake    Reviewed: Allergy & Precautions, H&P , NPO status , Patient's Chart, lab work & pertinent test results  History of Anesthesia Complications (+) MALIGNANT HYPERTHERMIA  Airway Mallampati: II TM Distance: >3 FB Neck ROM: Full    Dental  (+) Dental Advisory Given, Edentulous Upper, Missing, Poor Dentition, Partial Lower   Pulmonary former smoker,          Cardiovascular hypertension, Pt. on medications Rhythm:Regular Rate:Normal     Neuro/Psych    GI/Hepatic Neg liver ROS, GI history noted. CE   Endo/Other    Renal/GU ARFRenal disease     Musculoskeletal   Abdominal   Peds  Hematology   Anesthesia Other Findings   Reproductive/Obstetrics                         Anesthesia Physical Anesthesia Plan  ASA: II  Anesthesia Plan: MAC   Post-op Pain Management:    Induction: Intravenous  Airway Management Planned: Natural Airway and Simple Face Mask  Additional Equipment:   Intra-op Plan:   Post-operative Plan:   Informed Consent: I have reviewed the patients History and Physical, chart, labs and discussed the procedure including the risks, benefits and alternatives for the proposed anesthesia with the patient or authorized representative who has indicated his/her understanding and acceptance.   Dental advisory given  Plan Discussed with: CRNA, Anesthesiologist and Surgeon  Anesthesia Plan Comments:         Anesthesia Quick Evaluation

## 2014-03-02 NOTE — Clinical Social Work Note (Signed)
Spoke to patient's family and patient about SNF placement, informed patient and his family that he did receive bed offer at [facility], and left message for [place] to contact regarding patient, and Dorothyann Peng.  Jones Broom. [place], MSW, [place] 03/02/2014 6:58 PM

## 2014-03-02 NOTE — Op Note (Signed)
[facility] [place], [id number]   COLONOSCOPY PROCEDURE REPORT     EXAM DATE: 2014/03/13  PATIENT NAME:      Brent Little, Brent Little           MR #:      [id number] BIRTHDATE:       1940-09-16      VISIT #:     [phone]  ATTENDING:     Teena Irani, MD     STATUS:     inpatient REFERRING MD: ASA CLASS:  INDICATIONS:  The patient is a 73 yr old male here for a colonoscopy due to PROCEDURE PERFORMED:     colonoscopy with biopsy MEDICATIONS:     MAC ESTIMATED BLOOD LOSS:     None  CONSENT: The patient understands the risks and benefits of the procedure and understands that these risks include, but are not limited to: sedation, allergic reaction, infection, perforation and/or bleeding. Alternative means of evaluation and treatment include, among others: physical exam, x-rays, and/or surgical intervention. The patient elects to proceed with this endoscopic procedure.  DESCRIPTION OF PROCEDURE: During intra-op preparation period all mechanical & medical equipment was checked for proper function. Hand hygiene and appropriate measures for infection prevention was taken. After the risks, benefits and alternatives of the procedure were thoroughly explained, Informed consent was verified, confirmed and timeout was successfully executed by the treatment team. A digital exam The Pentax Ped Colon [id number] endoscope was introduced through the anus and advanced to the proximal ascending colon     . No adverse events experienced. The prep was good     . The instrument was then slowly withdrawn as the colon was fully examined. with the scope fully inserted we encountered a large circumferential a descending colon mass. This precluded advancement beyond that I could not see the cecal landmarks or ileocecal valve. Biopsies were taken.  There is a second semicircumferential mass at 60 cm from the anus estimated to be in the descending colon. This was hard  and malignant in appearance. Biopsies were taken and sent in a separate container. This lesion was tattooed with [place] ink.  the remainder of the colon was normal down to the rectum including retroflexion The scope was then completely withdrawn from the patient and the procedure terminated. WITHDRAWAL TIME:    ADVERSE EVENTS:      There were no immediate complications.  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  The verification of the translation of this report to the ICD and CPT codes and modifiers is the sole responsibility of the health care institution and practicing physician where this report was generated.  [employer] will not be held responsible for the validity of the ICD and CPT codes included on this report.  AMA assumes no liability for data contained or not contained herein. CPT is a Designer, television/film set of the [employer].

## 2014-03-02 NOTE — Anesthesia Procedure Notes (Signed)
Procedure Name: MAC Date/Time: 03/02/2014 11:12 AM Performed by: Jacob Moores Pre-anesthesia Checklist: Patient identified, Emergency Drugs available, Suction available and Patient being monitored Patient Re-evaluated:Patient Re-evaluated prior to inductionOxygen Delivery Method: Simple face mask Preoxygenation: Pre-oxygenation with 100% oxygen Placement Confirmation: positive ETCO2 Dental Injury: Teeth and Oropharynx as per pre-operative assessment

## 2014-03-02 NOTE — Anesthesia Postprocedure Evaluation (Signed)
  Anesthesia Post-op Note  Patient: Brent Little  Procedure(s) Performed: Procedure(s): COLONOSCOPY WITH PROPOFOL (N/A)  Patient Location: PACU  Anesthesia Type:MAC  Level of Consciousness: awake, alert , oriented and patient cooperative  Airway and Oxygen Therapy: Patient Spontanous Breathing  Post-op Pain: none  Post-op Assessment: Post-op Vital signs reviewed, Patient's Cardiovascular Status Stable, Respiratory Function Stable, Patent Airway and No signs of Nausea or vomiting  Post-op Vital Signs: stable  Last Vitals:  Filed Vitals:   03/02/14 1230  BP: 157/66  Pulse: 62  Temp:   Resp: 18    Complications: No apparent anesthesia complications

## 2014-03-02 NOTE — Progress Notes (Signed)
Patient not able to drink most of the Golytely. Stool watery but not clear. Patient's wife aware and in the room.

## 2014-03-02 NOTE — Progress Notes (Signed)
PT Cancellation Note  Patient Details Name: Brent Little MRN: [id number] DOB: 1941-04-11   Cancelled Treatment:    Reason Eval/Treat Not Completed: Patient at procedure or test/unavailable Pt off unit for colonoscopy. Will follow up as time allows.   [place], [place]   Candie Mile S 03/02/2014, 11:19 AM

## 2014-03-02 NOTE — H&P (View-Only) (Signed)
[facility] Progress Note  Subjective: Feels weak, nobloody stools reported  Objective: Vital signs in last 24 hours: Temp:  [98 F (36.7 C)-98.8 F (37.1 C)] 98 F (36.7 C) (10/22 0350) Pulse Rate:  [54-82] 66 (10/22 0350) Resp:  [17-18] 17 (10/22 0350) BP: (115-135)/(51-67) 133/51 mmHg (10/22 0350) SpO2:  [90 %-94 %] 90 % (10/22 0350) Weight:  [80.65 kg (177 lb 12.8 oz)] 80.65 kg (177 lb 12.8 oz) (10/22 0350) Weight change: -0.951 kg (-2 lb 1.5 oz)   RK:[id number]  Lab Results: Results for orders placed during the hospital encounter of 02/15/14 (from the past 24 hour(s))  CBC     Status: Abnormal   Collection Time    02/28/14  7:37 PM      Result Value Ref Range   WBC 9.1  4.0 - 10.5 K/uL   RBC 3.11 (*) 4.22 - 5.81 MIL/uL   Hemoglobin 8.0 (*) 13.0 - 17.0 g/dL   HCT 23.9 (*) 39.0 - 52.0 %   MCV 76.8 (*) 78.0 - 100.0 fL   MCH 25.7 (*) 26.0 - 34.0 pg   MCHC 33.5  30.0 - 36.0 g/dL   RDW 15.5  11.5 - 15.5 %   Platelets 236  150 - 400 K/uL  SODIUM, URINE, RANDOM     Status: None   Collection Time    03/01/14 12:36 AM      Result Value Ref Range   Sodium, Ur 37    CREATININE, URINE, RANDOM     Status: None   Collection Time    03/01/14 12:36 AM      Result Value Ref Range   Creatinine, Urine 58.09    RENAL FUNCTION PANEL     Status: Abnormal   Collection Time    03/01/14  4:48 AM      Result Value Ref Range   Sodium 142  137 - 147 mEq/L   Potassium 3.8  3.7 - 5.3 mEq/L   Chloride 100  96 - 112 mEq/L   CO2 29  19 - 32 mEq/L   Glucose, Bld 95  70 - 99 mg/dL   BUN 31 (*) 6 - 23 mg/dL   Creatinine, Ser 4.02 (*) 0.50 - 1.35 mg/dL   Calcium 7.9 (*) 8.4 - 10.5 mg/dL   Phosphorus 3.5  2.3 - 4.6 mg/dL   Albumin 1.7 (*) 3.5 - 5.2 g/dL   GFR calc non Af Amer 13 (*) >90 mL/min   GFR calc Af Amer 16 (*) >90 mL/min   Anion gap 13  5 - 15  CBC     Status: Abnormal   Collection Time    03/01/14  4:48 AM      Result Value Ref Range   WBC 8.4  4.0 - 10.5 K/uL    RBC 3.02 (*) 4.22 - 5.81 MIL/uL   Hemoglobin 7.8 (*) 13.0 - 17.0 g/dL   HCT 23.6 (*) 39.0 - 52.0 %   MCV 78.1  78.0 - 100.0 fL   MCH 25.8 (*) 26.0 - 34.0 pg   MCHC 33.1  30.0 - 36.0 g/dL   RDW 15.5  11.5 - 15.5 %   Platelets 224  150 - 400 K/uL    Studies/Results: Mr Brain Wo Contrast  02/28/2014   CLINICAL DATA:  Initial evaluation for acute confusion.  EXAM: MRI HEAD WITHOUT CONTRAST  TECHNIQUE: Multiplanar, multiecho pulse sequences of the brain and surrounding structures were obtained without intravenous contrast.  COMPARISON:  Prior CT from 02/07/2014  FINDINGS: Mild diffuse prominence of the CSF containing spaces is compatible with generalized cerebral atrophy. Minimal patchy T2/FLAIR hyperintensity present within the periventricular and deep white matter both cerebral hemispheres, likely related to very mild chronic microvascular ischemic changes.  No mass lesion, midline shift, or extra-axial fluid collection. Ventricles are normal in size without evidence of hydrocephalus.  No diffusion-weighted signal abnormality is identified to suggest acute intracranial infarct. Gray-white matter differentiation is maintained. Normal flow voids are seen within the intracranial vasculature. No intracranial hemorrhage identified.  The cervicomedullary junction is normal. Pituitary gland is within normal limits. Pituitary stalk is midline. The globes and optic nerves demonstrate a normal appearance with normal signal intensity. The  The bone marrow signal intensity is normal. Calvarium is intact. Visualized upper cervical spine is within normal limits.  Scalp soft tissues are unremarkable.  Paranasal sinuses are clear.  No mastoid effusion.  IMPRESSION: 1. No acute intracranial infarct or other abnormality identified. 2. Mild age-related atrophy with chronic microvascular ischemic disease.   Electronically Signed   By: Jeannine Boga M.D.   On: 02/28/2014 05:02      Assessment: 1. Hepatic  abscess 2. Ascending colon mass  Plan: Pt now willing to undergo colonoscopy here. Will schedule for 11 AM tomorrow    Farrie Sann C 03/01/2014, 10:07 AM

## 2014-03-02 NOTE — Interval H&P Note (Signed)
History and Physical Interval Note:  03/02/2014 11:14 AM  Brent Little  has presented today for surgery, with the diagnosis of colon mass on CT  The various methods of treatment have been discussed with the patient and family. After consideration of risks, benefits and other options for treatment, the patient has consented to  Procedure(s): COLONOSCOPY WITH PROPOFOL (N/A) as a surgical intervention .  The patient's history has been reviewed, patient examined, no change in status, stable for surgery.  I have reviewed the patient's chart and labs.  Questions were answered to the patient's satisfaction.     Sherylann Vangorden C

## 2014-03-02 NOTE — Progress Notes (Signed)
I have seen and examined the patient and agree with the assessment and plans. Discussed with family.  He wants surgery this admission.  Will be next week at the earliest.  Kitzia Camus A. Ninfa Linden  MD, FACS

## 2014-03-02 NOTE — Progress Notes (Signed)
Patient last stool is clear and yellowish. No formed stool.

## 2014-03-02 NOTE — Consult Note (Signed)
S: Tired after bowel prep but otherwise no complaints, ready for colonoscopy today O:BP 138/47  Pulse 67  Temp(Src) 98.3 F (36.8 Little) (Oral)  Resp 17  Ht 6' (1.829 m)  Wt 174 lb 12.8 oz (79.289 kg)  BMI 23.70 kg/m2  SpO2 94%  Intake/Output Summary (Last 24 hours) at 03/02/14 0731 Last data filed at 03/01/14 1700  Gross per 24 hour  Intake      0 ml  Output      0 ml  Net      0 ml   Intake/Output: I/O last 3 completed shifts: In: 0  Out: 150 [Urine:150]  Intake/Output this shift:    Weight change: -3 lb (-1.361 kg) Gen:NAD, thin elderly gentleman, lying in bed CVS:RRR, no MRG Resp:CTAB BPZ:WCHE, NTND, +BS Ext:WWP, no LE edema   Recent Labs Lab 02/23/14 1929 02/24/14 0510 02/25/14 0425 02/26/14 0920 02/27/14 0404 02/28/14 0854 03/01/14 0448 03/01/14 2000 03/02/14 0345  NA 138 140 140 139 140 139 142  --  141  K 3.8 4.0 3.9 4.0 4.0 3.9 3.8  --  3.4*  CL 103 105 106 105 107 102 100  --  98  CO2 22 19 19  17* 16* 24 29  --  27  GLUCOSE 118* 101* 98 105* 83 115* 95  --  82  BUN 21 23 25* 28* 32* 32* 31*  --  31*  CREATININE 2.24* 2.80* 3.50* 3.92* 4.11* 3.91* 4.02*  --  4.12*  ALBUMIN 2.1*  --   --   --  2.1*  2.1* 1.9* 1.7* 1.9* 2.0*  2.0*  CALCIUM 8.9 8.9 8.6 8.3* 8.3* 8.1* 7.9*  --  8.2*  PHOS  --   --   --   --  4.0 3.5 3.5  --  4.9*  AST 45*  --   --   --  33  --   --  22 20  ALT 39  --   --   --  31  --   --  18 17   Liver Function Tests:  Recent Labs Lab 02/27/14 0404  03/01/14 0448 03/01/14 2000 03/02/14 0345  AST 33  --   --  22 20  ALT 31  --   --  18 17  ALKPHOS 166*  --   --  127* 125*  BILITOT 0.5  --   --  0.4 0.4  PROT 6.1  --   --  5.5* 5.5*  ALBUMIN 2.1*  2.1*  < > 1.7* 1.9* 2.0*  2.0*  < > = values in this interval not displayed. No results found for this basename: LIPASE, AMYLASE,  in the last 168 hours No results found for this basename: AMMONIA,  in the last 168 hours CBC:  Recent Labs Lab 02/28/14 0854 02/28/14 1937  03/01/14 0448 03/02/14 0345  WBC 8.8 9.1 8.4 9.1  HGB 8.4* 8.0* 7.8* 8.2*  HCT 25.2* 23.9* 23.6* 26.1*  MCV 79.5 76.8* 78.1 79.3  PLT 254 236 224 251   Cardiac Enzymes:  Recent Labs Lab 02/25/14 1537  CKTOTAL 22   CBG: No results found for this basename: GLUCAP,  in the last 168 hours  Iron Studies:  No results found for this basename: IRON, TIBC, TRANSFERRIN, FERRITIN,  in the last 72 hours Studies/Results: US Abdomen Limited Ruq  03/02/2014   CLINICAL DATA:  73 year old male with acute nausea. Current history of right hepatic lobe abscess drainage. Initial encounter.  EXAM: US ABDOMEN LIMITED -  RIGHT UPPER QUADRANT  COMPARISON:  CT Abdomen and Pelvis 02/25/2014.  FINDINGS: Gallbladder:  Two shadowing echogenic gallstones identified, the largest is 9 mm in diameter (image 19). Wall thickness remains normal at 2 mm. No sonographic Murphy sign elicited.  Common bile duct:  Diameter: 3 mm, normal  Liver:  Heterogeneous echotexture in the right lobe, the site of the right pigtail drainage catheter. No measurable liver lesion. No intrahepatic biliary ductal dilatation.  Other findings:  Right pleural effusion.  IMPRESSION: 1. Heterogeneous echotexture in the right lobe at the site of recent drainage catheter. No measurable liver lesion. 2. Right pleural effusion. 3. Cholelithiasis without sonographic evidence of acute cholecystitis.   Electronically Signed   By: Brent Little M.D.   On: 03/02/2014 00:26   . Influenza vac split quadrivalent PF  0.5 mL Intramuscular Tomorrow-1000  . pneumococcal 23 valent vaccine  0.5 mL Intramuscular Tomorrow-1000  . terazosin  5 mg Oral Daily    Assessment/Plan: 73yo man who presented with 1 month FTT found to have a liver abscess and ascending colon mass.  1. AKI/CKD in setting of volume depletion/hypotension/Ace-inhibition/NSAIDs/vanc toxicity. Continue to hold vanco and renal dose medication.  1. May need to restart IV fluids after procedure until po  improves 2. Kidney function plateaued, we will sign off, he should f/u with his primary nephrologist outpatient 2. Liver abscess vs necrotic mets- will need tissue diagnosis to help guide therapy as well as prognosis.  3. Anemia- normocytic-?GIB vs due to malignancy  4. HTN- stable, cont to hold ACE 5. Encephalopathy-returned, primary service to eval with MRI brain, negative 6. N/V/D/protein malnutrition- GI following, cdif negative 7. Colon mass- as above 1. Colonoscopy scheduled for 11am today 8. Severe protein malnutrition -per primary  Brent Little  Renal Attending: His kidney fct has stabilized but is worse.  Colonoscopy findings and surgical plans noted.  He is high risk for needing dialysis given current GFR and plans for surgery but no good options.  We will continue to follow and attempt to support perioperatively and speak with pt and family about potential need for dialysis short or long term. Brent Little

## 2014-03-02 NOTE — Transfer of Care (Signed)
Immediate Anesthesia Transfer of Care Note  Patient: Brent Little  Procedure(s) Performed: Procedure(s): COLONOSCOPY WITH PROPOFOL (N/A)  Patient Location: Endoscopy Unit  Anesthesia Type:MAC  Level of Consciousness: awake and alert   Airway & Oxygen Therapy: Patient Spontanous Breathing and Patient connected to nasal cannula oxygen  Post-op Assessment: Report given to PACU RN, Post -op Vital signs reviewed and stable and Patient moving all extremities X 4  Post vital signs: Reviewed and stable  Complications: No apparent anesthesia complications

## 2014-03-02 NOTE — Progress Notes (Signed)
Colonoscopy performed. Largedescending circumferential mass obscuring the cecum. Second semicircumferential friable mass at about 60 cm in the descending colon, biopsied and tattooed with [place] ink. Will reconsult surgery

## 2014-03-02 NOTE — Progress Notes (Signed)
Patient ID: Brent Little, male   DOB: 03-22-1941, 73 y.o.   MRN: [id number] Day of Surgery  Subjective: Pt is known to [place] as we saw about a week and a half ago for this colon mass on the right side and his liver abscess.  The liver abscess has resolved and his drain has been pulled.  He began having some BRBPR a couple of days ago.  GI was called back and a colonoscopy was performed today.  The patient appears to have synchronous lesions of his ascending colon near hepatic flexures based off of his CT scan as well as a descending colon mass.  We have been called back to re-evaluate the patient.  He has no new complaints.  Objective: Vital signs in last 24 hours: Temp:  [97.5 F (36.4 C)-98.3 F (36.8 C)] 97.5 F (36.4 C) (10/23 1421) Pulse Rate:  [50-71] 71 (10/23 1421) Resp:  [17-22] 18 (10/23 1421) BP: (136-162)/(47-70) 161/64 mmHg (10/23 1421) SpO2:  [92 %-99 %] 93 % (10/23 1421) Weight:  [174 lb 12.8 oz (79.289 kg)] 174 lb 12.8 oz (79.289 kg) (10/23 0515) Last BM Date: 03/01/14  Intake/Output from previous day:   Intake/Output this shift: Total I/O In: 150 [P.O.:150] Out: -   PE: Abd: soft, mild distention after c-scope, NT, +BS, no masses, hernias noted Heart: regular, rate, and rhythm Lungs: CTAB  Lab Results:   Recent Labs  03/01/14 0448 03/02/14 0345  WBC 8.4 9.1  HGB 7.8* 8.2*  HCT 23.6* 26.1*  PLT 224 251   BMET  Recent Labs  03/01/14 0448 03/02/14 0345  NA 142 141  K 3.8 3.4*  CL 100 98  CO2 29 27  GLUCOSE 95 82  BUN 31* 31*  CREATININE 4.02* 4.12*  CALCIUM 7.9* 8.2*   PT/INR No results found for this basename: LABPROT, INR,  in the last 72 hours CMP     Component Value Date/Time   NA 141 03/02/2014 0345   K 3.4* 03/02/2014 0345   CL 98 03/02/2014 0345   CO2 27 03/02/2014 0345   GLUCOSE 82 03/02/2014 0345   BUN 31* 03/02/2014 0345   CREATININE 4.12* 03/02/2014 0345   CALCIUM 8.2* 03/02/2014 0345   PROT 5.5* 03/02/2014 0345   ALBUMIN 2.0*  03/02/2014 0345   ALBUMIN 2.0* 03/02/2014 0345   AST 20 03/02/2014 0345   ALT 17 03/02/2014 0345   ALKPHOS 125* 03/02/2014 0345   BILITOT 0.4 03/02/2014 0345   GFRNONAA 13* 03/02/2014 0345   GFRAA 15* 03/02/2014 0345   Lipase  No results found for this basename: lipase       Studies/Results: US Abdomen Limited Ruq  03/02/2014   CLINICAL DATA:  73 year old male with acute nausea. Current history of right hepatic lobe abscess drainage. Initial encounter.  EXAM: US ABDOMEN LIMITED - RIGHT UPPER QUADRANT  COMPARISON:  CT Abdomen and Pelvis 02/25/2014.  FINDINGS: Gallbladder:  Two shadowing echogenic gallstones identified, the largest is 9 mm in diameter (image 19). Wall thickness remains normal at 2 mm. No sonographic Murphy sign elicited.  Common bile duct:  Diameter: 3 mm, normal  Liver:  Heterogeneous echotexture in the right lobe, the site of the right pigtail drainage catheter. No measurable liver lesion. No intrahepatic biliary ductal dilatation.  Other findings:  Right pleural effusion.  IMPRESSION: 1. Heterogeneous echotexture in the right lobe at the site of recent drainage catheter. No measurable liver lesion. 2. Right pleural effusion. 3. Cholelithiasis without sonographic evidence of acute cholecystitis.  Electronically Signed   By: Lars Pinks M.D.   On: 03/02/2014 00:26    Anti-infectives: Anti-infectives   Start     Dose/Rate Route Frequency Ordered Stop   02/26/14 1600  piperacillin-tazobactam (ZOSYN) IVPB 2.25 g  Status:  Discontinued     2.25 g 100 mL/hr over 30 Minutes Intravenous 3 times per day 02/26/14 1222 02/26/14 1849   02/18/14 1800  vancomycin (VANCOCIN) IVPB 750 mg/150 ml premix  Status:  Discontinued     750 mg 150 mL/hr over 60 Minutes Intravenous Every 12 hours 02/18/14 1726 02/24/14 1650   02/16/14 0000  piperacillin-tazobactam (ZOSYN) IVPB 3.375 g  Status:  Discontinued     3.375 g 12.5 mL/hr over 240 Minutes Intravenous 3 times per day 02/15/14 1516  02/26/14 1222   02/15/14 1600  vancomycin (VANCOCIN) IVPB 1000 mg/200 mL premix  Status:  Discontinued     1,000 mg 200 mL/hr over 60 Minutes Intravenous Every 12 hours 02/15/14 1516 02/18/14 1726   02/15/14 1600  piperacillin-tazobactam (ZOSYN) IVPB 3.375 g     3.375 g 100 mL/hr over 30 Minutes Intravenous  Once 02/15/14 1516 02/15/14 1900       Assessment/Plan  1. Synchronous colonic lesions, likely malignant, biopsies pending 2. Recent liver abscess, resolved s/p perc drain 3. HTN 4. Confusion/acute encephalopathy 5. Anemia 6. ARF, Cr 4.12  Plan: 1. The patient would benefit from an operation for these 2 lesions.  Depending on their exact location he may be able to get 2 colectomies and anastomoses or her may require a total abdominal colectomy.   2. He needs to remain on clear liquids so we can avoid another prep. 3. This is not something that will be done over the weekend.  Hopefully, we can pursue surgical intervention early next week. 4. He did choke on some water while I was in there and apparently had a hard time with his colon prep as well.  ? Whether he needs a swallow evaluation to make sure he can tolerate thin liquids and is not aspirating some.    LOS: 15 days    Elcie Pelster E 03/02/2014, 2:55 PM Pager: [phone]

## 2014-03-03 DIAGNOSIS — C189 Malignant neoplasm of colon, unspecified: Secondary | ICD-10-CM

## 2014-03-03 LAB — BASIC METABOLIC PANEL
Anion gap: 15 (ref 5–15)
BUN: 31 mg/dL — AB (ref 6–23)
CALCIUM: 8.4 mg/dL (ref 8.4–10.5)
CO2: 26 meq/L (ref 19–32)
CREATININE: 4.06 mg/dL — AB (ref 0.50–1.35)
Chloride: 103 mEq/L (ref 96–112)
GFR calc Af Amer: 15 mL/min — ABNORMAL LOW (ref >90)
GFR calc non Af Amer: 13 mL/min — ABNORMAL LOW (ref >90)
GLUCOSE: 89 mg/dL (ref 70–99)
Potassium: 3.5 mEq/L — ABNORMAL LOW (ref 3.7–5.3)
SODIUM: 144 meq/L (ref 137–147)

## 2014-03-03 LAB — URINALYSIS, ROUTINE W REFLEX MICROSCOPIC
Bilirubin Urine: NEGATIVE
GLUCOSE, UA: NEGATIVE mg/dL
KETONES UR: 15 mg/dL — AB
Nitrite: NEGATIVE
PH: 7.5 (ref 5.0–8.0)
Protein, ur: 30 mg/dL — AB
Specific Gravity, Urine: 1.008 (ref 1.005–1.030)
Urobilinogen, UA: 0.2 mg/dL (ref 0.0–1.0)

## 2014-03-03 LAB — URINE MICROSCOPIC-ADD ON

## 2014-03-03 LAB — CBC
HCT: 24.6 % — ABNORMAL LOW (ref 39.0–52.0)
HEMOGLOBIN: 8.1 g/dL — AB (ref 13.0–17.0)
MCH: 26 pg (ref 26.0–34.0)
MCHC: 32.9 g/dL (ref 30.0–36.0)
MCV: 79.1 fL (ref 78.0–100.0)
Platelets: 292 10*3/uL (ref 150–400)
RBC: 3.11 MIL/uL — ABNORMAL LOW (ref 4.22–5.81)
RDW: 15.7 % — ABNORMAL HIGH (ref 11.5–15.5)
WBC: 10.6 10*3/uL — ABNORMAL HIGH (ref 4.0–10.5)

## 2014-03-03 MED ORDER — TRAZODONE 25 MG HALF TABLET
25.0000 mg | ORAL_TABLET | Freq: Every evening | ORAL | Status: DC | PRN
  Administered 2014-03-09: 25 mg via ORAL
  Filled 2014-03-03 (×2): qty 1

## 2014-03-03 MED ORDER — AMLODIPINE BESYLATE 5 MG PO TABS
5.0000 mg | ORAL_TABLET | Freq: Every day | ORAL | Status: DC
  Administered 2014-03-03 – 2014-03-04 (×2): 5 mg via ORAL
  Filled 2014-03-03 (×3): qty 1

## 2014-03-03 MED ORDER — DEXTROSE-NACL 5-0.45 % IV SOLN
INTRAVENOUS | Status: DC
  Administered 2014-03-03 (×2): via INTRAVENOUS
  Administered 2014-03-04: 100 mL/h via INTRAVENOUS
  Administered 2014-03-05: 1000 mL via INTRAVENOUS
  Administered 2014-03-05: 07:00:00 via INTRAVENOUS
  Administered 2014-03-06: 1 mL via INTRAVENOUS
  Administered 2014-03-07: 01:00:00 via INTRAVENOUS

## 2014-03-03 MED ORDER — GUAIFENESIN 100 MG/5ML PO SYRP
200.0000 mg | ORAL_SOLUTION | ORAL | Status: DC | PRN
  Filled 2014-03-03: qty 10

## 2014-03-03 NOTE — Progress Notes (Signed)
Patient ID: Brent Little, male   DOB: [birth year], 73 y.o.   MRN: [id number] 1 Day Post-Op  Subjective: Pt without complaints.  No further blood per rectum.  Pt able to play 18 holes golf.  No chest pain or shortness of breath.  Objective: Vital signs in last 24 hours: Temp:  [97.5 F (36.4 C)-98.5 F (36.9 C)] 98.5 F (36.9 C) (10/24 0540) Pulse Rate:  [50-71] 55 (10/24 0900) Resp:  [18-22] 18 (10/24 0540) BP: (136-163)/(57-70) 159/68 mmHg (10/24 0900) SpO2:  [92 %-99 %] 93 % (10/24 0540) Weight:  [175 lb 11.3 oz (79.7 kg)] 175 lb 11.3 oz (79.7 kg) (10/24 0558) Last BM Date: 03/02/14  Intake/Output from previous day: 10/23 0701 - 10/24 0700 In: 150 [P.O.:150] Out: 450 [Urine:450] Intake/Output this shift:    PE: Gen:  A&O x 3 Abd: soft, NT, sl distended Heart: regular Lungs: breathing comfortably  Lab Results:   Recent Labs  03/02/14 0345 03/03/14 0252  WBC 9.1 10.6*  HGB 8.2* 8.1*  HCT 26.1* 24.6*  PLT 251 292   BMET  Recent Labs  03/02/14 0345 03/03/14 0252  NA 141 144  K 3.4* 3.5*  CL 98 103  CO2 27 26  GLUCOSE 82 89  BUN 31* 31*  CREATININE 4.12* 4.06*  CALCIUM 8.2* 8.4   PT/INR No results found for this basename: LABPROT, INR,  in the last 72 hours CMP     Component Value Date/Time   NA 144 03/03/2014 0252   K 3.5* 03/03/2014 0252   CL 103 03/03/2014 0252   CO2 26 03/03/2014 0252   GLUCOSE 89 03/03/2014 0252   BUN 31* 03/03/2014 0252   CREATININE 4.06* 03/03/2014 0252   CALCIUM 8.4 03/03/2014 0252   PROT 5.5* 03/02/2014 0345   ALBUMIN 2.0* 03/02/2014 0345   ALBUMIN 2.0* 03/02/2014 0345   AST 20 03/02/2014 0345   ALT 17 03/02/2014 0345   ALKPHOS 125* 03/02/2014 0345   BILITOT 0.4 03/02/2014 0345   GFRNONAA 13* 03/03/2014 0252   GFRAA 15* 03/03/2014 0252   Lipase  No results found for this basename: lipase       Studies/Results: US Abdomen Limited Ruq  03/02/2014   CLINICAL DATA:  73 year old male with acute nausea. Current  history of right hepatic lobe abscess drainage. Initial encounter.  EXAM: US ABDOMEN LIMITED - RIGHT UPPER QUADRANT  COMPARISON:  CT Abdomen and Pelvis 02/25/2014.  FINDINGS: Gallbladder:  Two shadowing echogenic gallstones identified, the largest is 9 mm in diameter (image 19). Wall thickness remains normal at 2 mm. No sonographic Murphy sign elicited.  Common bile duct:  Diameter: 3 mm, normal  Liver:  Heterogeneous echotexture in the right lobe, the site of the right pigtail drainage catheter. No measurable liver lesion. No intrahepatic biliary ductal dilatation.  Other findings:  Right pleural effusion.  IMPRESSION: 1. Heterogeneous echotexture in the right lobe at the site of recent drainage catheter. No measurable liver lesion. 2. Right pleural effusion. 3. Cholelithiasis without sonographic evidence of acute cholecystitis.   Electronically Signed   By: Lars Pinks M.D.   On: 03/02/2014 00:26    Anti-infectives: Anti-infectives   Start     Dose/Rate Route Frequency Ordered Stop   02/26/14 1600  piperacillin-tazobactam (ZOSYN) IVPB 2.25 g  Status:  Discontinued     2.25 g 100 mL/hr over 30 Minutes Intravenous 3 times per day 02/26/14 1222 02/26/14 1849   02/18/14 1800  vancomycin (VANCOCIN) IVPB 750 mg/150 ml premix  Status:  Discontinued     750 mg 150 mL/hr over 60 Minutes Intravenous Every 12 hours 02/18/14 1726 02/24/14 1650   02/16/14 0000  piperacillin-tazobactam (ZOSYN) IVPB 3.375 g  Status:  Discontinued     3.375 g 12.5 mL/hr over 240 Minutes Intravenous 3 times per day 02/15/14 1516 02/26/14 1222   02/15/14 1600  vancomycin (VANCOCIN) IVPB 1000 mg/200 mL premix  Status:  Discontinued     1,000 mg 200 mL/hr over 60 Minutes Intravenous Every 12 hours 02/15/14 1516 02/18/14 1726   02/15/14 1600  piperacillin-tazobactam (ZOSYN) IVPB 3.375 g     3.375 g 100 mL/hr over 30 Minutes Intravenous  Once 02/15/14 1516 02/15/14 1900       Assessment/Plan  1. Synchronous colonic lesions,  likely malignant, biopsies pending 2. Recent liver abscess, resolved s/p perc drain 3. HTN 4. Confusion/acute encephalopathy- improved 5. Anemia 6. ARF, Cr remaining around 4  Plan: Plan subtotal colectomy early in week.  Dr. Hulen Skains to review.   Would leave on clears so no additional clean out needed. Briefly reviewed risks, benefits  LOS: 16 days    Stoney Karczewski 03/03/2014, 9:55 AM Pager: [phone]

## 2014-03-03 NOTE — Progress Notes (Signed)
[facility] PROGRESS NOTE  Brent Little LFY:[id number] DOB: 02-Oct-1940 DOA: 02/15/2014  PCP: [name], MD  Brief Narrative: Brent Little is a 73 y.o. male, With H/O HTN, Kidney stone, who initially presented to [place] with fever, dull right upper quadrant pain and a dry cough. He was diagnosed there with a possible community-acquired pneumonia, was placed on Levaquin, he had marginal improvement. During his fever workup he had a right upper quadrant ultrasound which showed some nonspecific changes . He was then discharged home with outpatient MRI. MRI done in the outpatient setting showed a septated cystic lesion in the right hepatic lobe concerning for abscess versus metastatic necrotic mass. MRI also showed an ascending colon circumferential mass. He presented to [facility] with fever, chills, decreased appetite and lethargy. He was followed by [facility]/GI and IR. He has been on broad spectrum Abx, finally underwent Liver biopsy 10/12. And had a drain put it. His creatinine started creeping up on 10/16. Vancomycin trough came back elevated. Stopped vancomycin and Nephrology was consulted. His blood cultures and cultures from the liver abscess have all come back negative and zosyn was discontinued on 10/19 after completing 10 days of IV antibiotics. Now with minimal rectal bleeding. GI reconsulted and he underwent colonoscopy 10/23 which revealed colon cancer.  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 5F drain was placed under US guidance. Culture and gram stain were sent. No organisms seen. Blood cultures are  negative. Cytology was negative for malignant cells. He has completed 10 days of IV antibiotics. Vancomycin was stopped on 10/16 after the trough came back elevated. A repeat CT abdomen and pelvis without contrast showed decrease in the size of the collection. Drain removed 10/21.   Colon  Cancer/Hematochezia GI consulted for colonoscopy. They initially recommended outpatient colonoscopy when he recovers from the abscess. However he developed mild rectal bleeding. Colonoscopy done on 10/23 which revealed colon cancer. CEA mildly elevated at 9. No further blood in stool. Surgery consulted but it is felt that if his renal function doesn't improve by Monday it may be better to delay surgery by few weeks to give kidneys a chance to recover. Discussed with Dr. Florene Glen and Dr. Barry Dienes. Discussed with patient's wife as well. They are all in agreement.  Acute renal failure Probably secondary to vancomycin toxicity vs ace inhibition vs NSAID use vs volume depletion. [place] was negative for hydronephrosis. UA is negative. Vancomycin trough was 47.3. Nephrology following. Was given HCO3 infusion. Renal function is stable though not back to baseline. See above as well.  Nausea/Poor Appetite Likely due to acute illness, ARF. US shows gallstones but no acute inflammation. LFT's normal. Continue to monitor.  Essential HTN BP fluctuates but stable. ACE on hold  Iron Deficiency Anemia Hgb noted to be lower over last 2 days but stable. No further bleeding noted. Monitor for now.   Dysuria/Hematuria Urine was reddish appearing. Will check UA. Monitor for now.  Confusion/Acute Encephalopathy Initially thought to be secondary to narcotic pain medications. These were stopped. MRI brian was done due to persistent symptoms. MRI was negative. Stable with occasional periods of confusion. Possibly due to hospital stay. Patient likely has underlying cognitive impairment.   Sinus Bradycardia Asymptomatic.  DVT proph: SCD's Code Status: Full Code Family Communication: Discussed with patient and wife at bedside Disposition Plan: SNF recommended by PT. Not ready for discharge. If surgery is delayed, he could go to SNF on Monday/Tuesday.  Procedures  IR placed drain in liver lesion.  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 Denies any pain.   Objective: Filed Vitals:   03/03/14 0900  BP: 159/68  Pulse: 55  Temp:   Resp:     Intake/Output Summary (Last 24 hours) at 03/03/14 1029 Last data filed at 03/03/14 1025  Gross per 24 hour  Intake    270 ml  Output    450 ml  Net   -180 ml   Filed Weights   03/01/14 0350 04/01/14 0515 03/03/14 0558  Weight: 80.65 kg (177 lb 12.8 oz) 79.289 kg (174 lb 12.8 oz) 79.7 kg (175 lb 11.3 oz)    Exam:   General:  alert and conversant, but confused..   Cardiovascular: S1S2/RRR  Respiratory: CTA bilaterally  Abdomen: soft, mild tenderness in RUQ, BS present. Drain removed.  Musculoskeletal: no edema  Data Reviewed: Basic Metabolic Panel:  Recent Labs Lab 02/26/14 0920 02/27/14 0404 02/28/14 0854 03/01/14 0448 01-Apr-2014 0345 03/03/14 0252  NA 139 140 139 142 141 144  K 4.0 4.0 3.9 3.8 3.4* 3.5*  CL 105 107 102 100 98 103  CO2 17* 16* 24 29 27 26   GLUCOSE 105* 83 115* 95 82 89  BUN 28* 32* 32* 31* 31* 31*  CREATININE 3.92* 4.11* 3.91* 4.02* 4.12* 4.06*  CALCIUM 8.3* 8.3* 8.1* 7.9* 8.2* 8.4  PHOS  --  4.0 3.5 3.5 4.9*  --    Liver Function Tests:  Recent Labs Lab 02/27/14 0404 02/28/14 0854 03/01/14 0448 03/01/14 2000 2014/04/01 0345  AST 33  --   --  22 20  ALT 31  --   --  18 17  ALKPHOS 166*  --   --  127* 125*  BILITOT 0.5  --   --  0.4 0.4  PROT 6.1  --   --  5.5* 5.5*  ALBUMIN 2.1*  2.1* 1.9* 1.7* 1.9* 2.0*  2.0*   CBC:  Recent Labs Lab 02/28/14 0854 02/28/14 1937 03/01/14 0448 04-01-14 0345 03/03/14 0252  WBC 8.8 9.1 8.4 9.1 10.6*  HGB 8.4* 8.0* 7.8* 8.2* 8.1*  HCT 25.2* 23.9* 23.6* 26.1* 24.6*  MCV 79.5 76.8* 78.1 79.3 79.1  PLT 254 236 224 251 292   Cardiac Enzymes:  Recent Labs Lab 02/25/14 1537  CKTOTAL 22     Recent Results (from the past 240 hour(s))   CLOSTRIDIUM DIFFICILE BY PCR     Status: None   Collection Time    02/25/14  2:17 PM      Result Value Ref Range Status   C difficile by pcr NEGATIVE  NEGATIVE Final     Studies: US Abdomen Limited Ruq  2014/04/01   CLINICAL DATA:  73 year old male with acute nausea. Current history of right hepatic lobe abscess drainage. Initial encounter.  EXAM: US ABDOMEN LIMITED - RIGHT UPPER QUADRANT  COMPARISON:  CT Abdomen and Pelvis 02/25/2014.  FINDINGS: Gallbladder:  Two shadowing echogenic gallstones identified, the largest is 9 mm in diameter (image 19). Wall thickness remains normal at 2 mm. No sonographic Murphy sign elicited.  Common bile duct:  Diameter: 3 mm, normal  Liver:  Heterogeneous echotexture in the right lobe, the site of the right pigtail drainage catheter. No measurable liver lesion. No intrahepatic biliary ductal dilatation.  Other findings:  Right pleural effusion.  IMPRESSION: 1. Heterogeneous echotexture in the right lobe  at the site of recent drainage catheter. No measurable liver lesion. 2. Right pleural effusion. 3. Cholelithiasis without sonographic evidence of acute cholecystitis.   Electronically Signed   By: Lars Pinks M.D.   On: 03/02/2014 00:26    Scheduled Meds: . Influenza vac split quadrivalent PF  0.5 mL Intramuscular Tomorrow-1000  . pneumococcal 23 valent vaccine  0.5 mL Intramuscular Tomorrow-1000  . terazosin  5 mg Oral Daily   Continuous Infusions: . sodium chloride 10 mL/hr (03/01/14 1233)  . dextrose 5 % and 0.45% NaCl     Antibiotics Given (last 72 hours)   None      Principal Problem:   Essential hypertension Active Problems:   Liver abscess   BPH (benign prostatic hyperplasia)   Colonic mass   Acute renal failure  Time spent: 2min   Glorine Hanratty   [facility]  Pager [phone].   If 7PM-7AM, please contact night-coverage at [email or web address], password [email or web address] 03/03/2014, 10:29 AM  LOS: 16 days

## 2014-03-03 NOTE — Evaluation (Signed)
Clinical/Bedside Swallow Evaluation Patient Details  Name: Brent Little MRN: [id number] Date of Birth: Oct 05, 1940  Today's Date: 03/03/2014 Time: 1029-1039 SLP Time Calculation (min): 10 min  Past Medical History:  Past Medical History  Diagnosis Date  . Hypertension   . Acute renal failure 02/24/2014   Past Surgical History:  Past Surgical History  Procedure Laterality Date  . Tonsillectomy     HPI:  73 y.o. male admitted with colon mass - circumferential ascending and smaller descending colon masses, surgery consulted and subtotal colectomy planned for early next week;  recent liver abscess, resolved s/p perc drain; HTN; confusion/acute encephalopathy. Pt observed to cough while drinking water - swallow eval was ordered to ensure he can tolerate thin liquids and is not aspirating.  Needs to remain on clears pending surgery so no additional clean-out is needed.     Assessment / Plan / Recommendation Clinical Impression  Limited swallow evaluation given diet restricted to clear liquids due to pending surgery.  However, pt presents with no focal CN deficits; RR and respiratory/swallowing sequence are WNL; palpable and consistent swalllow response is present; there were no overt s/s of aspiration.  No s/s suggestive of dysphagia.  Recommend advancing diet per MD when pt is ready; maintain HOB at 90 degrees when eating, and ensure pt is sufficiently alert when eating.  No SLP f/u is warranted - sill sign off.            Diet Recommendation  (advance per MD)   Liquid Administration via: Straw Medication Administration: Whole meds with liquid Supervision: Patient able to self feed Postural Changes and/or Swallow Maneuvers: Seated upright 90 degrees    Other  Recommendations Oral Care Recommendations: Oral care BID   Follow Up Recommendations  None      Swallow Study       General  Type of Study: Bedside swallow evaluation Previous Swallow Assessment: none per records Diet  Prior to this Study: Thin liquids Temperature Spikes Noted: No Respiratory Status: Room air Behavior/Cognition: Alert Oral Cavity - Dentition: Missing dentition Self-Feeding Abilities: Able to feed self Patient Positioning: Upright in bed Baseline Vocal Quality: Clear Volitional Cough: Strong Volitional Swallow: Able to elicit    Oral/Motor/Sensory Function Overall Oral Motor/Sensory Function: Appears within functional limits for tasks assessed   Ice Chips Ice chips: Not tested   Thin Liquid Thin Liquid: Within functional limits Presentation: Cup;Straw    Nectar Thick Nectar Thick Liquid: Not tested   Honey Thick Honey Thick Liquid: Not tested   Puree Puree: Not tested - restricted to clears  Solid  Nesta Kimple L. B[place], [place] CCC/SLP Pager [phone]     Solid: Not tested - restricted to clears      Juan Quam Laurice 03/03/2014,10:41 AM

## 2014-03-03 NOTE — Progress Notes (Signed)
S: No complaints, very confused this morning, thought I was his wife and that he was at a homeless shelter O:BP 149/62  Pulse 58  Temp(Src) 98.5 F (36.9 C) (Oral)  Resp 18  Ht 6' (1.829 m)  Wt 175 lb 11.3 oz (79.7 kg)  BMI 23.82 kg/m2  SpO2 93%  Intake/Output Summary (Last 24 hours) at 03/03/14 0708 Last data filed at 03/03/14 0049  Gross per 24 hour  Intake    150 ml  Output    450 ml  Net   -300 ml   Intake/Output: I/O last 3 completed shifts: In: 150 [P.O.:150] Out: 450 [Urine:450]  Intake/Output this shift:    Weight change: 14.5 oz (0.411 kg) Gen:NAD, thin elderly gentleman, lying in bed CVS:RRR, no MRG Resp:CTAB WUX:LKGM, NTND, +BS Ext:WWP, no LE edema   Recent Labs Lab 02/25/14 0425 02/26/14 0920 02/27/14 0404 02/28/14 0854 03/01/14 0448 03/01/14 2000 03/02/14 0345 03/03/14 0252  NA 140 139 140 139 142  --  141 144  K 3.9 4.0 4.0 3.9 3.8  --  3.4* 3.5*  CL 106 105 107 102 100  --  98 103  CO2 19 17* 16* 24 29  --  27 26  GLUCOSE 98 105* 83 115* 95  --  82 89  BUN 25* 28* 32* 32* 31*  --  31* 31*  CREATININE 3.50* 3.92* 4.11* 3.91* 4.02*  --  4.12* 4.06*  ALBUMIN  --   --  2.1*  2.1* 1.9* 1.7* 1.9* 2.0*  2.0*  --   CALCIUM 8.6 8.3* 8.3* 8.1* 7.9*  --  8.2* 8.4  PHOS  --   --  4.0 3.5 3.5  --  4.9*  --   AST  --   --  33  --   --  22 20  --   ALT  --   --  31  --   --  18 17  --    Liver Function Tests:  Recent Labs Lab 02/27/14 0404  03/01/14 0448 03/01/14 2000 03/02/14 0345  AST 33  --   --  22 20  ALT 31  --   --  18 17  ALKPHOS 166*  --   --  127* 125*  BILITOT 0.5  --   --  0.4 0.4  PROT 6.1  --   --  5.5* 5.5*  ALBUMIN 2.1*  2.1*  < > 1.7* 1.9* 2.0*  2.0*  < > = values in this interval not displayed. No results found for this basename: LIPASE, AMYLASE,  in the last 168 hours No results found for this basename: AMMONIA,  in the last 168 hours CBC:  Recent Labs Lab 02/28/14 0854 02/28/14 1937 03/01/14 0448 03/02/14 0345  03/03/14 0252  WBC 8.8 9.1 8.4 9.1 10.6*  HGB 8.4* 8.0* 7.8* 8.2* 8.1*  HCT 25.2* 23.9* 23.6* 26.1* 24.6*  MCV 79.5 76.8* 78.1 79.3 79.1  PLT 254 236 224 251 292   Cardiac Enzymes:  Recent Labs Lab 02/25/14 1537  CKTOTAL 22   CBG: No results found for this basename: GLUCAP,  in the last 168 hours  Iron Studies:  No results found for this basename: IRON, TIBC, TRANSFERRIN, FERRITIN,  in the last 72 hours Studies/Results: US Abdomen Limited Ruq  03/02/2014   CLINICAL DATA:  73 year old male with acute nausea. Current history of right hepatic lobe abscess drainage. Initial encounter.  EXAM: US ABDOMEN LIMITED - RIGHT UPPER QUADRANT  COMPARISON:  CT Abdomen and  Pelvis 02/25/2014.  FINDINGS: Gallbladder:  Two shadowing echogenic gallstones identified, the largest is 9 mm in diameter (image 19). Wall thickness remains normal at 2 mm. No sonographic Murphy sign elicited.  Common bile duct:  Diameter: 3 mm, normal  Liver:  Heterogeneous echotexture in the right lobe, the site of the right pigtail drainage catheter. No measurable liver lesion. No intrahepatic biliary ductal dilatation.  Other findings:  Right pleural effusion.  IMPRESSION: 1. Heterogeneous echotexture in the right lobe at the site of recent drainage catheter. No measurable liver lesion. 2. Right pleural effusion. 3. Cholelithiasis without sonographic evidence of acute cholecystitis.   Electronically Signed   By: Lars Pinks M.D.   On: 03/02/2014 00:26   . Influenza vac split quadrivalent PF  0.5 mL Intramuscular Tomorrow-1000  . pneumococcal 23 valent vaccine  0.5 mL Intramuscular Tomorrow-1000  . terazosin  5 mg Oral Daily    Assessment/Plan: 73yo man who presented with 1 month FTT found to have a liver abscess and colon masses.  1. AKI/CKD in setting of volume depletion/hypotension/Ace-inhibition/NSAIDs/vanc toxicity. Continue to hold vanco and renal dose medication.  1. Resumed IV fluids, should continue until po  improves 2. Kidney function plateaued 3. High risk of further renal damage with surgery at this time so would defer if possible but this may be difficult given his progressive malnutrition and weakness 2. Anemia- normocytic-?GIB vs due to malignancy  3. HTN- stable, cont to hold ACE 4. Encephalopathy-MRI brain negative, likely delirium 5. N/V/D/protein malnutrition- per GI  6. Colon mass- circumferential ascending and smaller descending colon masses, surgery consulted and will likely remove both early next week  Beverlyn Roux   Renal Attending: Pt has AKI and has plateaud.  He is not eating much. We would like to optimize renal function prior to laparotomy as additional insults might force need for dialysis.  We will hydrate to see if we can optimize renal function preop.  I did discuss with the wife and son the potential risk of dialysis. Nason Conradt C

## 2014-03-04 DIAGNOSIS — R319 Hematuria, unspecified: Secondary | ICD-10-CM

## 2014-03-04 LAB — BASIC METABOLIC PANEL
Anion gap: 13 (ref 5–15)
BUN: 28 mg/dL — AB (ref 6–23)
CHLORIDE: 102 meq/L (ref 96–112)
CO2: 27 mEq/L (ref 19–32)
Calcium: 8.2 mg/dL — ABNORMAL LOW (ref 8.4–10.5)
Creatinine, Ser: 3.94 mg/dL — ABNORMAL HIGH (ref 0.50–1.35)
GFR calc non Af Amer: 14 mL/min — ABNORMAL LOW (ref >90)
GFR, EST AFRICAN AMERICAN: 16 mL/min — AB (ref >90)
Glucose, Bld: 121 mg/dL — ABNORMAL HIGH (ref 70–99)
POTASSIUM: 3 meq/L — AB (ref 3.7–5.3)
Sodium: 142 mEq/L (ref 137–147)

## 2014-03-04 LAB — CBC
HEMATOCRIT: 25.4 % — AB (ref 39.0–52.0)
Hemoglobin: 8.2 g/dL — ABNORMAL LOW (ref 13.0–17.0)
MCH: 25.5 pg — ABNORMAL LOW (ref 26.0–34.0)
MCHC: 32.3 g/dL (ref 30.0–36.0)
MCV: 78.9 fL (ref 78.0–100.0)
Platelets: 312 10*3/uL (ref 150–400)
RBC: 3.22 MIL/uL — AB (ref 4.22–5.81)
RDW: 15.7 % — AB (ref 11.5–15.5)
WBC: 9.5 10*3/uL (ref 4.0–10.5)

## 2014-03-04 MED ORDER — POTASSIUM CHLORIDE CRYS ER 10 MEQ PO TBCR
30.0000 meq | EXTENDED_RELEASE_TABLET | Freq: Once | ORAL | Status: AC
  Administered 2014-03-04: 30 meq via ORAL
  Filled 2014-03-04 (×2): qty 1

## 2014-03-04 NOTE — Progress Notes (Signed)
S: No complaints O:BP 133/59  Pulse 52  Temp(Src) 98.4 F (36.9 Little) (Oral)  Resp 18  Ht 6' (1.829 m)  Wt 178 lb 14.4 oz (81.149 kg)  BMI 24.26 kg/m2  SpO2 92%  Intake/Output Summary (Last 24 hours) at 03/04/14 0739 Last data filed at 03/04/14 0700  Gross per 24 hour  Intake   1560 ml  Output    751 ml  Net    809 ml   Intake/Output: I/O last 3 completed shifts: In: 1560 [P.O.:360; I.V.:1200] Out: 1201 [Urine:1200; Stool:1]  Intake/Output this shift:    Weight change: 3 lb 3.1 oz (1.448 kg) Gen:NAD, thin elderly gentleman, lying in bed CVS:RRR, no MRG Resp:CTAB ZJQ:BHAL, NTND, +BS Ext:WWP, no LE edema   Recent Labs Lab 02/26/14 0920 02/27/14 0404 02/28/14 0854 03/01/14 0448 03/01/14 2000 03/02/14 0345 03/03/14 0252 03/04/14 0407  NA 139 140 139 142  --  141 144 142  K 4.0 4.0 3.9 3.8  --  3.4* 3.5* 3.0*  CL 105 107 102 100  --  98 103 102  CO2 17* 16* 24 29  --  27 26 27   GLUCOSE 105* 83 115* 95  --  82 89 121*  BUN 28* 32* 32* 31*  --  31* 31* 28*  CREATININE 3.92* 4.11* 3.91* 4.02*  --  4.12* 4.06* 3.94*  ALBUMIN  --  2.1*  2.1* 1.9* 1.7* 1.9* 2.0*  2.0*  --   --   CALCIUM 8.3* 8.3* 8.1* 7.9*  --  8.2* 8.4 8.2*  PHOS  --  4.0 3.5 3.5  --  4.9*  --   --   AST  --  33  --   --  22 20  --   --   ALT  --  31  --   --  18 17  --   --    Liver Function Tests:  Recent Labs Lab 02/27/14 0404  03/01/14 0448 03/01/14 2000 03/02/14 0345  AST 33  --   --  22 20  ALT 31  --   --  18 17  ALKPHOS 166*  --   --  127* 125*  BILITOT 0.5  --   --  0.4 0.4  PROT 6.1  --   --  5.5* 5.5*  ALBUMIN 2.1*  2.1*  < > 1.7* 1.9* 2.0*  2.0*  < > = values in this interval not displayed. No results found for this basename: LIPASE, AMYLASE,  in the last 168 hours No results found for this basename: AMMONIA,  in the last 168 hours CBC:  Recent Labs Lab 02/28/14 1937 03/01/14 0448 03/02/14 0345 03/03/14 0252 03/04/14 0407  WBC 9.1 8.4 9.1 10.6* 9.5  HGB 8.0* 7.8*  8.2* 8.1* 8.2*  HCT 23.9* 23.6* 26.1* 24.6* 25.4*  MCV 76.8* 78.1 79.3 79.1 78.9  PLT 236 224 251 292 312   Cardiac Enzymes:  Recent Labs Lab 02/25/14 1537  CKTOTAL 22   CBG: No results found for this basename: GLUCAP,  in the last 168 hours  Iron Studies:  No results found for this basename: IRON, TIBC, TRANSFERRIN, FERRITIN,  in the last 72 hours Studies/Results: No results found. Marland Kitchen amLODipine  5 mg Oral Daily  . Influenza vac split quadrivalent PF  0.5 mL Intramuscular Tomorrow-1000  . pneumococcal 23 valent vaccine  0.5 mL Intramuscular Tomorrow-1000  . potassium chloride  30 mEq Oral Once  . terazosin  5 mg Oral Daily    Assessment/Plan:  73yo man who presented with 1 month FTT found to have a liver abscess and colon masses.  1. AKI/CKD in setting of volume depletion/hypotension/Ace-inhibition/NSAIDs/vanc toxicity. Continue to hold vanco and renal dose medication.  1. Continue IV fluids until po improves 2. Kidney function plateaued 3. High risk of further renal damage with surgery at this time so would defer if possible but this may be difficult given his progressive malnutrition and weakness 2. Hematuria - repeat UA, UCx, if persistent will get urology consult tomorrow as this may represent metastatic disease 3. Anemia- normocytic-?GIB vs due to malignancy  4. HTN- stable, cont to hold ACE 5. Encephalopathy-MRI brain negative, likely delirium 6. N/V/D/protein malnutrition- per GI and primary  7. Colon mass- circumferential ascending and smaller descending colon masses, surgery consulted and will likely remove both early next week vs. Postponing for a few weeks  Brent Little  Renal Attending: AKI superimposed upon CKD 2 baseline at creat 1.2mg /dl.  He had about of gross hematuria in hospital with prior urine in house not representing any blood.  He has no pain.  UA TNTC RBCs. He has renal cysts.  I believe he will need urologic evaluation with cystoscopy to rule out any  possible metastatic involvement before surgery.  Renal function has improved with hydration and we will continue to support.  He is not eating well.  Would K replace and delay surgery until renal function closer to baseline. Brent Little

## 2014-03-04 NOTE — Progress Notes (Addendum)
2 Days Post-Op  Subjective: Feels weak, wasn't eating much 3 weeks prior to this, passing flatus, no brbpr  Objective: Vital signs in last 24 hours: Temp:  [98.4 F (36.9 C)-99.1 F (37.3 C)] 98.4 F (36.9 C) (10/25 0540) Pulse Rate:  [52-79] 52 (10/25 0540) Resp:  [17-19] 18 (10/25 0540) BP: (133-158)/(50-59) 133/59 mmHg (10/25 0540) SpO2:  [87 %-93 %] 92 % (10/25 0540) Weight:  [178 lb 14.4 oz (81.149 kg)] 178 lb 14.4 oz (81.149 kg) (10/25 0540) Last BM Date: 03/03/14  Intake/Output from previous day: 10/24 0701 - 10/25 0700 In: 1560 [P.O.:360; I.V.:1200] Out: 751 [Urine:750; Stool:1] Intake/Output this shift:    GI: soft nondistended nontender  Lab Results:   Recent Labs  03/03/14 0252 03/04/14 0407  WBC 10.6* 9.5  HGB 8.1* 8.2*  HCT 24.6* 25.4*  PLT 292 312   BMET  Recent Labs  03/03/14 0252 03/04/14 0407  NA 144 142  K 3.5* 3.0*  CL 103 102  CO2 26 27  GLUCOSE 89 121*  BUN 31* 28*  CREATININE 4.06* 3.94*  CALCIUM 8.4 8.2*   PT/INR No results found for this basename: LABPROT, INR,  in the last 72 hours ABG No results found for this basename: PHART, PCO2, PO2, HCO3,  in the last 72 hours  Studies/Results: No results found.  Anti-infectives: Anti-infectives   Start     Dose/Rate Route Frequency Ordered Stop   02/26/14 1600  piperacillin-tazobactam (ZOSYN) IVPB 2.25 g  Status:  Discontinued     2.25 g 100 mL/hr over 30 Minutes Intravenous 3 times per day 02/26/14 1222 02/26/14 1849   02/18/14 1800  vancomycin (VANCOCIN) IVPB 750 mg/150 ml premix  Status:  Discontinued     750 mg 150 mL/hr over 60 Minutes Intravenous Every 12 hours 02/18/14 1726 02/24/14 1650   02/16/14 0000  piperacillin-tazobactam (ZOSYN) IVPB 3.375 g  Status:  Discontinued     3.375 g 12.5 mL/hr over 240 Minutes Intravenous 3 times per day 02/15/14 1516 02/26/14 1222   02/15/14 1600  vancomycin (VANCOCIN) IVPB 1000 mg/200 mL premix  Status:  Discontinued     1,000 mg 200  mL/hr over 60 Minutes Intravenous Every 12 hours 02/15/14 1516 02/18/14 1726   02/15/14 1600  piperacillin-tazobactam (ZOSYN) IVPB 3.375 g     3.375 g 100 mL/hr over 30 Minutes Intravenous  Once 02/15/14 1516 02/15/14 1900      Assessment/Plan: 1. Synchronous colonic lesions, likely malignant, biopsies pending  2. Recent liver abscess, resolved s/p perc drain  3. HTN  4. Confusion/acute encephalopathy- improved  5. Anemia  6. ARF, Cr remaining around 4  7. PCM  I think best plan if able to do would be to optimize his nutrition (ntn consult and supplements would be good) as well as stabilize his renal function. He does not appear to be in urgent need of colectomy and his best chance for recovery and renal function might even be to give this a week or two.  Rehab may be possibility to maximize him preop.  Will see how he is doing tomorrow.He is very deconditioned.  I would be hesitant to consider primary anastomosis right now given albumin. Recommend urology consult for hematuria    [facility] 03/04/2014

## 2014-03-04 NOTE — Progress Notes (Signed)
[facility] PROGRESS NOTE  Brent Little CZY:[id number] DOB: 1940/09/20 DOA: 02/15/2014  PCP: [name], MD  Brief Narrative: Brent Little is a 73 y.o. male, With H/O HTN, Kidney stone, who initially presented to [place] with fever, dull right upper quadrant pain and a dry cough. He was diagnosed there with a possible community-acquired pneumonia, was placed on Levaquin, he had marginal improvement. During his fever workup he had a right upper quadrant ultrasound which showed some nonspecific changes . He was then discharged home with outpatient MRI. MRI done in the outpatient setting showed a septated cystic lesion in the right hepatic lobe concerning for abscess versus metastatic necrotic mass. MRI also showed an ascending colon circumferential mass. He presented to [facility] with fever, chills, decreased appetite and lethargy. He was followed by CCS/GI and IR. He has been on broad spectrum Abx, finally underwent Liver biopsy 10/12. And had a drain put it. His creatinine started creeping up on 10/16. Vancomycin trough came back elevated. Stopped vancomycin and Nephrology was consulted. His blood cultures and cultures from the liver abscess have all come back negative and zosyn was discontinued on 10/19 after completing 10 days of IV antibiotics. Then he had minimal rectal bleeding. GI was reconsulted and he underwent colonoscopy 10/23 which revealed colon cancer.  Assessment/Plan:  Liver abscess vs necrotic mets He underwent US guided biopsy 10/12; yielded purulent appearing material. 93F drain was placed under US guidance. Culture and gram stain were sent. No organisms seen. Blood cultures are  negative. Cytology was negative for malignant cells. He completed 10 days of IV antibiotics. Vancomycin was stopped on 10/16 after the trough came back elevated. A repeat CT abdomen and pelvis without contrast showed decrease in the size of the collection. Drain removed 10/21.   Colon  Cancer/Hematochezia GI consulted for colonoscopy. They initially recommended outpatient colonoscopy when he recovers from the liver issue. However he developed mild rectal bleeding. Colonoscopy done on 10/23 which revealed colon cancer. CEA mildly elevated at 9. No further blood in stool. Surgery was consulted but it is felt that if his renal function doesn't improve by Monday it may be better to delay surgery by few weeks to give kidneys a chance to recover. Discussed with Dr. Florene Glen and Dr. Barry Dienes on 10/24. Discussed with patient's wife as well. They are all in agreement.  Acute renal failure Probably secondary to vancomycin toxicity vs ace inhibition vs NSAID use vs volume depletion. [place] was negative for hydronephrosis. UA is negative. Vancomycin trough was 47.3. Nephrology following. Was given HCO3 infusion. Renal function is stable though not back to baseline. See above as well.  Nausea/Poor Appetite Likely due to acute illness, ARF. US shows gallstones but no acute inflammation. LFT's normal. Continue to monitor.  Essential HTN BP fluctuates but stable. ACE on hold  Iron Deficiency Anemia Hgb noted to be lower but stable. Monitor for now.   Dysuria/Hematuria Urine was reddish appearing. UA is abnormal. But now urine has cleared up. Will repeat UA. He is noted to have renal cysts and enlarged prostate on [place]. This could have contributed. Discussed with Dr. Alinda Money with Urology and he recommends OP evaluation for same especially as hematuria seems top have been transient.   Confusion/Acute Encephalopathy Initially thought to be secondary to narcotic pain medications. These were stopped. MRI brian was done due to persistent symptoms. MRI was negative. Stable with occasional periods of confusion. Possibly due to hospital stay. Patient likely has underlying cognitive impairment. Also stopped Benadryl which  could have contributed as well.  Sinus Bradycardia Asymptomatic. Not on rate limiting  medications.  DVT proph: SCD's Code Status: Full Code Family Communication: Discussed with patient and wife at bedside Disposition Plan: SNF recommended by PT. If surgery is delayed, he could go to SNF on Monday/Tuesday which appears more likely.  Procedures IR placed drain in liver lesion.  Colonoscopy 10/23 Synchronous colon cancers in the ascending and descending colon    Consultants:  GI  CCS  IR  Subjective: Patient confused but pleasant. Denies any pain.   Objective: Filed Vitals:   03/04/14 0540  BP: 133/59  Pulse: 52  Temp: 98.4 F (36.9 C)  Resp: 18    Intake/Output Summary (Last 24 hours) at 03/04/14 0757 Last data filed at 03/04/14 0700  Gross per 24 hour  Intake   1560 ml  Output    751 ml  Net    809 ml   Filed Weights   03/02/14 0515 03/03/14 0558 03/04/14 0540  Weight: 79.289 kg (174 lb 12.8 oz) 79.7 kg (175 lb 11.3 oz) 81.149 kg (178 lb 14.4 oz)    Exam:   General:  alert and conversant, but confused..   Cardiovascular: S1S2/RRR  Respiratory: CTA bilaterally  Abdomen: soft, mild tenderness in RUQ, BS present. Drain removed.  Musculoskeletal: no edema  Data Reviewed: Basic Metabolic Panel:  Recent Labs Lab 02/26/14 0920 02/27/14 0404 02/28/14 8938 03/01/14 0448 03/02/14 0345 03/03/14 0252 03/04/14 0407  NA 139 140 139 142 141 144 142  K 4.0 4.0 3.9 3.8 3.4* 3.5* 3.0*  CL 105 107 102 100 98 103 102  CO2 17* 16* 24 29 27 26 27   GLUCOSE 105* 83 115* 95 82 89 121*  BUN 28* 32* 32* 31* 31* 31* 28*  CREATININE 3.92* 4.11* 3.91* 4.02* 4.12* 4.06* 3.94*  CALCIUM 8.3* 8.3* 8.1* 7.9* 8.2* 8.4 8.2*  PHOS  --  4.0 3.5 3.5 4.9*  --   --    Liver Function Tests:  Recent Labs Lab 02/27/14 0404 02/28/14 0854 03/01/14 0448 03/01/14 2000 03/02/14 0345  AST 33  --   --  22 20  ALT 31  --   --  18 17  ALKPHOS 166*  --   --  127* 125*  BILITOT 0.5  --   --  0.4 0.4  PROT 6.1  --   --  5.5* 5.5*  ALBUMIN 2.1*  2.1* 1.9* 1.7*  1.9* 2.0*  2.0*   CBC:  Recent Labs Lab 02/28/14 1937 03/01/14 0448 03/02/14 0345 03/03/14 0252 03/04/14 0407  WBC 9.1 8.4 9.1 10.6* 9.5  HGB 8.0* 7.8* 8.2* 8.1* 8.2*  HCT 23.9* 23.6* 26.1* 24.6* 25.4*  MCV 76.8* 78.1 79.3 79.1 78.9  PLT 236 224 251 292 312   Cardiac Enzymes:  Recent Labs Lab 02/25/14 1537  CKTOTAL 22     Recent Results (from the past 240 hour(s))  CLOSTRIDIUM DIFFICILE BY PCR     Status: None   Collection Time    02/25/14  2:17 PM      Result Value Ref Range Status   C difficile by pcr NEGATIVE  NEGATIVE Final     Studies: No results found.  Scheduled Meds: . amLODipine  5 mg Oral Daily  . Influenza vac split quadrivalent PF  0.5 mL Intramuscular Tomorrow-1000  . pneumococcal 23 valent vaccine  0.5 mL Intramuscular Tomorrow-1000  . potassium chloride  30 mEq Oral Once  . terazosin  5 mg Oral Daily  Continuous Infusions: . dextrose 5 % and 0.45% NaCl 100 mL/hr at 03/03/14 2252   Antibiotics Given (last 72 hours)   None      Principal Problem:   Essential hypertension Active Problems:   Liver abscess   BPH (benign prostatic hyperplasia)   Colonic mass   Acute renal failure  Time spent: 70min   Margueritte Guthridge   [facility]  Pager [phone].   If 7PM-7AM, please contact night-coverage at [email or web address], password [facility] 03/04/2014, 7:57 AM  LOS: 17 days

## 2014-03-05 ENCOUNTER — Encounter (HOSPITAL_COMMUNITY): Payer: Self-pay | Admitting: Gastroenterology

## 2014-03-05 DIAGNOSIS — R410 Disorientation, unspecified: Secondary | ICD-10-CM

## 2014-03-05 LAB — UIFE/LIGHT CHAINS/TP QN, 24-HR UR
Albumin, U: DETECTED
Alpha 1, Urine: DETECTED — AB
Alpha 2, Urine: DETECTED — AB
Beta, Urine: DETECTED — AB
GAMMA UR: DETECTED — AB
Total Protein, Urine: 27 mg/dL — ABNORMAL HIGH (ref 5–25)

## 2014-03-05 LAB — BASIC METABOLIC PANEL
Anion gap: 14 (ref 5–15)
BUN: 23 mg/dL (ref 6–23)
CHLORIDE: 102 meq/L (ref 96–112)
CO2: 25 mEq/L (ref 19–32)
Calcium: 8.2 mg/dL — ABNORMAL LOW (ref 8.4–10.5)
Creatinine, Ser: 3.78 mg/dL — ABNORMAL HIGH (ref 0.50–1.35)
GFR, EST AFRICAN AMERICAN: 17 mL/min — AB (ref >90)
GFR, EST NON AFRICAN AMERICAN: 14 mL/min — AB (ref >90)
Glucose, Bld: 111 mg/dL — ABNORMAL HIGH (ref 70–99)
Potassium: 3.2 mEq/L — ABNORMAL LOW (ref 3.7–5.3)
Sodium: 141 mEq/L (ref 137–147)

## 2014-03-05 LAB — CBC
HCT: 26.3 % — ABNORMAL LOW (ref 39.0–52.0)
Hemoglobin: 8.5 g/dL — ABNORMAL LOW (ref 13.0–17.0)
MCH: 25.7 pg — ABNORMAL LOW (ref 26.0–34.0)
MCHC: 32.3 g/dL (ref 30.0–36.0)
MCV: 79.5 fL (ref 78.0–100.0)
PLATELETS: 270 10*3/uL (ref 150–400)
RBC: 3.31 MIL/uL — ABNORMAL LOW (ref 4.22–5.81)
RDW: 16 % — AB (ref 11.5–15.5)
WBC: 9.4 10*3/uL (ref 4.0–10.5)

## 2014-03-05 LAB — CEA: CEA: 9.8 ng/mL — AB (ref 0.0–5.0)

## 2014-03-05 LAB — URINALYSIS, ROUTINE W REFLEX MICROSCOPIC
BILIRUBIN URINE: NEGATIVE
GLUCOSE, UA: NEGATIVE mg/dL
Hgb urine dipstick: NEGATIVE
KETONES UR: NEGATIVE mg/dL
Leukocytes, UA: NEGATIVE
Nitrite: NEGATIVE
Protein, ur: NEGATIVE mg/dL
Specific Gravity, Urine: 1.006 (ref 1.005–1.030)
Urobilinogen, UA: 0.2 mg/dL (ref 0.0–1.0)
pH: 7.5 (ref 5.0–8.0)

## 2014-03-05 LAB — PREALBUMIN: Prealbumin: 6.1 mg/dL — ABNORMAL LOW (ref 17.0–34.0)

## 2014-03-05 MED ORDER — ENSURE COMPLETE PO LIQD: 237.0000 mL | Freq: Two times a day (BID) | ORAL | Status: DC

## 2014-03-05 MED ORDER — ENSURE COMPLETE PO LIQD
237.0000 mL | Freq: Two times a day (BID) | ORAL | Status: DC
  Administered 2014-03-05 – 2014-03-06 (×2): 237 mL via ORAL

## 2014-03-05 MED ORDER — BOOST / RESOURCE BREEZE PO LIQD: 1.0000 | Freq: Two times a day (BID) | ORAL | Status: DC

## 2014-03-05 MED ORDER — BOOST / RESOURCE BREEZE PO LIQD
1.0000 | Freq: Three times a day (TID) | ORAL | Status: DC
  Administered 2014-03-05: 1 via ORAL

## 2014-03-05 MED ORDER — POTASSIUM CHLORIDE CRYS ER 20 MEQ PO TBCR
40.0000 meq | EXTENDED_RELEASE_TABLET | Freq: Once | ORAL | Status: AC
  Administered 2014-03-05: 40 meq via ORAL
  Filled 2014-03-05: qty 2

## 2014-03-05 NOTE — Progress Notes (Signed)
S: No complaints O:BP 132/43  Pulse 59  Temp(Src) 98.1 F (36.7 C) (Oral)  Resp 18  Ht 6' (1.829 m)  Wt 179 lb 3.7 oz (81.3 kg)  BMI 24.30 kg/m2  SpO2 93%  Intake/Output Summary (Last 24 hours) at 03/05/14 0736 Last data filed at 03/04/14 1900  Gross per 24 hour  Intake   1520 ml  Output    350 ml  Net   1170 ml   Intake/Output: I/O last 3 completed shifts: In: 2840 [P.O.:440; I.V.:2400] Out: 901 [Urine:900; Stool:1]  Intake/Output this shift:    Weight change: 5.3 oz (0.151 kg) Gen:NAD, thin elderly gentleman, lying in bed Pale CVS:RRR, no MRG Gr2/6 M  1+ edema Resp:CTAB decreased bs TFT:DDUK, NTND, +BS drains Ext:WWP, no LE edema   Recent Labs Lab 02/26/14 0920 02/27/14 0404 02/28/14 0254 03/01/14 0448 03/01/14 2000 03/02/14 0345 03/03/14 0252 03/04/14 0407 03/05/14 0320  NA 139 140 139 142  --  141 144 142 141  K 4.0 4.0 3.9 3.8  --  3.4* 3.5* 3.0* 3.2*  CL 105 107 102 100  --  98 103 102 102  CO2 17* 16* 24 29  --  27 26 27 25   GLUCOSE 105* 83 115* 95  --  82 89 121* 111*  BUN 28* 32* 32* 31*  --  31* 31* 28* 23  CREATININE 3.92* 4.11* 3.91* 4.02*  --  4.12* 4.06* 3.94* 3.78*  ALBUMIN  --  2.1*  2.1* 1.9* 1.7* 1.9* 2.0*  2.0*  --   --   --   CALCIUM 8.3* 8.3* 8.1* 7.9*  --  8.2* 8.4 8.2* 8.2*  PHOS  --  4.0 3.5 3.5  --  4.9*  --   --   --   AST  --  33  --   --  22 20  --   --   --   ALT  --  31  --   --  18 17  --   --   --    Liver Function Tests:  Recent Labs Lab 02/27/14 0404  03/01/14 0448 03/01/14 2000 03/02/14 0345  AST 33  --   --  22 20  ALT 31  --   --  18 17  ALKPHOS 166*  --   --  127* 125*  BILITOT 0.5  --   --  0.4 0.4  PROT 6.1  --   --  5.5* 5.5*  ALBUMIN 2.1*  2.1*  < > 1.7* 1.9* 2.0*  2.0*  < > = values in this interval not displayed. No results found for this basename: LIPASE, AMYLASE,  in the last 168 hours No results found for this basename: AMMONIA,  in the last 168 hours CBC:  Recent Labs Lab 03/01/14 0448  03/02/14 0345 03/03/14 0252 03/04/14 0407 03/05/14 0320  WBC 8.4 9.1 10.6* 9.5 9.4  HGB 7.8* 8.2* 8.1* 8.2* 8.5*  HCT 23.6* 26.1* 24.6* 25.4* 26.3*  MCV 78.1 79.3 79.1 78.9 79.5  PLT 224 251 292 312 270   Cardiac Enzymes: No results found for this basename: CKTOTAL, CKMB, CKMBINDEX, TROPONINI,  in the last 168 hours CBG: No results found for this basename: GLUCAP,  in the last 168 hours  Iron Studies:  No results found for this basename: IRON, TIBC, TRANSFERRIN, FERRITIN,  in the last 72 hours Studies/Results: No results found. Marland Kitchen amLODipine  5 mg Oral Daily  . Influenza vac split quadrivalent PF  0.5 mL Intramuscular  Tomorrow-1000  . pneumococcal 23 valent vaccine  0.5 mL Intramuscular Tomorrow-1000  . terazosin  5 mg Oral Daily    Assessment/Plan: 73yo man who presented with 1 month FTT found to have a liver abscess and colon masses.  1. AKI/CKD in setting of volume depletion/hypotension/Ace-inhibition/NSAIDs/vanc toxicity. Continue to hold vanco and renal dose medication.  1. Continue IV fluids until po improves 2. Kidney function Slowly better by trend.  Little GFR change 3. High risk of further renal damage with surgery at this time so would defer if possible and higher morbidity and mortality in setting AKI 2. Hematuria - repeat UA, UCx, if persistent would recommend urology consult as this may represent metastatic disease Rec Urology eval 3. Anemia- normocytic-?GIB vs due to malignancy  4. HTN- stable, cont to hold ACE 5. Encephalopathy-MRI brain negative, likely delirium 6. N/V/D/protein malnutrition- per GI and primary  One of primary issues discussed with surgery and primary MD 7. Colon mass- circumferential ascending and smaller descending colon masses, surgery consulted and will defer 1-2 weeks until strength improved with nutrition and rehab  Beverlyn Roux I have seen and examined this patient and agree with the plan of care seen, eval, discussed with resident,  primary MD, and surgery.  Very frail and discussed goals with wife.  Nareg Breighner,Christain L 03/05/2014, 1:58 PM

## 2014-03-05 NOTE — Progress Notes (Signed)
I agree with the above statement of the PA.  Nutritional support prior to surgery is important.  Kathryne Eriksson. Dahlia Bailiff, MD, [place] [phone] [facility]

## 2014-03-05 NOTE — Progress Notes (Signed)
Rehab Admissions Coordinator Note:  Patient was screened by Cleatrice Burke for appropriateness for an Inpatient Acute Rehab Consult per PT recommendation today. At this time, we are recommending clarifying with family their wishes for rehab venue. They previously discussed with SW their preference to be closer to home for his rehab as they live in [place]. If they would like to pursue rehab here at [facility], then place order for an inpt rehab consult.  Cleatrice Burke 03/05/2014, 1:40 PM  I can be reached at [phone].

## 2014-03-05 NOTE — Progress Notes (Signed)
[facility] PROGRESS NOTE  Brent Little GXQ:[id number] DOB: October 26, 1940 DOA: 02/15/2014  PCP: [name], MD  Brief Narrative: Brent Little is a 73 y.o. male, With H/O HTN, Kidney stone, who initially presented to [place] with fever, dull right upper quadrant pain and a dry cough. He was diagnosed there with a possible community-acquired pneumonia, was placed on Levaquin, he had marginal improvement. During his fever workup he had a right upper quadrant ultrasound which showed some nonspecific changes . He was then discharged home with outpatient MRI. MRI done in the outpatient setting showed a septated cystic lesion in the right hepatic lobe concerning for abscess versus metastatic necrotic mass. MRI also showed an ascending colon circumferential mass. He presented to [facility] with fever, chills, decreased appetite and lethargy. He was followed by CCS/GI and IR. He has been on broad spectrum Abx, finally underwent Liver biopsy 10/12. And had a drain put it. His creatinine started creeping up on 10/16. Vancomycin trough came back elevated. Stopped vancomycin and Nephrology was consulted. His blood cultures and cultures from the liver abscess have all come back negative and zosyn was discontinued on 10/19 after completing 10 days of IV antibiotics. Then he had minimal rectal bleeding. GI was reconsulted and he underwent colonoscopy 10/23 which revealed colon cancer. His renal function is slowly improving. Plan is for patient's nutritional status to improve prior to undergoing surgery.  Assessment/Plan:  Colon Cancer/Hematochezia GI consulted for colonoscopy. They initially recommended outpatient colonoscopy when he recovers from the liver issue. However he developed mild rectal bleeding. Colonoscopy done on 10/23 which revealed colon cancer. CEA mildly elevated at 9. No further blood in stool. Surgery was consulted. It is felt that his impaired renal function could get worse if surgery is done  now. Plus patient's nutritional status needs to be better as well. Patient might have to go to SNF for few weeks to improve his functional status. Calorie count today. Diet has been advanced. May need to pursue alternative modes of nutrition if he doesn't eat well.   Acute renal failure Probably secondary to vancomycin toxicity vs ace inhibition vs NSAID use vs volume depletion. [place] was negative for hydronephrosis. UA is negative. Vancomycin trough was 47.3. Nephrology following. Was given HCO3 infusion. Renal function is improving though not back to baseline.   Liver abscess vs necrotic mets He underwent US guided biopsy 10/12; yielded purulent appearing material. 27F drain was placed under US guidance. Culture and gram stain were sent. No organisms seen. Blood cultures are  negative. Cytology was negative for malignant cells. He completed 10 days of IV antibiotics. Vancomycin was stopped on 10/16 after the trough came back elevated. A repeat CT abdomen and pelvis without contrast showed decrease in the size of the collection. Drain removed 10/21.   Nausea/Poor Appetite Likely due to acute illness, ARF. US shows gallstones but no acute inflammation. LFT's normal. Diet advanced. Calorie count today.   Essential HTN BP fluctuates but stable. ACE on hold  Iron Deficiency Anemia Hgb noted to be lower but stable. Monitor for now.   Dysuria/Hematuria He had transient hematuria. Now resolved. He is noted to have renal cysts and enlarged prostate on [place]. These could have contributed. Discussed with Dr. Alinda Money with Urology on 10/25 and he recommends OP evaluation for same especially as hematuria seems to have been transient.   Confusion/Acute Encephalopathy Initially thought to be secondary to narcotic pain medications. These were stopped. MRI brian was done due to persistent symptoms. MRI was  negative. Stable with occasional periods of confusion. Possibly due to hospital stay. Patient likely has  underlying cognitive impairment. Also stopped Benadryl which could have contributed as well.  Sinus Bradycardia Asymptomatic. Not on rate limiting medications.  DVT proph: SCD's Code Status: Full Code Family Communication: Discussed with patient and wife at bedside Disposition Plan: SNF eventually but need to address poor nutrition.   Procedures IR placed drain in liver lesion.  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 Denies any pain.   Objective: Filed Vitals:   03/05/14 0342  BP: 132/43  Pulse: 59  Temp: 98.1 F (36.7 C)  Resp: 18    Intake/Output Summary (Last 24 hours) at 03/05/14 0930 Last data filed at 03/05/14 0724  Gross per 24 hour  Intake 1178.33 ml  Output    525 ml  Net 653.33 ml   Filed Weights   03/03/14 0558 03/04/14 0540 03/05/14 0342  Weight: 79.7 kg (175 lb 11.3 oz) 81.149 kg (178 lb 14.4 oz) 81.3 kg (179 lb 3.7 oz)    Exam:   General:  alert and conversant, but confused. Recognized his wife. Knew he was in [place]. Couldn't tell me the date, month, year.  Cardiovascular: S1S2/RRR  Respiratory: CTA bilaterally  Abdomen: soft, mild tenderness in RUQ, BS present. Drain removed.  Musculoskeletal: no edema  Data Reviewed: Basic Metabolic Panel:  Recent Labs Lab 02/27/14 0404 02/28/14 3154 03/01/14 0448 03/02/14 0345 03/03/14 0252 03/04/14 0407 03/05/14 0320  NA 140 139 142 141 144 142 141  K 4.0 3.9 3.8 3.4* 3.5* 3.0* 3.2*  CL 107 102 100 98 103 102 102  CO2 16* 24 29 27 26 27 25   GLUCOSE 83 115* 95 82 89 121* 111*  BUN 32* 32* 31* 31* 31* 28* 23  CREATININE 4.11* 3.91* 4.02* 4.12* 4.06* 3.94* 3.78*  CALCIUM 8.3* 8.1* 7.9* 8.2* 8.4 8.2* 8.2*  PHOS 4.0 3.5 3.5 4.9*  --   --   --    Liver Function Tests:  Recent Labs Lab 02/27/14 0404 02/28/14 0854 03/01/14 0448  03/01/14 2000 03/02/14 0345  AST 33  --   --  22 20  ALT 31  --   --  18 17  ALKPHOS 166*  --   --  127* 125*  BILITOT 0.5  --   --  0.4 0.4  PROT 6.1  --   --  5.5* 5.5*  ALBUMIN 2.1*  2.1* 1.9* 1.7* 1.9* 2.0*  2.0*   CBC:  Recent Labs Lab 03/01/14 0448 03/02/14 0345 03/03/14 0252 03/04/14 0407 03/05/14 0320  WBC 8.4 9.1 10.6* 9.5 9.4  HGB 7.8* 8.2* 8.1* 8.2* 8.5*  HCT 23.6* 26.1* 24.6* 25.4* 26.3*  MCV 78.1 79.3 79.1 78.9 79.5  PLT 224 251 292 312 270   Cardiac Enzymes: No results found for this basename: CKTOTAL, CKMB, CKMBINDEX, TROPONINI,  in the last 168 hours   Recent Results (from the past 240 hour(s))  CLOSTRIDIUM DIFFICILE BY PCR     Status: None   Collection Time    02/25/14  2:17 PM      Result Value Ref Range Status   C difficile by pcr NEGATIVE  NEGATIVE Final     Studies: No results found.  Scheduled Meds: . feeding supplement (RESOURCE BREEZE)  1 Container Oral TID BM  . Influenza vac split quadrivalent  PF  0.5 mL Intramuscular Tomorrow-1000  . pneumococcal 23 valent vaccine  0.5 mL Intramuscular Tomorrow-1000  . potassium chloride  40 mEq Oral Once  . terazosin  5 mg Oral Daily   Continuous Infusions: . dextrose 5 % and 0.45% NaCl 100 mL/hr at 03/05/14 0724   Antibiotics Given (last 72 hours)   None      Principal Problem:   Essential hypertension Active Problems:   Liver abscess   BPH (benign prostatic hyperplasia)   Colonic mass   Acute renal failure   Hematuria  Time spent: 87min   Shakeem Stern   [facility]  Pager [phone].   If 7PM-7AM, please contact night-coverage at [email or web address], password [facility] 03/05/2014, 9:30 AM  LOS: 18 days

## 2014-03-05 NOTE — Progress Notes (Signed)
INITIAL NUTRITION ASSESSMENT  DOCUMENTATION CODES Per approved criteria  -Not Applicable   INTERVENTION: - Diet will be downgraded to Dysphagia III - Resource Breeze po BID, each supplement provides 250 kcal and 9 grams of protein - Ensure Complete po BID, each supplement provides 350 kcal and 13 grams of protein - 24-hour calorie count ordered. RD to follow-up tomorrow.  NUTRITION DIAGNOSIS: Inadequate oral intake related to ascending colon mass as evidenced by poor po.   Goal: Pt to meet >/= 90% of their estimated nutrition needs   Monitor:  Calorie count, labs, weight trend, need for nutrition by alternative route  Reason for Assessment: Calorie Count  73 y.o. male  Admitting Dx: Essential hypertension  ASSESSMENT: 73 y.o. male, With H/O HTN, Kidney stone, who initially presented to [place] with fever, dull right upper quadrant pain and a dry cough. He was diagnosed there with a possible community-acquired pneumonia, was placed on Levaquin, he had marginal improvement. During his fever workup he had a right upper quadrant ultrasound which showed some nonspecific changes . He was then discharged home with outpatient MRI. MRI done in the outpatient setting showed a septated cystic lesion in the right hepatic lobe concerning for abscess versus metastatic necrotic mass. MRI also showed an ascending colon circumferential mass. He presented to [facility] with fever, chills, decreased appetite and lethargy.  - Blood cultures and liver abscess cultures came back negative.  - Colonoscopy 10/23 revealed colon cancer. Plan is for patient's nutritional status to improve prior to undergoing surgery.  - 24-hour calorie count ordered by MD. Current diet is Regular. Will be downgraded to Dysphagia III per wife request due to poor dentition.  - Pt with no signs of fat or muscle wasting at this time.  - Wife said that pt was eating poorly last week, but po intake has improved due to  encouragement. He is drinking [employer]. He ate about 30% of his lunch tray today.  10/26 Lunch: Chocolate pudding cake (100 kcal, 2 g protein) Yeast roll (60 kcal, 1 g protein) Whipped potatoes (50 kcal, 1 g protein) Supplements: Resource Breeze po (250 kcal and 9 grams of protein)  Total: 460 kcal and 13 g protein  Labs: Na WNL K low BUN WNL  Height: Ht Readings from Last 1 Encounters:  02/15/14 6' (1.829 m)    Weight: Wt Readings from Last 1 Encounters:  03/05/14 179 lb 3.7 oz (81.3 kg)    Ideal Body Weight: 80.9 kg  % Ideal Body Weight: 100%  Wt Readings from Last 10 Encounters:  03/05/14 179 lb 3.7 oz (81.3 kg)  03/05/14 179 lb 3.7 oz (81.3 kg)   BMI:  Body mass index is 24.3 kg/(m^2).  Estimated Nutritional Needs: Kcal: 2100-2300 Protein: 105-120 g Fluid: 2.1-2.3 L/day  Skin: Intact  Diet Order: General  EDUCATION NEEDS: -Education needs addressed   Intake/Output Summary (Last 24 hours) at 03/05/14 1420 Last data filed at 03/05/14 0724  Gross per 24 hour  Intake    620 ml  Output    350 ml  Net    270 ml    Last BM: 10/26   Labs:   Recent Labs Lab 02/28/14 0854 03/01/14 0448 03/02/14 0345 03/03/14 0252 03/04/14 0407 03/05/14 0320  NA 139 142 141 144 142 141  K 3.9 3.8 3.4* 3.5* 3.0* 3.2*  CL 102 100 98 103 102 102  CO2 24 29 27 26 27 25   BUN 32* 31* 31* 31* 28*  23  CREATININE 3.91* 4.02* 4.12* 4.06* 3.94* 3.78*  CALCIUM 8.1* 7.9* 8.2* 8.4 8.2* 8.2*  PHOS 3.5 3.5 4.9*  --   --   --   GLUCOSE 115* 95 82 89 121* 111*    CBG (last 3)  No results found for this basename: GLUCAP,  in the last 72 hours  Scheduled Meds: . feeding supplement (RESOURCE BREEZE)  1 Container Oral TID BM  . Influenza vac split quadrivalent PF  0.5 mL Intramuscular Tomorrow-1000  . pneumococcal 23 valent vaccine  0.5 mL Intramuscular Tomorrow-1000  . terazosin  5 mg Oral Daily    Continuous Infusions: . dextrose 5 % and 0.45%  NaCl 100 mL/hr at 03/05/14 9147    Past Medical History  Diagnosis Date  . Hypertension   . Acute renal failure 02/24/2014    Past Surgical History  Procedure Laterality Date  . Tonsillectomy    . Colonoscopy with propofol N/A 03/02/2014    Procedure: COLONOSCOPY WITH PROPOFOL;  Surgeon: Missy Sabins, MD;  Location: [facility];  Service: Endoscopy;  Laterality: N/A;    Laurette Schimke RD, LDN

## 2014-03-05 NOTE — Progress Notes (Signed)
Patient ID: Brent Little, male   DOB: May 24, 1940, 73 y.o.   MRN: [id number] 3 Days Post-Op  Subjective: Pt without complaints.  Wife states he won't eat anything.    Objective: Vital signs in last 24 hours: Temp:  [98.1 F (36.7 C)-98.7 F (37.1 C)] 98.1 F (36.7 C) (10/26 0342) Pulse Rate:  [57-59] 59 (10/26 0342) Resp:  [17-18] 18 (10/26 0342) BP: (132-134)/(43-50) 132/43 mmHg (10/26 0342) SpO2:  [91 %-93 %] 93 % (10/26 0342) Weight:  [179 lb 3.7 oz (81.3 kg)] 179 lb 3.7 oz (81.3 kg) (10/26 0342) Last BM Date: 03/05/14  Intake/Output from previous day: 10/25 0701 - 10/26 0700 In: 1520 [P.O.:320; I.V.:1200] Out: 350 [Urine:350] Intake/Output this shift: Total I/O In: -  Out: 250 [Urine:250]  PE: Abd: soft, NT, ND, +BS Heart: regular Lungs: CTAB  Lab Results:   Recent Labs  03/04/14 0407 03/05/14 0320  WBC 9.5 9.4  HGB 8.2* 8.5*  HCT 25.4* 26.3*  PLT 312 270   BMET  Recent Labs  03/04/14 0407 03/05/14 0320  NA 142 141  K 3.0* 3.2*  CL 102 102  CO2 27 25  GLUCOSE 121* 111*  BUN 28* 23  CREATININE 3.94* 3.78*  CALCIUM 8.2* 8.2*   PT/INR No results found for this basename: LABPROT, INR,  in the last 72 hours CMP     Component Value Date/Time   NA 141 03/05/2014 0320   K 3.2* 03/05/2014 0320   CL 102 03/05/2014 0320   CO2 25 03/05/2014 0320   GLUCOSE 111* 03/05/2014 0320   BUN 23 03/05/2014 0320   CREATININE 3.78* 03/05/2014 0320   CALCIUM 8.2* 03/05/2014 0320   PROT 5.5* 03/02/2014 0345   ALBUMIN 2.0* 03/02/2014 0345   ALBUMIN 2.0* 03/02/2014 0345   AST 20 03/02/2014 0345   ALT 17 03/02/2014 0345   ALKPHOS 125* 03/02/2014 0345   BILITOT 0.4 03/02/2014 0345   GFRNONAA 14* 03/05/2014 0320   GFRAA 17* 03/05/2014 0320   Lipase  No results found for this basename: lipase       Studies/Results: No results found.  Anti-infectives: Anti-infectives   Start     Dose/Rate Route Frequency Ordered Stop   02/26/14 1600  piperacillin-tazobactam  (ZOSYN) IVPB 2.25 g  Status:  Discontinued     2.25 g 100 mL/hr over 30 Minutes Intravenous 3 times per day 02/26/14 1222 02/26/14 1849   02/18/14 1800  vancomycin (VANCOCIN) IVPB 750 mg/150 ml premix  Status:  Discontinued     750 mg 150 mL/hr over 60 Minutes Intravenous Every 12 hours 02/18/14 1726 02/24/14 1650   02/16/14 0000  piperacillin-tazobactam (ZOSYN) IVPB 3.375 g  Status:  Discontinued     3.375 g 12.5 mL/hr over 240 Minutes Intravenous 3 times per day 02/15/14 1516 02/26/14 1222   02/15/14 1600  vancomycin (VANCOCIN) IVPB 1000 mg/200 mL premix  Status:  Discontinued     1,000 mg 200 mL/hr over 60 Minutes Intravenous Every 12 hours 02/15/14 1516 02/18/14 1726   02/15/14 1600  piperacillin-tazobactam (ZOSYN) IVPB 3.375 g     3.375 g 100 mL/hr over 30 Minutes Intravenous  Once 02/15/14 1516 02/15/14 1900       Assessment/Plan  1. Synchronous colon masses, biopsy path pending 2. PCM  3. Hypoalbuminemia 4. ARF, nonoliguric 5. Anemia 6. Confusion  Plan: 1. Patient is very weak.  He has ARF (slowly improving), anemia, and PCM.  He would benefit from increase in his nutrition and strength prior to surgical  intervention, given he is not obstructed.  Will start a soft diet today and add Resource Breeze to see if he will drink this as he does not like ensure or boost.  I will also get a calorie count to determine if he is taking in enough orally or whether he may need some type of supplementation.  D/w Dr. Bonnielee Haff   LOS: 18 days    Quanika Solem E 03/05/2014, 8:52 AM Pager: [phone]

## 2014-03-05 NOTE — Progress Notes (Signed)
Physical Therapy Treatment Patient Details Name: Brent Little MRN: [id number] DOB: February 17, 1941 Today's Date: 03/05/2014    History of Present Illness Pt admitted with fever chills, decreased appetite and lethargy.  Liver mass noted on CT and diagnosed as an abcess after biopsy.GI was reconsulted and he underwent colonoscopy 10/23 which revealed colon cancer.    PT Comments    Pt progressing slowly towards physical therapy goals with increased difficulty in functional mobility from previous therapy session. Pt appears to have had a decline in strength and energy, and continues to have cognitive deficits beyond baseline, per wife. Per MD notes, pt will need to improve his nutritional status prior to undergoing surgery. Feel he may now be a good candidate for CIR due to recent medical findings, making this patient more medically complex. Patient will continue to benefit from skilled physical therapy services to further improve independence with functional mobility.   Follow Up Recommendations  CIR     Equipment Recommendations  None recommended by PT    Recommendations for Other Services Rehab consult     Precautions / Restrictions Precautions Precautions: Fall Restrictions Weight Bearing Restrictions: No    Mobility  Bed Mobility                  Transfers Overall transfer level: Needs assistance Equipment used: Rolling walker (2 wheeled) Transfers: Sit to/from Stand Sit to Stand: Min assist         General transfer comment: Min assist for loss of balance towards posterior upon standing x2. VC for technique. Good stability once holding rolling walker.  Ambulation/Gait Ambulation/Gait assistance: Min assist Ambulation Distance (Feet): 100 Feet Assistive device: Rolling walker (2 wheeled);None Gait Pattern/deviations: Step-through pattern;Decreased stride length;Staggering right;Narrow base of support   Gait velocity interpretation: Below normal speed for  age/gender General Gait Details: Very slow and guarded gait.  Min assist for walker control intermittently with VC for direction. Trial of ambulation withou assistive device required min assist for loss of balance towards pts right side. HR elevated to 122, SpO2 maintained 95% and greater during ambulatory bout.   Stairs            Wheelchair Mobility    Modified Rankin (Stroke Patients Only)       Balance                                    Cognition Arousal/Alertness: Awake/alert Behavior During Therapy: Flat affect Overall Cognitive Status: Impaired/Different from baseline Area of Impairment: Orientation;Memory;Following commands;Safety/judgement;Awareness;Attention;Problem solving Orientation Level: Disoriented to;Time;Situation Current Attention Level: Sustained Memory: Decreased short-term memory;Decreased recall of precautions Following Commands: Follows multi-step commands inconsistently Safety/Judgement: Decreased awareness of safety;Decreased awareness of deficits Awareness: Intellectual;Anticipatory Problem Solving: Slow processing;Decreased initiation;Difficulty sequencing;Requires verbal cues General Comments: Per wife, husband continues to remain confused since being admitted. Reports he was playing golf with friends only a week a week or two before coming to the hospial    Exercises General Exercises - Lower Extremity Ankle Circles/Pumps: AROM;Both;10 reps;Supine Long Arc Quad: Strengthening;Both;10 reps;Seated Hip Flexion/Marching: Strengthening;Both;10 reps;Seated Mini-Sqauts: Strengthening;Both;10 reps;Seated    General Comments        Pertinent Vitals/Pain Pain Assessment: No/denies pain Pain Intervention(s): Monitored during session    Home Living                      Prior Function  PT Goals (current goals can now be found in the care plan section) Acute Rehab PT Goals PT Goal Formulation: With  patient/family Time For Goal Achievement: 03/08/14 Potential to Achieve Goals: Good Progress towards PT goals: Progressing toward goals    Frequency  Min 3X/week    PT Plan Discharge plan needs to be updated    Co-evaluation             End of Session Equipment Utilized During Treatment: Gait belt Activity Tolerance: Patient tolerated treatment well Patient left: with family/visitor present;with call bell/phone within reach;in bed     Time: 1212-1230 PT Time Calculation (min): 18 min  Charges:  $Gait Training: 8-22 mins                    G Codes:      [employer], [place]  Ellouise Newer 03/05/2014, 1:22 PM

## 2014-03-06 DIAGNOSIS — R5381 Other malaise: Secondary | ICD-10-CM

## 2014-03-06 LAB — RENAL FUNCTION PANEL
Albumin: 1.9 g/dL — ABNORMAL LOW (ref 3.5–5.2)
Anion gap: 12 (ref 5–15)
BUN: 20 mg/dL (ref 6–23)
CALCIUM: 8.2 mg/dL — AB (ref 8.4–10.5)
CHLORIDE: 104 meq/L (ref 96–112)
CO2: 25 meq/L (ref 19–32)
CREATININE: 3.63 mg/dL — AB (ref 0.50–1.35)
GFR calc Af Amer: 18 mL/min — ABNORMAL LOW (ref >90)
GFR, EST NON AFRICAN AMERICAN: 15 mL/min — AB (ref >90)
Glucose, Bld: 116 mg/dL — ABNORMAL HIGH (ref 70–99)
Phosphorus: 3 mg/dL (ref 2.3–4.6)
Potassium: 3.6 mEq/L — ABNORMAL LOW (ref 3.7–5.3)
Sodium: 141 mEq/L (ref 137–147)

## 2014-03-06 MED ORDER — BOOST / RESOURCE BREEZE PO LIQD: 1.0000 | Freq: Three times a day (TID) | ORAL | Status: DC

## 2014-03-06 NOTE — Progress Notes (Signed)
Not eating well at all.  May need more aggressive enteral feeding with a feeding tube, but cannot be placed with a feeding tube.  Would need a PEG.  Have not spoken with any family about this at all.  Kathryne Eriksson. Dahlia Bailiff, MD, [place] [phone] [facility]

## 2014-03-06 NOTE — Progress Notes (Signed)
Patient ID: Brent Little, male   DOB: Feb 27, 1941, 73 y.o.   MRN: [id number] 4 Days Post-Op  Subjective: Pt feels well this morning.  No pain.  Ate some yesterday.  Objective: Vital signs in last 24 hours: Temp:  [97.4 F (36.3 C)-98.5 F (36.9 C)] 97.4 F (36.3 C) (10/27 0342) Pulse Rate:  [69-70] 69 (10/27 0342) Resp:  [18] 18 (10/27 0342) BP: (151-158)/(53-74) 158/74 mmHg (10/27 0342) SpO2:  [95 %-96 %] 96 % (10/27 0342) Weight:  [177 lb 4 oz (80.4 kg)] 177 lb 4 oz (80.4 kg) (10/27 0342) Last BM Date: 03/05/14  Intake/Output from previous day: 10/26 0701 - 10/27 0700 In: 560 [P.O.:560] Out: 975 [Urine:975] Intake/Output this shift:    PE: Abd: soft, NT,ND, +BS  Lab Results:   Recent Labs  03/04/14 0407 03/05/14 0320  WBC 9.5 9.4  HGB 8.2* 8.5*  HCT 25.4* 26.3*  PLT 312 270   BMET  Recent Labs  03/05/14 0320 03/06/14 0521  NA 141 141  K 3.2* 3.6*  CL 102 104  CO2 25 25  GLUCOSE 111* 116*  BUN 23 20  CREATININE 3.78* 3.63*  CALCIUM 8.2* 8.2*   PT/INR No results found for this basename: LABPROT, INR,  in the last 72 hours CMP     Component Value Date/Time   NA 141 03/06/2014 0521   K 3.6* 03/06/2014 0521   CL 104 03/06/2014 0521   CO2 25 03/06/2014 0521   GLUCOSE 116* 03/06/2014 0521   BUN 20 03/06/2014 0521   CREATININE 3.63* 03/06/2014 0521   CALCIUM 8.2* 03/06/2014 0521   PROT 5.5* 03/02/2014 0345   ALBUMIN 1.9* 03/06/2014 0521   AST 20 03/02/2014 0345   ALT 17 03/02/2014 0345   ALKPHOS 125* 03/02/2014 0345   BILITOT 0.4 03/02/2014 0345   GFRNONAA 15* 03/06/2014 0521   GFRAA 18* 03/06/2014 0521   Lipase  No results found for this basename: lipase       Studies/Results: No results found.  Anti-infectives: Anti-infectives   Start     Dose/Rate Route Frequency Ordered Stop   02/26/14 1600  piperacillin-tazobactam (ZOSYN) IVPB 2.25 g  Status:  Discontinued     2.25 g 100 mL/hr over 30 Minutes Intravenous 3 times per day 02/26/14  1222 02/26/14 1849   02/18/14 1800  vancomycin (VANCOCIN) IVPB 750 mg/150 ml premix  Status:  Discontinued     750 mg 150 mL/hr over 60 Minutes Intravenous Every 12 hours 02/18/14 1726 02/24/14 1650   02/16/14 0000  piperacillin-tazobactam (ZOSYN) IVPB 3.375 g  Status:  Discontinued     3.375 g 12.5 mL/hr over 240 Minutes Intravenous 3 times per day 02/15/14 1516 02/26/14 1222   02/15/14 1600  vancomycin (VANCOCIN) IVPB 1000 mg/200 mL premix  Status:  Discontinued     1,000 mg 200 mL/hr over 60 Minutes Intravenous Every 12 hours 02/15/14 1516 02/18/14 1726   02/15/14 1600  piperacillin-tazobactam (ZOSYN) IVPB 3.375 g     3.375 g 100 mL/hr over 30 Minutes Intravenous  Once 02/15/14 1516 02/15/14 1900       Assessment/Plan   1. Synchronous colon masses, biopsy path pending  2. SPCM  3. Hypoalbuminemia  4. ARF, nonoliguric  5. Anemia  6. Confusion  Plan: 1. Await calorie count.  If this is not adequate, then the patient will need a PICC and TNA for nutritional supplementation prior to surgical intervention.  Patient's prealbumin is only 6!  This needs to be much better prior to  surgery. 2. Cont current management.  Ok for dc from our standpoint once a determination is made whether he needs TNA or not.  Suspect within the next 1-2 days.  LOS: 19 days    Marshella Tello E 03/06/2014, 7:45 AM Pager: [phone]

## 2014-03-06 NOTE — Consult Note (Signed)
Physical Medicine and Rehabilitation Consult Reason for Consult: Debilitation/colon cancer Referring Physician: [facility]   HPI: Brent Little is a 73 y.o. right handed male with history of hypertension, initially presented to [facility] with complaints of fever, dull right upper quadrant pain and dry cough. He was diagnosed with possible community-acquired pneumonia placed on Levaquin. During his fever workup right upper quadrant ultrasound showed some nonspecific changes. Patient was independent prior to admission living with his wife in [place] He was discharged to home with outpatient MRI that showed a septated cystic lesion right hepatic lobe concerning for abscess versus metastatic necrotic mass. MRI also showed ascending colon circumferential mass. He continued to have fever and chills, decreased appetite and was admitted to [facility] 02/15/2014. Underwent ultrasound-guided biopsy 02/19/2014 yielded purulent appearing material and a drain was placed under ultrasound guidance. Culture Gram stain no organisms seen. Maintained on broad-spectrum antibiotics. Patient with elevating creatinine with baseline admission creatinine of 1.24 increased to 3.5 renal service is consulted. Renal ultrasound negative. Patient's vancomycin and ACE inhibitor held due to elevated creatinine. Follow-up repeat CT abdomen and pelvis with contrast showed decrease in size of the collection noted suspect abscess. Gastroenterology consulted and underwent colonoscopy for follow-up of colonic mass with findings of synchronous colon cancers in the ace ascending and descending colon. Patient's biopsies remain pending. General surgery consulted with plan to monitor and optimize patient's nutritional state with albumin of 1.7 as well as monitor renal function and consider surgical intervention once stabilized. Renal function appears to be stabilized at 3.78. Physical therapy ongoing noted debilitation as  well as bouts of confusion suspect encephalopathy with recommendations of physical medicine rehabilitation consult.   Review of Systems  Constitutional: Positive for fever, chills and malaise/fatigue.  Respiratory: Positive for cough.   Gastrointestinal: Positive for abdominal pain.  All other systems reviewed and are negative.  Past Medical History  Diagnosis Date  . Hypertension   . Acute renal failure 02/24/2014   Past Surgical History  Procedure Laterality Date  . Tonsillectomy    . Colonoscopy with propofol N/A 03/02/2014    Procedure: COLONOSCOPY WITH PROPOFOL;  Surgeon: Missy Sabins, MD;  Location: [facility];  Service: Endoscopy;  Laterality: N/A;   History reviewed. No pertinent family history. Social History:  reports that he has quit smoking. He does not have any smokeless tobacco history on file. He reports that he does not drink alcohol or use illicit drugs. Allergies: No Known Allergies Medications Prior to Admission  Medication Sig Dispense Refill  . hydrochlorothiazide (HYDRODIURIL) 25 MG tablet Take 25 mg by mouth daily.      . potassium chloride SA (K-DUR,KLOR-CON) 20 MEQ tablet Take 20 mEq by mouth daily.       . ramipril (ALTACE) 10 MG capsule Take 10 mg by mouth daily.       Marland Kitchen terazosin (HYTRIN) 5 MG capsule Take 5 mg by mouth daily.         Home: Home Living Family/patient expects to be discharged to:: Private residence Living Arrangements: Spouse/significant other Available Help at Discharge: Family;Available 24 hours/day Type of Home: House Home Access: Stairs to enter Home Layout: One level Home Equipment: Environmental consultant - 2 wheels;Cane - single point;Cane - quad  Functional History: Prior Function Level of Independence: Independent Comments: pt plays golf and drives, wife had been noticing increasing memory deficits for several months Functional Status:  Mobility: Bed Mobility Overal bed mobility: Needs Assistance Bed Mobility: Supine to Sit  Supine  to sit: Supervision Sit to supine: Supervision General bed mobility comments: Supervision for safety. VC for technique. no physical assist. Transfers Overall transfer level: Needs assistance Equipment used: Rolling walker (2 wheeled) Transfers: Sit to/from Stand Sit to Stand: Min assist General transfer comment: Min assist for loss of balance towards posterior upon standing x2. VC for technique. Good stability once holding rolling walker. Ambulation/Gait Ambulation/Gait assistance: Min assist Ambulation Distance (Feet): 100 Feet Assistive device: Rolling walker (2 wheeled);None Gait Pattern/deviations: Step-through pattern;Decreased stride length;Staggering right;Narrow base of support Gait velocity: decreased Gait velocity interpretation: Below normal speed for age/gender General Gait Details: Very slow and guarded gait.  Min assist for walker control intermittently with VC for direction. Trial of ambulation withou assistive device required min assist for loss of balance towards pts right side. HR elevated to 122, SpO2 maintained 95% and greater during ambulatory bout.    ADL: ADL Overall ADL's : Needs assistance/impaired Eating/Feeding: Supervision/ safety;Sitting Grooming: Wash/dry hands;Wash/dry face;Set up;Sitting (verbal cues for sequencing, stood x 5 minutes) Upper Body Bathing: Supervision/ safety;Cueing for sequencing;Sitting Lower Body Bathing: Minimal assistance;Sit to/from stand;Cueing for compensatory techniques;Cueing for safety Lower Body Bathing Details (indicate cue type and reason): posterior lean duirng standing, additional cleanup required due to incontinent of BM Upper Body Dressing : Minimal assistance;Sitting Lower Body Dressing:  (adult diaper and socks) Toilet Transfer:  (over toilet) Toileting- Clothing Manipulation and Hygiene:  (assisted for balance in standing as he wiped) Functional mobility during ADLs: Minimal assistance;Rolling walker (+2 to manage  IV) General ADL Comments: Difficulty sequencing grooming, impaired balance interfering with standing ADL.  Cognition: Cognition Overall Cognitive Status: Impaired/Different from baseline Orientation Level: Oriented to person;Disoriented to place;Disoriented to time;Disoriented to situation Cognition Arousal/Alertness: Awake/alert Behavior During Therapy: Flat affect Overall Cognitive Status: Impaired/Different from baseline Area of Impairment: Orientation;Memory;Following commands;Safety/judgement;Awareness;Attention;Problem solving Orientation Level: Disoriented to;Time;Situation Current Attention Level: Sustained Memory: Decreased short-term memory;Decreased recall of precautions Following Commands: Follows multi-step commands inconsistently Safety/Judgement: Decreased awareness of safety;Decreased awareness of deficits Awareness: Intellectual;Anticipatory Problem Solving: Slow processing;Decreased initiation;Difficulty sequencing;Requires verbal cues General Comments: Per wife, husband continues to remain confused since being admitted. Reports he was playing golf with friends only a week a week or two before coming to the hospial  Blood pressure 158/74, pulse 69, temperature 97.4 F (36.3 C), temperature source Oral, resp. rate 18, height 6' (1.829 m), weight 80.4 kg (177 lb 4 oz), SpO2 96.00%. Physical Exam  Vitals reviewed. HENT:  Head: Normocephalic.  Eyes: EOM are normal.  Neck: Normal range of motion. Neck supple. No thyromegaly present.  Cardiovascular: Normal rate and regular rhythm.   Respiratory: Effort normal and breath sounds normal. No respiratory distress.  GI: Soft. Bowel sounds are normal. There is no tenderness.  Neurological: He is alert.  Patient is oriented to person, date of birth. He had some delay and providing his age correctly. Limited awareness of his deficits. He did follow simple commands. Speech dysarthric.  Skin: Skin is warm and dry.  Psychiatric:   Slight confusion    Results for orders placed during the hospital encounter of 02/15/14 (from the past 24 hour(s))  URINALYSIS, ROUTINE W REFLEX MICROSCOPIC     Status: None   Collection Time    03/05/14 12:10 PM      Result Value Ref Range   Color, Urine YELLOW  YELLOW   APPearance CLEAR  CLEAR   Specific Gravity, Urine 1.006  1.005 - 1.030   pH 7.5  5.0 - 8.0   Glucose,  UA NEGATIVE  NEGATIVE mg/dL   Hgb urine dipstick NEGATIVE  NEGATIVE   Bilirubin Urine NEGATIVE  NEGATIVE   Ketones, ur NEGATIVE  NEGATIVE mg/dL   Protein, ur NEGATIVE  NEGATIVE mg/dL   Urobilinogen, UA 0.2  0.0 - 1.0 mg/dL   Nitrite NEGATIVE  NEGATIVE   Leukocytes, UA NEGATIVE  NEGATIVE   No results found.  Assessment/Plan: Diagnosis: colon cancer with mets, subsequent deconditioning/encephalopathy 1. Does the need for close, 24 hr/day medical supervision in concert with the patient's rehab needs make it unreasonable for this patient to be served in a less intensive setting? Yes 2. Co-Morbidities requiring supervision/potential complications: pain, htn, 3. Due to bladder management, bowel management, safety, skin/wound care, disease management, medication administration, pain management and patient education, does the patient require 24 hr/day rehab nursing? Yes 4. Does the patient require coordinated care of a physician, rehab nurse, PT (1-2 hrs/day, 5 days/week) and OT (1-2 hrs/day, 5 days/week) to address physical and functional deficits in the context of the above medical diagnosis(es)? Yes Addressing deficits in the following areas: balance, endurance, locomotion, strength, transferring, bowel/bladder control, bathing, dressing, feeding, grooming, toileting, cognition and psychosocial support 5. Can the patient actively participate in an intensive therapy program of at least 3 hrs of therapy per day at least 5 days per week? Yes 6. The potential for patient to make measurable gains while on inpatient rehab is  good 7. Anticipated functional outcomes upon discharge from inpatient rehab are supervision  with PT, supervision and min assist with OT, n/a with SLP. 8. Estimated rehab length of stay to reach the above functional goals is: 10-15 days 9. Does the patient have adequate social supports to accommodate these discharge functional goals? Yes 10. Anticipated D/C setting: Home 11. Anticipated post D/C treatments: [place] therapy 12. Overall Rehab/Functional Prognosis: good  RECOMMENDATIONS: This patient's condition is appropriate for continued rehabilitative care in the following setting: CIR Patient has agreed to participate in recommended program. Yes Note that insurance prior authorization may be required for reimbursement for recommended care.  Comment: Would need to figure out the "game plan" or "end point" for CIR. We will not be able to keep this patient for 3 weeks in preparation for potential surgery. Will need to discuss with surgical team.  Rehab Admissions Coordinator to follow up.  Thanks,  Meredith Staggers, MD, [facility]     03/06/2014

## 2014-03-06 NOTE — Progress Notes (Signed)
S: No complaints O:BP 158/74  Pulse 69  Temp(Src) 97.4 F (36.3 C) (Oral)  Resp 18  Ht 6' (1.829 m)  Wt 177 lb 4 oz (80.4 kg)  BMI 24.03 kg/m2  SpO2 96%  Intake/Output Summary (Last 24 hours) at 03/06/14 1357 Last data filed at 03/06/14 0751  Gross per 24 hour  Intake    440 ml  Output    975 ml  Net   -535 ml   Intake/Output: I/O last 3 completed shifts: In: 560 [P.O.:560] Out: 975 [Urine:975]  Intake/Output this shift:  Total I/O In: 120 [P.O.:120] Out: 250 [Urine:250] Weight change: -1 lb 15.7 oz (-0.9 kg) Gen:NAD, thin elderly gentleman, lying in bed, pale, somewhat confused CVS:RRR, 2/6 M   Resp: decreased bs bilaterally XVQ:MGQQ, NTND, +BS Ext:WWP,1+ edema   Recent Labs Lab 02/28/14 0854 03/01/14 0448 03/01/14 2000 03/02/14 0345 03/03/14 0252 03/04/14 0407 03/05/14 0320 03/06/14 0521  NA 139 142  --  141 144 142 141 141  K 3.9 3.8  --  3.4* 3.5* 3.0* 3.2* 3.6*  CL 102 100  --  98 103 102 102 104  CO2 24 29  --  27 26 27 25 25   GLUCOSE 115* 95  --  82 89 121* 111* 116*  BUN 32* 31*  --  31* 31* 28* 23 20  CREATININE 3.91* 4.02*  --  4.12* 4.06* 3.94* 3.78* 3.63*  ALBUMIN 1.9* 1.7* 1.9* 2.0*  2.0*  --   --   --  1.9*  CALCIUM 8.1* 7.9*  --  8.2* 8.4 8.2* 8.2* 8.2*  PHOS 3.5 3.5  --  4.9*  --   --   --  3.0  AST  --   --  22 20  --   --   --   --   ALT  --   --  18 17  --   --   --   --    Liver Function Tests:  Recent Labs Lab 03/01/14 2000 03/02/14 0345 03/06/14 0521  AST 22 20  --   ALT 18 17  --   ALKPHOS 127* 125*  --   BILITOT 0.4 0.4  --   PROT 5.5* 5.5*  --   ALBUMIN 1.9* 2.0*  2.0* 1.9*   No results found for this basename: LIPASE, AMYLASE,  in the last 168 hours No results found for this basename: AMMONIA,  in the last 168 hours CBC:  Recent Labs Lab 03/01/14 0448 03/02/14 0345 03/03/14 0252 03/04/14 0407 03/05/14 0320  WBC 8.4 9.1 10.6* 9.5 9.4  HGB 7.8* 8.2* 8.1* 8.2* 8.5*  HCT 23.6* 26.1* 24.6* 25.4* 26.3*  MCV  78.1 79.3 79.1 78.9 79.5  PLT 224 251 292 312 270   Cardiac Enzymes: No results found for this basename: CKTOTAL, CKMB, CKMBINDEX, TROPONINI,  in the last 168 hours CBG: No results found for this basename: GLUCAP,  in the last 168 hours  Iron Studies:  No results found for this basename: IRON, TIBC, TRANSFERRIN, FERRITIN,  in the last 72 hours Studies/Results: No results found. . feeding supplement (RESOURCE BREEZE)  1 Container Oral TID BM  . Influenza vac split quadrivalent PF  0.5 mL Intramuscular Tomorrow-1000  . pneumococcal 23 valent vaccine  0.5 mL Intramuscular Tomorrow-1000  . terazosin  5 mg Oral Daily    Assessment/Plan: 73yo man who presented with 1 month FTT found to have a liver abscess and colon masses.  1. AKI/CKD in setting of volume depletion/hypotension/Ace-inhibition/NSAIDs/vanc  toxicity. Continue to hold vanco and renal dose medication.  1. Continue IV fluids, can likely d/c tomorrow if PO continues to improve 2. Kidney function slowly better by trend.  Little GFR change because of course, suspect toxic component, ? VANC 3. High risk of further renal damage with surgery at this time so would defer if possible and higher morbidity and mortality in setting AKI 2. Hematuria - resolved, would get urology consult if recurs 3. Anemia- normocytic-?GIB vs due to malignancy  4. HTN- stable, cont to hold ACE avoid low bp 5. Encephalopathy-MRI brain negative, likely delirium 6. N/V/D/protein malnutrition- per GI and primary - awaiting 24 hour calorie count and decision on TNA 7. Colon mass- circumferential ascending and smaller descending colon masses, surgery consulted and will defer 1-2 weeks until strength improved with nutrition and rehab  Beverlyn Roux I have seen and examined this patient and agree with the plan of care seen, eval, examined, and discussed with residents and primary .  Tareek Sabo,Ethon L 03/06/2014, 2:39 PM

## 2014-03-06 NOTE — Progress Notes (Signed)
Inpatient Rehabilitation  I have begun post acute rehab discussions with Mr. And Mrs. Rom.  I will follow up tomorrow to further ascertain medical readiness and appropriateness for CIR program.  Please call if questions.  [facility] Admissions Coordinator Cell [phone] Office [phone]

## 2014-03-06 NOTE — Progress Notes (Signed)
Calorie Count Follow Up  24 hour calorie count ordered.  Diet: Dysphagia 3, thin liquids Supplements: Resource [employer] 10/26: bites Breakfast 10/27: 40 kcals, 0 gm protein  Lunch 10/27: 210 kcals, 4 gm protein  Supplements/snacks (ice cream): 850 kcals, 21 gm protein  Total intake: 1100 kcal (50% of minimum estimated needs)  25 gm protein (25% of minimum estimated needs)  Nutrition Dx: Inadequate oral intake related to poor appetite, ascending colon mass as evidenced by PO intake 0-25%, ongoing  Goal: Pt to meet >/= 90% of their estimated nutrition needs, unmet  Intervention:   D/C calorie count  Resource Breeze po TID, each supplement provides 250 kcal and 9 grams of protein  TPN vs EN initiation -- please re-consult as needed RD to follow for nutrition care plan  Arthur Holms, RD, LDN Pager #: [phone] After-Hours Pager #: [phone]

## 2014-03-06 NOTE — Progress Notes (Signed)
Occupational Therapy Treatment Patient Details Name: Brent Little MRN: [id number] DOB: 03-Oct-1940 Today's Date: 03/06/2014    History of present illness Pt admitted with fever chills, decreased appetite and lethargy.  Liver mass noted on CT and diagnosed as an abcess after biopsy.GI was reconsulted and he underwent colonoscopy 10/23 which revealed colon cancer.   OT comments  Eager for participation in therapies.  Currently requiring multiple cues for safety and sequencing during adls secondary to visible confusion during instructions.  Poor safety awareness with fast ambulation speed makes this pt. A high fall risk.  Will benefit from intense continued therapies to facilitate with increasing safety and strength for safe return home with wife.    Follow Up Recommendations  SNF;CIR    Equipment Recommendations  None recommended by OT          Precautions / Restrictions Precautions Precautions: Fall       Mobility Bed Mobility Overal bed mobility: Needs Assistance Bed Mobility: Supine to Sit     Supine to sit: Supervision Sit to supine: Supervision   General bed mobility comments: Supervision for safety. VC for technique. no physical assist.  Transfers Overall transfer level: Needs assistance Equipment used: Rolling walker (2 wheeled) Transfers: Sit to/from Stand Sit to Stand: Min assist              Balance                                   ADL Overall ADL's : Needs assistance/impaired     Grooming: Wash/dry hands;Minimal assistance;Cueing for sequencing;Cueing for safety;Standing Grooming Details (indicate cue type and reason): pt. required cues for sequencing steps of washing hands.  hesitation with washing hands secondary to visible confusion of how to begin.  instructional and tactile guidance provided to reach for soap.  pt. then unable to locate paper towels with multiple cues.  required additional assitance to stop drying hands once finished as  he perseverated and continue to wipe hands over and over                 Toilet Transfer: Minimal assistance;Cueing for sequencing;Cueing for safety;Ambulation;RW;Regular Toilet;Grab bars Toilet Transfer Details (indicate cue type and reason): pt. amb. very fast and almost reckless, stating "its coming", thought pt. was referring to bowel movement as he had a condom cath on, however he was not aware of condom cath and thought he had to urinate. provided multiple explanations and showed pt. the tubing explaining wher the urine was traveling he cont to pull on his genitals trying to urinate.  eventually pulling the catehter off and continued to try to stand and walk around it while also connected to iv.  poor safety awareness with multiple fall risks during ambulation in room Toileting- Clothing Manipulation and Hygiene: Minimal assistance;Sit to/from stand         General ADL Comments: Difficulty sequencing grooming, poor safety awareness during ambulation with management of condom cath, iv pole, and rw.  requires multiple forms of cues and still unable to follow one step commands consistently                                      Cognition   Behavior During Therapy: Flat affect Overall Cognitive Status: Impaired/Different from baseline Area of Impairment: Orientation;Memory;Following commands;Safety/judgement;Awareness;Attention;Problem solving Orientation Level: Disoriented to;Time;Situation Current Attention  Level: Sustained Memory: Decreased short-term memory;Decreased recall of precautions  Following Commands: Follows multi-step commands inconsistently;Follows one step commands inconsistently Safety/Judgement: Decreased awareness of safety;Decreased awareness of deficits Awareness: Intellectual;Anticipatory Problem Solving: Slow processing;Decreased initiation;Difficulty sequencing;Requires verbal cues                                 General Comments   pt. LOVES to play golf. His best friend/ golf buddy is present and visits a lot with pt.     Pertinent Vitals/ Pain       Pain Assessment: No/denies pain                                                          Frequency Min 2X/week     Progress Toward Goals  OT Goals(current goals can now be found in the care plan section)  Progress towards OT goals: Progressing toward goals     Plan Discharge plan needs to be updated                     End of Session Equipment Utilized During Treatment: Gait belt;Rolling walker   Activity Tolerance Patient tolerated treatment well   Patient Left in chair;with call bell/phone within reach;with family/visitor present   Nurse Communication Other (comment) (condom catheter off, pt. request for lip balm chapped lips)        Time: 0175-1025 OT Time Calculation (min): 24 min  Charges: OT General Charges $OT Visit: 1 Procedure OT Treatments $Self Care/Home Management : 23-37 mins  Janice Coffin, COTA/L 03/06/2014, 11:28 AM

## 2014-03-06 NOTE — Progress Notes (Signed)
[facility] PROGRESS NOTE  Muadh Creasy ZOX:[id number] DOB: Apr 03, 1941 DOA: 02/15/2014  PCP: [name], MD  Brief Narrative: Blakely Gluth is a 73 y.o. male, With H/O HTN, Kidney stone, who initially presented to [facility] with fever, dull right upper quadrant pain and a dry cough. He was diagnosed there with a possible community-acquired pneumonia, was placed on Levaquin, he had marginal improvement. During his fever workup he had a right upper quadrant ultrasound which showed some nonspecific changes . He was then discharged home with outpatient MRI. MRI done in the outpatient setting showed a septated cystic lesion in the right hepatic lobe concerning for abscess versus metastatic necrotic mass. MRI also showed an ascending colon circumferential mass. He presented to [facility] with fever, chills, decreased appetite and lethargy. He was followed by [facility]/GI and IR. He has been on broad spectrum Abx, finally underwent Liver biopsy 10/12. And had a drain put it. His creatinine started creeping up on 10/16. Vancomycin trough came back elevated. Stopped vancomycin and Nephrology was consulted. His blood cultures and cultures from the liver abscess have all come back negative and zosyn was discontinued on 10/19 after completing 10 days of IV antibiotics. Then he had minimal rectal bleeding. GI was reconsulted and he underwent colonoscopy 10/23 which revealed colon cancer. His renal function is slowly improving. Plan is for patient's nutritional status to improve prior to undergoing surgery. Might need to go to rehab first.  Assessment/Plan:  Colon Cancer/Hematochezia GI consulted for colonoscopy. They initially recommended outpatient colonoscopy when he recovers from the liver issue. However he developed mild rectal bleeding. Colonoscopy done on 10/23 which revealed colon cancer. CEA mildly elevated at 9. No further blood in stool. Surgery was consulted. It is felt that his impaired renal function  could get worse if surgery is done now. Plus patient's nutritional status needs to be better as well. Patient might have to go to SNF/Rehab for few weeks to improve his functional status. Calorie count in progress. His oral intake continues to be poor. General surgery considering alterative modality of nutrition. Will determine in 1-2 days.   Acute renal failure Probably secondary to vancomycin toxicity vs ace inhibition vs NSAID use vs volume depletion. [place] was negative for hydronephrosis. UA is negative. Nephrology following. Was given HCO3 infusion. Renal function is improving slowly though not back to baseline.   Liver abscess vs necrotic mets He underwent US guided biopsy 10/12; yielded purulent appearing material. 73F drain was placed under US guidance. Culture and gram stain were sent. No organisms seen. Blood cultures are  negative. Cytology was negative for malignant cells. He completed 10 days of IV antibiotics. Vancomycin was stopped on 10/16 after the trough came back elevated. A repeat CT abdomen and pelvis without contrast showed decrease in the size of the collection. Drain removed 10/21.   Nausea/Poor Appetite/Severe Protein Calorie malnutrition Likely due to acute illness, ARF. US shows gallstones but no acute inflammation. LFT's normal. Diet advanced. Calorie count.   Essential HTN BP fluctuates but stable. ACE on hold  Iron Deficiency Anemia Hgb noted to be lower but stable. Monitor for now.   Dysuria/Hematuria He had transient hematuria. Now resolved. He is noted to have renal cysts and enlarged prostate on [place]. These could have contributed. Discussed with Dr. Alinda Money with Urology on 10/25 and he recommends OP evaluation for same especially as hematuria seems to have been transient.   Confusion/Acute Encephalopathy Initially thought to be secondary to narcotic pain medications. These were stopped. MRI  brian was done due to persistent symptoms. MRI was negative. Stable with  occasional periods of confusion. Patient likely has underlying cognitive impairment. Also stopped Benadryl which could have contributed as well.  Sinus Bradycardia Asymptomatic. Not on rate limiting medications.  DVT proph: SCD's Code Status: Full Code Family Communication: Discussed with patient and wife at bedside Disposition Plan: SNF or CIR. Rehab to discuss with General Surgeon regarding timing of surgery which will weigh in on decision to take him to CIR.   Procedures IR placed drain in liver lesion.  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 Denies any pain.   Objective: Filed Vitals:   03/06/14 0342  BP: 158/74  Pulse: 69  Temp: 97.4 F (36.3 C)  Resp: 18    Intake/Output Summary (Last 24 hours) at 03/06/14 1111 Last data filed at 03/06/14 0343  Gross per 24 hour  Intake    440 ml  Output    725 ml  Net   -285 ml   Filed Weights   03/04/14 0540 03/05/14 0342 03/06/14 0342  Weight: 81.149 kg (178 lb 14.4 oz) 81.3 kg (179 lb 3.7 oz) 80.4 kg (177 lb 4 oz)    Exam:   General:  alert and conversant, but confused. Recognized his wife. Knew he was in [place]. Couldn't tell me the date, month, year.  Cardiovascular: S1S2/RRR  Respiratory: CTA bilaterally  Abdomen: soft, mild tenderness in RUQ, BS present.  Musculoskeletal: no edema  Data Reviewed: Basic Metabolic Panel:  Recent Labs Lab 02/28/14 0854 03/01/14 0448 03/02/14 0345 03/03/14 0252 03/04/14 0407 03/05/14 0320 03/06/14 0521  NA 139 142 141 144 142 141 141  K 3.9 3.8 3.4* 3.5* 3.0* 3.2* 3.6*  CL 102 100 98 103 102 102 104  CO2 24 29 27 26 27 25 25   GLUCOSE 115* 95 82 89 121* 111* 116*  BUN 32* 31* 31* 31* 28* 23 20  CREATININE 3.91* 4.02* 4.12* 4.06* 3.94* 3.78* 3.63*  CALCIUM 8.1* 7.9* 8.2* 8.4 8.2* 8.2* 8.2*  PHOS 3.5 3.5 4.9*  --   --   --  3.0    Liver Function Tests:  Recent Labs Lab 02/28/14 0854 03/01/14 0448 03/01/14 2000 03/02/14 0345 03/06/14 0521  AST  --   --  22 20  --   ALT  --   --  18 17  --   ALKPHOS  --   --  127* 125*  --   BILITOT  --   --  0.4 0.4  --   PROT  --   --  5.5* 5.5*  --   ALBUMIN 1.9* 1.7* 1.9* 2.0*  2.0* 1.9*   CBC:  Recent Labs Lab 03/01/14 0448 03/02/14 0345 03/03/14 0252 03/04/14 0407 03/05/14 0320  WBC 8.4 9.1 10.6* 9.5 9.4  HGB 7.8* 8.2* 8.1* 8.2* 8.5*  HCT 23.6* 26.1* 24.6* 25.4* 26.3*  MCV 78.1 79.3 79.1 78.9 79.5  PLT 224 251 292 312 270   Cardiac Enzymes: No results found for this basename: CKTOTAL, CKMB, CKMBINDEX, TROPONINI,  in the last 168 hours   Recent Results (from the past 240 hour(s))  CLOSTRIDIUM DIFFICILE BY PCR     Status: None   Collection Time    02/25/14  2:17 PM      Result Value Ref Range Status   C difficile by pcr NEGATIVE  NEGATIVE Final  Studies: No results found.  Scheduled Meds: . feeding supplement (ENSURE COMPLETE)  237 mL Oral BID BM  . feeding supplement (RESOURCE BREEZE)  1 Container Oral BID BM  . Influenza vac split quadrivalent PF  0.5 mL Intramuscular Tomorrow-1000  . pneumococcal 23 valent vaccine  0.5 mL Intramuscular Tomorrow-1000  . terazosin  5 mg Oral Daily   Continuous Infusions: . dextrose 5 % and 0.45% NaCl 1,000 mL (03/05/14 1738)   Antibiotics Given (last 72 hours)   None      Principal Problem:   Essential hypertension Active Problems:   Liver abscess   BPH (benign prostatic hyperplasia)   Colonic mass   Acute renal failure   Hematuria  Time spent: 16min   Yasheka Fossett   [facility]  Pager [phone].   If 7PM-7AM, please contact night-coverage at [email or web address], password [facility] 03/06/2014, 11:11 AM  LOS: 19 days

## 2014-03-07 LAB — CBC
HCT: 22.7 % — ABNORMAL LOW (ref 39.0–52.0)
Hemoglobin: 7.4 g/dL — ABNORMAL LOW (ref 13.0–17.0)
MCH: 26.3 pg (ref 26.0–34.0)
MCHC: 32.6 g/dL (ref 30.0–36.0)
MCV: 80.8 fL (ref 78.0–100.0)
Platelets: 188 10*3/uL (ref 150–400)
RBC: 2.81 MIL/uL — ABNORMAL LOW (ref 4.22–5.81)
RDW: 15.9 % — AB (ref 11.5–15.5)
WBC: 9.2 10*3/uL (ref 4.0–10.5)

## 2014-03-07 LAB — RENAL FUNCTION PANEL
ALBUMIN: 1.9 g/dL — AB (ref 3.5–5.2)
Anion gap: 13 (ref 5–15)
BUN: 19 mg/dL (ref 6–23)
CHLORIDE: 102 meq/L (ref 96–112)
CO2: 23 meq/L (ref 19–32)
Calcium: 8.3 mg/dL — ABNORMAL LOW (ref 8.4–10.5)
Creatinine, Ser: 3.44 mg/dL — ABNORMAL HIGH (ref 0.50–1.35)
GFR calc Af Amer: 19 mL/min — ABNORMAL LOW (ref >90)
GFR, EST NON AFRICAN AMERICAN: 16 mL/min — AB (ref >90)
Glucose, Bld: 103 mg/dL — ABNORMAL HIGH (ref 70–99)
PHOSPHORUS: 3.2 mg/dL (ref 2.3–4.6)
POTASSIUM: 3.6 meq/L — AB (ref 3.7–5.3)
SODIUM: 138 meq/L (ref 137–147)

## 2014-03-07 LAB — DIFFERENTIAL
BASOS ABS: 0.1 10*3/uL (ref 0.0–0.1)
Basophils Relative: 1 % (ref 0–1)
EOS ABS: 1.4 10*3/uL — AB (ref 0.0–0.7)
EOS PCT: 15 % — AB (ref 0–5)
Lymphocytes Relative: 15 % (ref 12–46)
Lymphs Abs: 1.4 10*3/uL (ref 0.7–4.0)
Monocytes Absolute: 1.3 10*3/uL — ABNORMAL HIGH (ref 0.1–1.0)
Monocytes Relative: 15 % — ABNORMAL HIGH (ref 3–12)
Neutro Abs: 5 10*3/uL (ref 1.7–7.7)
Neutrophils Relative %: 54 % (ref 43–77)

## 2014-03-07 MED ORDER — POTASSIUM CHLORIDE CRYS ER 20 MEQ PO TBCR
40.0000 meq | EXTENDED_RELEASE_TABLET | Freq: Once | ORAL | Status: AC
Start: 2014-03-07 — End: 2014-03-07
  Administered 2014-03-07: 40 meq via ORAL
  Filled 2014-03-07: qty 2

## 2014-03-07 MED ORDER — VITAL HIGH PROTEIN PO LIQD: 1000.0000 mL | ORAL | Status: DC

## 2014-03-07 MED ORDER — VITAL AF 1.2 CAL PO LIQD
1000.0000 mL | ORAL | Status: DC
  Filled 2014-03-07 (×3): qty 1000

## 2014-03-07 MED ORDER — ENSURE COMPLETE PO LIQD
237.0000 mL | Freq: Three times a day (TID) | ORAL | Status: DC
  Administered 2014-03-07 – 2014-03-09 (×4): 237 mL via ORAL

## 2014-03-07 NOTE — Progress Notes (Signed)
Patient ID: Brent Little, male   DOB: Oct 26, 1940, 73 y.o.   MRN: [id number] 5 Days Post-Op  Subjective: Pt feels well today.  Not eating per his wife.  Having diarrhea, but is getting daily miralax  Objective: Vital signs in last 24 hours: Temp:  [98.2 F (36.8 C)-99.4 F (37.4 C)] 98.7 F (37.1 C) (10/28 0351) Pulse Rate:  [68-87] 79 (10/28 0351) Resp:  [18-20] 18 (10/28 0351) BP: (133-163)/(55-70) 163/70 mmHg (10/28 0351) SpO2:  [94 %-98 %] 94 % (10/28 0351) Weight:  [180 lb 8.9 oz (81.9 kg)] 180 lb 8.9 oz (81.9 kg) (10/28 0351) Last BM Date: 03/06/14  Intake/Output from previous day: 10/27 0701 - 10/28 0700 In: 1920 [P.O.:720; I.V.:1200] Out: 350 [Urine:350] Intake/Output this shift:    PE: Abd: soft, NT, ND, +BS  Lab Results:   Recent Labs  03/05/14 0320  WBC 9.4  HGB 8.5*  HCT 26.3*  PLT 270   BMET  Recent Labs  03/05/14 0320 03/06/14 0521  NA 141 141  K 3.2* 3.6*  CL 102 104  CO2 25 25  GLUCOSE 111* 116*  BUN 23 20  CREATININE 3.78* 3.63*  CALCIUM 8.2* 8.2*   PT/INR No results found for this basename: LABPROT, INR,  in the last 72 hours CMP     Component Value Date/Time   NA 141 03/06/2014 0521   K 3.6* 03/06/2014 0521   CL 104 03/06/2014 0521   CO2 25 03/06/2014 0521   GLUCOSE 116* 03/06/2014 0521   BUN 20 03/06/2014 0521   CREATININE 3.63* 03/06/2014 0521   CALCIUM 8.2* 03/06/2014 0521   PROT 5.5* 03/02/2014 0345   ALBUMIN 1.9* 03/06/2014 0521   AST 20 03/02/2014 0345   ALT 17 03/02/2014 0345   ALKPHOS 125* 03/02/2014 0345   BILITOT 0.4 03/02/2014 0345   GFRNONAA 15* 03/06/2014 0521   GFRAA 18* 03/06/2014 0521   Lipase  No results found for this basename: lipase       Studies/Results: No results found.  Anti-infectives: Anti-infectives   Start     Dose/Rate Route Frequency Ordered Stop   02/26/14 1600  piperacillin-tazobactam (ZOSYN) IVPB 2.25 g  Status:  Discontinued     2.25 g 100 mL/hr over 30 Minutes Intravenous 3 times  per day 02/26/14 1222 02/26/14 1849   02/18/14 1800  vancomycin (VANCOCIN) IVPB 750 mg/150 ml premix  Status:  Discontinued     750 mg 150 mL/hr over 60 Minutes Intravenous Every 12 hours 02/18/14 1726 02/24/14 1650   02/16/14 0000  piperacillin-tazobactam (ZOSYN) IVPB 3.375 g  Status:  Discontinued     3.375 g 12.5 mL/hr over 240 Minutes Intravenous 3 times per day 02/15/14 1516 02/26/14 1222   02/15/14 1600  vancomycin (VANCOCIN) IVPB 1000 mg/200 mL premix  Status:  Discontinued     1,000 mg 200 mL/hr over 60 Minutes Intravenous Every 12 hours 02/15/14 1516 02/18/14 1726   02/15/14 1600  piperacillin-tazobactam (ZOSYN) IVPB 3.375 g     3.375 g 100 mL/hr over 30 Minutes Intravenous  Once 02/15/14 1516 02/15/14 1900       Assessment/Plan   1. Synchronous colon masses, biopsy path pending  2. SPCM  3. Hypoalbuminemia  4. ARF, nonoliguric  5. Anemia  6. Confusion  Plan: 1. I have spoken to Manuela Schwartz, with CIR to discuss placement of PANDA tube.  Given his poor PO intake, I have spoken with the patient and his wife about placing a PANDA tube and starting enteral tube feeds  to get his nutritional status better prior to surgical intervention.  If, due to his confusion, he pulls his PANDA out, I would then switch to PICC and TNA to decrease the number of times of having to place and replace his PANDA.  I discussed this with the wife as well.  I suspect once he has completed CIR, he will go home with [facility] and continued TFs unless by chance his appetite improves so much in CIR that he can maintain his nutrition on his own. 2. Once DC from rehab, then he can follow up with Dr. Ninfa Linden in our office to set up surgery time for colon masses.  He will need his prealbumin checked again in about 2 weeks to get a gauge of his nutritional status. 3. Will go ahead and write for PANDA placement today and dietician consult for initiation and advancement of TFs.  Once he has this placed, he is surgically stable  for dc to rehab when medically stable.  LOS: 20 days    Adanna Zuckerman E 03/07/2014, 8:03 AM Pager: [phone]

## 2014-03-07 NOTE — Progress Notes (Signed)
[facility] PROGRESS NOTE  Brent Little BPZ:[id number] DOB: [birth year] DOA: 02/15/2014  PCP: [name], MD  Brief Narrative: Brent Little is a 73 y.o. male, With H/O HTN, Kidney stone, who initially presented to [place] with fever, dull right upper quadrant pain and a dry cough. He was diagnosed there with a possible community-acquired pneumonia, was placed on Levaquin, he had marginal improvement. During his fever workup he had a right upper quadrant ultrasound which showed some nonspecific changes . He was then discharged home with outpatient MRI. MRI done in the outpatient setting showed a septated cystic lesion in the right hepatic lobe concerning for abscess versus metastatic necrotic mass. MRI also showed an ascending colon circumferential mass. He presented to [facility] with fever, chills, decreased appetite and lethargy. He was followed by CCS/GI and IR. He has been on broad spectrum Abx, finally underwent Liver biopsy 10/12. And had a drain put it. His creatinine started creeping up on 10/16. Vancomycin trough came back elevated. Stopped vancomycin and Nephrology was consulted. His blood cultures and cultures from the liver abscess have all come back negative and zosyn was discontinued on 10/19 after completing 10 days of IV antibiotics. Then he had minimal rectal bleeding. GI was reconsulted and he underwent colonoscopy 10/23 which revealed colon cancer. His renal function is slowly improving. Plan is for patient's nutritional status to improve prior to undergoing surgery. Might need to go to rehab first.  Assessment/Plan:  Colon Cancer/Hematochezia GI consulted for colonoscopy. They initially recommended outpatient colonoscopy when he recovers from the liver issue. However he developed mild rectal bleeding. Colonoscopy done on 10/23 which revealed colon cancer. CEA mildly elevated at 9. No further blood in stool. Surgery was consulted. It is felt that his impaired renal function  could get worse if surgery is done now. Plus patient's nutritional status needs to be better as well. Patient might have to go to SNF/Rehab for few weeks to improve his functional status. Calorie count in progress. His oral intake continues to be poor. General surgery considering alterative modality of nutrition.plan for panda tube placement today and then to rehabilitation.   Acute renal failure Probably secondary to vancomycin toxicity vs ace inhibition vs NSAID use vs volume depletion. [place] was negative for hydronephrosis. UA is negative. Nephrology following. Was given HCO3 infusion. Renal function is improving slowly though not back to baseline.   Liver abscess vs necrotic mets He underwent US guided biopsy 10/12; yielded purulent appearing material. 62F drain was placed under US guidance. Culture and gram stain were sent. No organisms seen. Blood cultures are  negative. Cytology was negative for malignant cells. He completed 10 days of IV antibiotics. Vancomycin was stopped on 10/16 after the trough came back elevated. A repeat CT abdomen and pelvis without contrast showed decrease in the size of the collection. Drain removed 10/21.   Nausea/Poor Appetite/Severe Protein Calorie malnutrition Likely due to acute illness, ARF. US shows gallstones but no acute inflammation. LFT's normal. Diet advanced. Calorie count.   Essential HTN BP fluctuates but stable. ACE on hold  Iron Deficiency Anemia Hgb noted to be lower but stable. Monitor for now.   Dysuria/Hematuria He had transient hematuria. Now resolved. He is noted to have renal cysts and enlarged prostate on [place]. These could have contributed. Discussed with Dr. Alinda Money with Urology on 10/25 and he recommends OP evaluation for same especially as hematuria seems to have been transient.   Confusion/Acute Encephalopathy Initially thought to be secondary to narcotic pain medications.  These were stopped. MRI brian was done due to persistent symptoms.  MRI was negative. Stable with occasional periods of confusion. Patient likely has underlying cognitive impairment. Also stopped Benadryl which could have contributed as well.  Sinus Bradycardia Asymptomatic. Not on rate limiting medications.  DVT proph: SCD's Code Status: Full Code Family Communication: Discussed with patient and wife at bedside Disposition Plan: SNF or CIR. Rehab to discuss with General Surgeon regarding timing of surgery which will weigh in on decision to take him to CIR.   Procedures IR placed drain in liver lesion.  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 Denies any pain.   Objective: Filed Vitals:   03/07/14 1426  BP: 161/64  Pulse: 73  Temp: 98.1 F (36.7 C)  Resp: 18    Intake/Output Summary (Last 24 hours) at 03/07/14 1808 Last data filed at 03/07/14 1400  Gross per 24 hour  Intake   1980 ml  Output   1150 ml  Net    830 ml   Filed Weights   03/05/14 0342 03/06/14 0342 03/07/14 0351  Weight: 81.3 kg (179 lb 3.7 oz) 80.4 kg (177 lb 4 oz) 81.9 kg (180 lb 8.9 oz)    Exam:   General:  alert and conversant, but confused. Recognized his wife. Knew he was in [place]. Couldn't tell me the date, month, year.  Cardiovascular: S1S2/RRR  Respiratory: CTA bilaterally  Abdomen: soft, mild tenderness in RUQ, BS present.  Musculoskeletal: no edema  Data Reviewed: Basic Metabolic Panel:  Recent Labs Lab 03/01/14 0448 03/02/14 0345 03/03/14 0252 03/04/14 0407 03/05/14 0320 03/06/14 0521 03/07/14 1021  NA 142 141 144 142 141 141 138  K 3.8 3.4* 3.5* 3.0* 3.2* 3.6* 3.6*  CL 100 98 103 102 102 104 102  CO2 29 27 26 27 25 25 23   GLUCOSE 95 82 89 121* 111* 116* 103*  BUN 31* 31* 31* 28* 23 20 19   CREATININE 4.02* 4.12* 4.06* 3.94* 3.78* 3.63* 3.44*  CALCIUM 7.9* 8.2* 8.4 8.2* 8.2* 8.2* 8.3*  PHOS 3.5 4.9*   --   --   --  3.0 3.2   Liver Function Tests:  Recent Labs Lab 03/01/14 0448 03/01/14 2000 03/02/14 0345 03/06/14 0521 03/07/14 1021  AST  --  22 20  --   --   ALT  --  18 17  --   --   ALKPHOS  --  127* 125*  --   --   BILITOT  --  0.4 0.4  --   --   PROT  --  5.5* 5.5*  --   --   ALBUMIN 1.7* 1.9* 2.0*  2.0* 1.9* 1.9*   CBC:  Recent Labs Lab 03/02/14 0345 03/03/14 0252 03/04/14 0407 03/05/14 0320 03/07/14 1020  WBC 9.1 10.6* 9.5 9.4 9.2  NEUTROABS  --   --   --   --  5.0  HGB 8.2* 8.1* 8.2* 8.5* 7.4*  HCT 26.1* 24.6* 25.4* 26.3* 22.7*  MCV 79.3 79.1 78.9 79.5 80.8  PLT 251 292 312 270 188   Cardiac Enzymes: No results found for this basename: CKTOTAL, CKMB, CKMBINDEX, TROPONINI,  in the last 168 hours   Recent Results (from the past 240 hour(s))  URINE CULTURE     Status: None   Collection Time    03/05/14 12:10 PM  Result Value Ref Range Status   Specimen Description URINE, RANDOM   Final   Special Requests NONE   Final   Culture  Setup Time     Final   Value: 03/05/2014 21:04     Performed at [facility] PENDING   Incomplete   Culture     Final   Value: Culture reincubated for better growth     Performed at [facility]   Report Status PENDING   Incomplete     Studies: No results found.  Scheduled Meds: . feeding supplement (ENSURE COMPLETE)  237 mL Oral TID BM  . Influenza vac split quadrivalent PF  0.5 mL Intramuscular Tomorrow-1000  . pneumococcal 23 valent vaccine  0.5 mL Intramuscular Tomorrow-1000  . potassium chloride  40 mEq Oral Once  . terazosin  5 mg Oral Daily   Continuous Infusions:   Antibiotics Given (last 72 hours)   None      Principal Problem:   Essential hypertension Active Problems:   Liver abscess   BPH (benign prostatic hyperplasia)   Colonic mass   Acute renal failure   Hematuria  Time spent: 39min   [facility]   [facility]  Pager [phone]  If 7PM-7AM,  please contact night-coverage at [email or web address], password [facility] 03/07/2014, 6:08 PM  LOS: 20 days

## 2014-03-07 NOTE — Progress Notes (Signed)
Called Dr. Jimmy Footman and IV RN and picc line is not an option.  Dr. Jimmy Footman suggested tunneled central line.  Called PA [place] and options are being discussed. Pt resting with call bell within reach.  Will continue to monitor. Payton Emerald, RN

## 2014-03-07 NOTE — Progress Notes (Signed)
Inpatient Rehabilitation  I have further discussed Mr. Brent Little's case with Saverio Danker, PA.  Pt. Was unable to tolerate/refused Panda insertion.  Brent question of PICC with TPN was raised , however I received an update from [place] that pt.will not have TPN.  We discussed Brent need for an established means of nutrition for him to be able to participate in rigors of CIR.  Brent Little will discuss with Dr. Hulen Skains.  I have updated Brent Little and Ellan Lambert CM.  Danne [place] will follow up tomorrow and can be reached at [phone].  Please call if questions.    [place] Admissions Coordinator Cell [phone] Office [phone]

## 2014-03-07 NOTE — Progress Notes (Signed)
PARENTERAL NUTRITION CONSULT NOTE - INITIAL  Pharmacy Consult for TPN Indication: poor po intake, refusal of panda tube placement, needs surgery for colon masses  No Known Allergies  Patient Measurements: Height: 6' (182.9 cm) Weight: 180 lb 8.9 oz (81.9 kg) IBW/kg (Calculated) : 77.6   Vital Signs: Temp: 98.7 F (37.1 C) (10/28 0351) Temp Source: Oral (10/28 0351) BP: 163/70 mmHg (10/28 0351) Pulse Rate: 79 (10/28 0351) Intake/Output from previous day: 10/27 0701 - 10/28 0700 In: 1920 [P.O.:720; I.V.:1200] Out: 350 [Urine:350] Intake/Output from this shift: Total I/O In: 420 [P.O.:420] Out: 650 [Urine:650]  Labs:  Recent Labs  03/05/14 0320 03/07/14 1020  WBC 9.4 9.2  HGB 8.5* 7.4*  HCT 26.3* 22.7*  PLT 270 188     Recent Labs  03/05/14 0320 03/06/14 0521 03/07/14 1021  NA 141 141 138  K 3.2* 3.6* 3.6*  CL 102 104 102  CO2 25 25 23   GLUCOSE 111* 116* 103*  BUN 23 20 19   CREATININE 3.78* 3.63* 3.44*  CALCIUM 8.2* 8.2* 8.3*  PHOS  --  3.0 3.2  ALBUMIN  --  1.9* 1.9*  PREALBUMIN 6.1*  --   --    Estimated Creatinine Clearance: 20.7 ml/min (by C-G formula based on Cr of 3.44).   No results found for this basename: GLUCAP,  in the last 72 hours  Medical History: Past Medical History  Diagnosis Date  . Hypertension   . Acute renal failure 02/24/2014    Insulin Requirements in the past 24 hours:  none Current Nutrition:  Refused panda tube, po intake 0-50%  Admit:  Colon mass on CT, Surgery deferred due to AKI and nutritional status GI: Alb 1.9, prealb 6.1  Poor po intake Endo: no h/o DM  Am glucose 103 Lytes:  K 3.6, Phos 3.2, CoCa 9.98,  Renal:  AKI/CKD in setting of ACE, dehydration and vancomycin therapy.  SrCr 3.44 today Cards:  BP 163/70, HR 79  ACE and HCTZ on hold Hepatobil: Alk phos sl elevated Neuro:  likely delerium ID:   Now off antibiotics, Afeb, WBC wnl TPN Access: PICC line to be placed today  TPN day#:0   Assessment: 73  yo man admitted with necrotic liver mass, colonic mass and possible CXR infiltrate.  He had a colonoscopy 10/23 which showed synchronous colon lesions.  He developed AKI on CKD and has had poor po intake during this stay so surgery has been deferred at this time. He refused to have panda tube placed and orders for TPN and PICC line today.  He is at high risk for refeeding syndrome.  Nutritional Goals:  2100-2300  kCal, 125-135 grams of protein per day  Plan:  PICC line not yet placed so will not start TPN today. Will check am labs.   Manjinder Breau [place] 03/07/2014,1:30 PM

## 2014-03-07 NOTE — Progress Notes (Signed)
Physical Therapy Treatment Patient Details Name: Brent Little MRN: [id number] DOB: Nov 12, 1940 Today's Date: 03/07/2014    History of Present Illness Pt admitted with fever chills, decreased appetite and lethargy.  Liver mass noted on CT and diagnosed as an abcess after biopsy.GI was reconsulted and he underwent colonoscopy 10/23 which revealed colon cancer.    PT Comments    Patient progressing towards functional physical therapy goals, increasing ambulatory endurance. Balance improving, specifically with use of a rolling walker, however continues to require min assist for loss of balance without this device. Tolerating therapeutic exercises well. Continues to be disoriented however pt is very pleasant today. Patient will continue to benefit from skilled physical therapy services to further improve independence with functional mobility.   Follow Up Recommendations  CIR     Equipment Recommendations  None recommended by PT    Recommendations for Other Services Rehab consult     Precautions / Restrictions Precautions Precautions: Fall Restrictions Weight Bearing Restrictions: No    Mobility  Bed Mobility Overal bed mobility: Needs Assistance Bed Mobility: Supine to Sit     Supine to sit: Supervision     General bed mobility comments: Supervision with cues for sequencing to exit bed. No physical assist required.  Transfers Overall transfer level: Needs assistance Equipment used: Rolling walker (2 wheeled) Transfers: Sit to/from Stand Sit to Stand: Min guard         General transfer comment: Min guard for safety. VC for hand placement. Leans posteriorly on heels initially but was able to self correct today.  Ambulation/Gait Ambulation/Gait assistance: Min assist Ambulation Distance (Feet): 325 Feet (10 feet in room without rolling walker) Assistive device: Rolling walker (2 wheeled);None Gait Pattern/deviations: Step-through pattern;Narrow base of support;Staggering  right   Gait velocity interpretation: Below normal speed for age/gender General Gait Details: Short distance trialed without an assistive device and required min assist to correct balance loss towards patients right side. Much improved stability today with use of a rolling walker, ambulating at a min guard level for majority of bout. Required cues for redirection, to widen base of support, and to keep walker placed on ground while turning.   Stairs            Wheelchair Mobility    Modified Rankin (Stroke Patients Only)       Balance                                    Cognition Arousal/Alertness: Awake/alert Behavior During Therapy: WFL for tasks assessed/performed Overall Cognitive Status: Impaired/Different from baseline Area of Impairment: Orientation;Memory;Following commands;Safety/judgement;Awareness;Problem solving Orientation Level: Disoriented to;Time;Situation;Place   Memory: Decreased short-term memory;Decreased recall of precautions Following Commands: Follows multi-step commands inconsistently Safety/Judgement: Decreased awareness of safety;Decreased awareness of deficits Awareness: Intellectual;Anticipatory Problem Solving: Slow processing;Decreased initiation;Difficulty sequencing;Requires verbal cues      Exercises General Exercises - Lower Extremity Ankle Circles/Pumps: AROM;Both;10 reps;Supine Heel Slides: AAROM;Both;10 reps;Strengthening;Supine Hip ABduction/ADduction: Strengthening;Both;10 reps;Supine Straight Leg Raises: Strengthening;Both;10 reps;Supine    General Comments        Pertinent Vitals/Pain Pain Assessment: No/denies pain Pain Intervention(s): Monitored during session    Home Living                      Prior Function            PT Goals (current goals can now be found in the care plan section) Acute Rehab PT  Goals PT Goal Formulation: With patient/family Time For Goal Achievement:  03/08/14 Potential to Achieve Goals: Good Progress towards PT goals: Progressing toward goals    Frequency  Min 3X/week    PT Plan Current plan remains appropriate    Co-evaluation             End of Session Equipment Utilized During Treatment: Gait belt Activity Tolerance: Patient tolerated treatment well Patient left: with family/visitor present;with call bell/phone within reach;in chair;with chair alarm set     Time: 1638-4665 PT Time Calculation (min): 23 min  Charges:  $Gait Training: 8-22 mins $Therapeutic Exercise: 8-22 mins                    G Codes:       [employer], [place]  Ellouise Newer 03/07/2014, 3:30 PM

## 2014-03-07 NOTE — Progress Notes (Addendum)
NUTRITION CONSULT/FOLLOW UP  INTERVENTION:  Once Panda tube placed & verified, initiate Vital AF 1.2 formula at 15 ml/hr and increase by 10 ml every 4 hours to goal rate of 75 ml/hr to provide 2160 kcals, 135 gm protein, 1459 ml of free water  Continue Ensure Complete po TID, each supplement provides 350 kcal and 13 grams of protein RD to follow for nutrition care plan  NUTRITION DIAGNOSIS: Inadequate oral intake related to ascending colon mass as evidenced by PO intake 0-50%,ongoing  Goal: Pt to meet >/= 90% of their estimated nutrition needs, unmet  Monitor:  TF regimen & tolerance, PO intake, weight, labs, I/O's  ASSESSMENT: 73 y.o. male, With H/O HTN, Kidney stone, who initially presented to [place] with fever, dull right upper quadrant pain and a dry cough. He was diagnosed there with a possible community-acquired pneumonia, was placed on Levaquin, he had marginal improvement. During his fever workup he had a right upper quadrant ultrasound which showed some nonspecific changes . He was then discharged home with outpatient MRI. MRI done in the outpatient setting showed a septated cystic lesion in the right hepatic lobe concerning for abscess versus metastatic necrotic mass. MRI also showed an ascending colon circumferential mass. He presented to [facility] with fever, chills, decreased appetite and lethargy.   Calorie count completed 10/26--10/27.  Pt continues on a Dys 3, thin liquid diet.  PO intake poor at 0-50% per flowsheet records.  Per RN, he is taking Ensure Complete with ice cream.  Will re-order TID between meals.  Goal is for patient to improve his nutritional status in preparation for surgery in the future.  Plan is for [facility] tube placement today under fluoro.  RD consulted for TF initiation & management.  Disposition: LTACH vs CIR.  Height: Ht Readings from Last 1 Encounters:  02/15/14 6' (1.829 m)    Weight: Wt Readings from Last 1 Encounters:  03/07/14  180 lb 8.9 oz (81.9 kg)    BMI:  Body mass index is 24.48 kg/(m^2).  Re-estimated Nutritional Needs: Kcal: 2100-2300 Protein: 125-135 gm Fluid: 2.1-2.3 L/day  Skin: Intact  Diet Order: Dysphagia 3, thin liquids   Intake/Output Summary (Last 24 hours) at 03/07/14 1013 Last data filed at 03/07/14 0900  Gross per 24 hour  Intake   2220 ml  Output    100 ml  Net   2120 ml    Labs:   Recent Labs Lab 03/01/14 0448 03/02/14 0345  03/04/14 0407 03/05/14 0320 03/06/14 0521  NA 142 141  < > 142 141 141  K 3.8 3.4*  < > 3.0* 3.2* 3.6*  CL 100 98  < > 102 102 104  CO2 29 27  < > 27 25 25   BUN 31* 31*  < > 28* 23 20  CREATININE 4.02* 4.12*  < > 3.94* 3.78* 3.63*  CALCIUM 7.9* 8.2*  < > 8.2* 8.2* 8.2*  PHOS 3.5 4.9*  --   --   --  3.0  GLUCOSE 95 82  < > 121* 111* 116*  < > = values in this interval not displayed.   Scheduled Meds: . feeding supplement (RESOURCE BREEZE)  1 Container Oral TID BM  . Influenza vac split quadrivalent PF  0.5 mL Intramuscular Tomorrow-1000  . pneumococcal 23 valent vaccine  0.5 mL Intramuscular Tomorrow-1000  . terazosin  5 mg Oral Daily    Continuous Infusions:    Past Medical History  Diagnosis Date  . Hypertension   . Acute  renal failure 02/24/2014    Past Surgical History  Procedure Laterality Date  . Tonsillectomy    . Colonoscopy with propofol N/A 03/02/2014    Procedure: COLONOSCOPY WITH PROPOFOL;  Surgeon: Missy Sabins, MD;  Location: [facility];  Service: Endoscopy;  Laterality: N/A;    Arthur Holms, RD, LDN Pager #: [phone] After-Hours Pager #: [phone]

## 2014-03-07 NOTE — Progress Notes (Signed)
ICU RN attempted to place panda and patient unable to tolerate and refused. Called PA Jocelyn Lamer and received new orders. Discussed with pt and wife. Ordered picc line and consult to pharmacy for feeding. Pt resting with call bell within reach.  Will continue to monitor. Payton Emerald, RN

## 2014-03-07 NOTE — Progress Notes (Signed)
Inpatient Rehabilitation  I discussed pt's case this am with Saverio Danker, surgery PA.  Plan is for [facility] today under fluoro with goal of improving nutritional status in preparation for surgery in the future.  I also discussed this case with my rehab team and with Ellan Lambert RN CM.  Almyra Free will contact LTACH to have patient looked at . At this point, I am concerned that pt. may need longer time for adequate recovery (than available in the CIR setting), for wife to be able to manage him at home and in preparation for surgery.  Will await Panda placement and LTACH input.  I have updated Mr.and Mrs. Barron on these plans.  Please call if questions.  [place] Admissions Coordinator Cell [phone] Office [phone]

## 2014-03-07 NOTE — Progress Notes (Signed)
Agree with above.  Montrice O. Theoden Mauch, III, MD, FACS [phone]--pager [phone]--office [facility]  

## 2014-03-07 NOTE — Progress Notes (Signed)
S: Pt has no complaints this am. Per wife he was wheezing all night and asking "where is the cat?" because he did not realize the sound was coming from him. She is very tearful this morning about his decline. O:BP 163/70  Pulse 79  Temp(Src) 98.7 F (37.1 C) (Oral)  Resp 18  Ht 6' (1.829 m)  Wt 180 lb 8.9 oz (81.9 kg)  BMI 24.48 kg/m2  SpO2 94%  Intake/Output Summary (Last 24 hours) at 03/07/14 0752 Last data filed at 03/07/14 0646  Gross per 24 hour  Intake   1800 ml  Output    100 ml  Net   1700 ml   Intake/Output: I/O last 3 completed shifts: In: 2120 [P.O.:920; I.V.:1200] Out: 825 [Urine:825]  Intake/Output this shift:    Weight change: 3 lb 4.9 oz (1.5 kg) Gen:NAD, thin elderly gentleman, lying in bed, pale, somewhat confused CVS:RRR, 2/6 M   Resp: decreased bs bilaterally R>L WNI:OEVO, NTND, +BS Liver down 4 cm Ext:WWP,1+ edema   Recent Labs Lab 02/28/14 0854 03/01/14 0448 03/01/14 2000 03/02/14 0345 03/03/14 0252 03/04/14 0407 03/05/14 0320 03/06/14 0521  NA 139 142  --  141 144 142 141 141  K 3.9 3.8  --  3.4* 3.5* 3.0* 3.2* 3.6*  CL 102 100  --  98 103 102 102 104  CO2 24 29  --  27 26 27 25 25   GLUCOSE 115* 95  --  82 89 121* 111* 116*  BUN 32* 31*  --  31* 31* 28* 23 20  CREATININE 3.91* 4.02*  --  4.12* 4.06* 3.94* 3.78* 3.63*  ALBUMIN 1.9* 1.7* 1.9* 2.0*  2.0*  --   --   --  1.9*  CALCIUM 8.1* 7.9*  --  8.2* 8.4 8.2* 8.2* 8.2*  PHOS 3.5 3.5  --  4.9*  --   --   --  3.0  AST  --   --  22 20  --   --   --   --   ALT  --   --  18 17  --   --   --   --    Liver Function Tests:  Recent Labs Lab 03/01/14 2000 03/02/14 0345 03/06/14 0521  AST 22 20  --   ALT 18 17  --   ALKPHOS 127* 125*  --   BILITOT 0.4 0.4  --   PROT 5.5* 5.5*  --   ALBUMIN 1.9* 2.0*  2.0* 1.9*   No results found for this basename: LIPASE, AMYLASE,  in the last 168 hours No results found for this basename: AMMONIA,  in the last 168 hours CBC:  Recent Labs Lab  03/01/14 0448 03/02/14 0345 03/03/14 0252 03/04/14 0407 03/05/14 0320  WBC 8.4 9.1 10.6* 9.5 9.4  HGB 7.8* 8.2* 8.1* 8.2* 8.5*  HCT 23.6* 26.1* 24.6* 25.4* 26.3*  MCV 78.1 79.3 79.1 78.9 79.5  PLT 224 251 292 312 270   Cardiac Enzymes: No results found for this basename: CKTOTAL, CKMB, CKMBINDEX, TROPONINI,  in the last 168 hours CBG: No results found for this basename: GLUCAP,  in the last 168 hours  Iron Studies:  No results found for this basename: IRON, TIBC, TRANSFERRIN, FERRITIN,  in the last 72 hours Studies/Results: No results found. . feeding supplement (RESOURCE BREEZE)  1 Container Oral TID BM  . Influenza vac split quadrivalent PF  0.5 mL Intramuscular Tomorrow-1000  . pneumococcal 23 valent vaccine  0.5 mL Intramuscular Tomorrow-1000  .  terazosin  5 mg Oral Daily    Assessment/Plan: 73yo man who presented with 1 month FTT found to have a liver abscess and colon masses.  1. AKI/CKD in setting of volume depletion/hypotension/Ace-inhibition/NSAIDs/vanc toxicity. Continue to hold vanco and renal dose medication.  1. Stop IV fluids, euvolemic 2. Kidney function slowly better by trend.  3. Per wife is having many very small voids, bladder scan now, if normal we will sign off today 2. Hematuria - resolved, but now with possible BOO, recommend urology consult 3. Anemia- normocytic-?GIB vs due to malignancy  4. HTN- stable, cont to hold ACE, avoid low bp 5. Encephalopathy-MRI brain negative, likely delirium 6. N/V/D/protein malnutrition- per GI and primary - awaiting 24 hour calorie count and decision on TNA 7. Colon mass- circumferential ascending and smaller descending colon masses, surgery consulted and will defer until strength improved with nutrition and rehab 8. Wheezing/decreased BS: consider CXR to eval for possible pneumonia  Beverlyn Roux I have seen and examined this patient and agree with the plan of care seen, eval, examined, discussed with wife and resident.  .  Cambreigh Dearing,Burt L 03/07/2014, 1:07 PM

## 2014-03-08 ENCOUNTER — Inpatient Hospital Stay (HOSPITAL_COMMUNITY): Payer: Medicare Other

## 2014-03-08 LAB — BASIC METABOLIC PANEL
ANION GAP: 13 (ref 5–15)
BUN: 22 mg/dL (ref 6–23)
CHLORIDE: 105 meq/L (ref 96–112)
CO2: 24 mEq/L (ref 19–32)
Calcium: 8.9 mg/dL (ref 8.4–10.5)
Creatinine, Ser: 3.49 mg/dL — ABNORMAL HIGH (ref 0.50–1.35)
GFR calc Af Amer: 18 mL/min — ABNORMAL LOW (ref >90)
GFR calc non Af Amer: 16 mL/min — ABNORMAL LOW (ref >90)
Glucose, Bld: 118 mg/dL — ABNORMAL HIGH (ref 70–99)
Potassium: 4.2 mEq/L (ref 3.7–5.3)
Sodium: 142 mEq/L (ref 137–147)

## 2014-03-08 MED ORDER — HALOPERIDOL LACTATE 5 MG/ML IJ SOLN
2.0000 mg | Freq: Four times a day (QID) | INTRAMUSCULAR | Status: DC | PRN
  Administered 2014-03-08: 2 mg via INTRAVENOUS
  Filled 2014-03-08 (×2): qty 0.4

## 2014-03-08 MED ORDER — TAMSULOSIN HCL 0.4 MG PO CAPS
0.4000 mg | ORAL_CAPSULE | Freq: Every day | ORAL | Status: DC
  Administered 2014-03-08 – 2014-03-09 (×2): 0.4 mg via ORAL
  Filled 2014-03-08 (×2): qty 1

## 2014-03-08 MED ORDER — FINASTERIDE 5 MG PO TABS
5.0000 mg | ORAL_TABLET | Freq: Every day | ORAL | Status: DC
  Administered 2014-03-08 – 2014-03-09 (×2): 5 mg via ORAL
  Filled 2014-03-08 (×2): qty 1

## 2014-03-08 NOTE — Progress Notes (Addendum)
[facility] PROGRESS NOTE  Dvid Pendry WEX:[id number] DOB: [birth year] DOA: 02/15/2014  PCP: [name], MD  Brief Narrative: Royal Vandevoort is a 73 y.o. male, With H/O HTN, Kidney stone, who initially presented to [facility] with fever, dull right upper quadrant pain and a dry cough. He was diagnosed there with a possible community-acquired pneumonia, was placed on Levaquin, he had marginal improvement. During his fever workup he had a right upper quadrant ultrasound which showed some nonspecific changes . He was then discharged home with outpatient MRI. MRI done in the outpatient setting showed a septated cystic lesion in the right hepatic lobe concerning for abscess versus metastatic necrotic mass. MRI also showed an ascending colon circumferential mass. He presented to [facility] with fever, chills, decreased appetite and lethargy. He was followed by [facility]/GI and IR. He has been on broad spectrum Abx, finally underwent Liver biopsy 10/12. And had a drain put it. His creatinine started creeping up on 10/16. Vancomycin trough came back elevated. Stopped vancomycin and Nephrology was consulted. His blood cultures and cultures from the liver abscess have all come back negative and zosyn was discontinued on 10/19 after completing 10 days of IV antibiotics. Then he had minimal rectal bleeding. GI was reconsulted and he underwent colonoscopy 10/23 which revealed colon cancer. His renal function is slowly improving. Plan is for patient's nutritional status to improve prior to undergoing surgery. Tried putting the panda tube and he refused. Plan will be to go home with home PT and outpatient follo wup with surgery.   Assessment/Plan:  Colon Cancer/Hematochezia GI consulted for colonoscopy. They initially recommended outpatient colonoscopy when he recovers from the liver issue. However he developed mild rectal bleeding. Colonoscopy done on 10/23 which revealed colon cancer. CEA mildly elevated at 9. No  further blood in stool. Surgery was consulted. It is felt that his impaired renal function could get worse if surgery is done now. Plus patient's nutritional status needs to be better as well. Patient might have to go to SNF/Rehab for few weeks to improve his functional status. Calorie count in progress. His oral intake continues to be poor. General surgery considering alterative modality of nutrition. Tried panda tube , which he fought against and refused to try again. Not a great candidate for PICC line and TNA placement.   Acute renal failure Probably secondary to vancomycin toxicity vs ace inhibition vs NSAID use vs volume depletion. [place] was negative for hydronephrosis. UA is negative. Nephrology following. Was given HCO3 infusion. Renal function is improving slowly though not back to baseline.   Liver abscess vs necrotic mets He underwent US guided biopsy 10/12; yielded purulent appearing material. 40F drain was placed under US guidance. Culture and gram stain were sent. No organisms seen. Blood cultures are  negative. Cytology was negative for malignant cells. He completed 10 days of IV antibiotics. Vancomycin was stopped on 10/16 after the trough came back elevated. A repeat CT abdomen and pelvis without contrast showed decrease in the size of the collection. Drain removed 10/21.   Nausea/Poor Appetite/Severe Protein Calorie malnutrition Likely due to acute illness, ARF. US shows gallstones but no acute inflammation. LFT's normal. Diet advanced. Calorie count.   Essential HTN BP fluctuates but stable. ACE on hold  Iron Deficiency Anemia Hgb noted to be lower but stable. Monitor for now.   Dysuria/Hematuria He had transient hematuria. Now resolved. He is noted to have renal cysts and enlarged prostate on [place]. These could have contributed. Discussed with Dr. Alinda Money with  Urology on 10/25 and he recommends OP evaluation for same especially as hematuria seems to have been transient.    Confusion/Acute Encephalopathy Initially thought to be secondary to narcotic pain medications. These were stopped. MRI brian was done due to persistent symptoms. MRI was negative. Stable with occasional periods of confusion. Patient likely has underlying cognitive impairment. Also stopped Benadryl which could have contributed as well.  Sinus Bradycardia Asymptomatic. Not on rate limiting medications.  Hematuria:  And post void residual volume of 424ml. Repeat US renal ordered and UA . Urology consutled for recommendations.   DVT proph: SCD's Code Status: Full Code Family Communication: Discussed with patient and wife and son at bedside Disposition Plan: [facility] could not take him, not a candidate for SNF, pt's family wants to take him home.   Procedures IR placed drain in liver lesion.  Colonoscopy 10/23 Synchronous colon cancers in the ascending and descending colon    Consultants:  GI  [facility]S  IR  Subjective: He wants to go home.   Objective: Filed Vitals:   03/08/14 1354  BP: 155/69  Pulse: 77  Temp: 98 F (36.7 C)  Resp: 20    Intake/Output Summary (Last 24 hours) at 03/08/14 1559 Last data filed at 03/08/14 0855  Gross per 24 hour  Intake    960 ml  Output   2050 ml  Net  -1090 ml   Filed Weights   03/06/14 0342 03/07/14 0351 03/08/14 8416  Weight: 80.4 kg (177 lb 4 oz) 81.9 kg (180 lb 8.9 oz) 81.6 kg (179 lb 14.3 oz)    Exam:   General:  alert and conversant, but confused. Recognized his wife. Knew he was in [place]. Couldn't tell me the date, month, year.  Cardiovascular: S1S2/RRR  Respiratory: CTA bilaterally  Abdomen: soft, mild tenderness in RUQ, BS present.  Musculoskeletal: no edema  Data Reviewed: Basic Metabolic Panel:  Recent Labs Lab 03/02/14 0345  03/04/14 0407 03/05/14 0320 03/06/14 0521 03/07/14 1021 03/08/14 0324  NA 141  < > 142 141 141 138 142  K 3.4*  < > 3.0* 3.2* 3.6* 3.6* 4.2  CL 98  < > 102 102 104 102 105   CO2 27  < > 27 25 25 23 24   GLUCOSE 82  < > 121* 111* 116* 103* 118*  BUN 31*  < > 28* 23 20 19 22   CREATININE 4.12*  < > 3.94* 3.78* 3.63* 3.44* 3.49*  CALCIUM 8.2*  < > 8.2* 8.2* 8.2* 8.3* 8.9  PHOS 4.9*  --   --   --  3.0 3.2  --   < > = values in this interval not displayed. Liver Function Tests:  Recent Labs Lab 03/01/14 2000 03/02/14 0345 03/06/14 0521 03/07/14 1021  AST 22 20  --   --   ALT 18 17  --   --   ALKPHOS 127* 125*  --   --   BILITOT 0.4 0.4  --   --   PROT 5.5* 5.5*  --   --   ALBUMIN 1.9* 2.0*  2.0* 1.9* 1.9*   CBC:  Recent Labs Lab 03/02/14 0345 03/03/14 0252 03/04/14 0407 03/05/14 0320 03/07/14 1020  WBC 9.1 10.6* 9.5 9.4 9.2  NEUTROABS  --   --   --   --  5.0  HGB 8.2* 8.1* 8.2* 8.5* 7.4*  HCT 26.1* 24.6* 25.4* 26.3* 22.7*  MCV 79.3 79.1 78.9 79.5 80.8  PLT 251 292 312 270 188   Cardiac  Enzymes: No results found for this basename: CKTOTAL, CKMB, CKMBINDEX, TROPONINI,  in the last 168 hours   Recent Results (from the past 240 hour(s))  URINE CULTURE     Status: None   Collection Time    03/05/14 12:10 PM      Result Value Ref Range Status   Specimen Description URINE, RANDOM   Final   Special Requests NONE   Final   Culture  Setup Time     Final   Value: 03/05/2014 21:04     Performed at [facility]     Final   Value: >=100,000 COLONIES/ML     Performed at [facility]   Culture     Final   Value: [place]     Performed at [facility]   Report Status PENDING   Incomplete     Studies: No results found.  Scheduled Meds: . feeding supplement (ENSURE COMPLETE)  237 mL Oral TID BM  . Influenza vac split quadrivalent PF  0.5 mL Intramuscular Tomorrow-1000  . pneumococcal 23 valent vaccine  0.5 mL Intramuscular Tomorrow-1000  . terazosin  5 mg Oral Daily   Continuous Infusions:   Antibiotics Given (last 72 hours)   None      Principal Problem:   Essential  hypertension Active Problems:   Liver abscess   BPH (benign prostatic hyperplasia)   Colonic mass   Acute renal failure   Hematuria  Time spent: 58min   [facility]   [facility]  Pager [phone]  If 7PM-7AM, please contact night-coverage at [email or web address], password [facility] 03/08/2014, 3:59 PM  LOS: 21 days

## 2014-03-08 NOTE — Progress Notes (Signed)
I met with Brent Danker, PA at bedside with pt, wife, and son. Pt functionally is doing well at min guard assist level with P.T. Yesterday. Does need overall rehab but more appropriate at a SNF or [place] level at this time. Home environment with family 24/7 support and nutritional support is recomended. Son lives with his parents and he works from his office on the same property. We will sign off. I have discussed with RN [place] and SW. [phone]

## 2014-03-08 NOTE — Progress Notes (Signed)
Patient ID: Brent Little, male   DOB: 07/03/1940, 73 y.o.   MRN: [id number] 6 Days Post-Op  Subjective: Patient feels ok sitting up in chair.    Objective: Vital signs in last 24 hours: Temp:  [98.1 F (36.7 C)-99.4 F (37.4 C)] 98.7 F (37.1 C) (10/29 3810) Pulse Rate:  [70-84] 70 (10/29 0632) Resp:  [18-20] 18 (10/29 1751) BP: (145-161)/(62-66) 150/66 mmHg (10/29 1042) SpO2:  [94 %-95 %] 95 % (10/29 0258) Weight:  [179 lb 14.3 oz (81.6 kg)] 179 lb 14.3 oz (81.6 kg) (10/29 5277) Last BM Date: 03/07/14  Intake/Output from previous day: 10/28 0701 - 10/29 0700 In: 1140 [P.O.:1140] Out: 2800 [Urine:2800] Intake/Output this shift: Total I/O In: -  Out: 100 [Urine:100]  PE: Abd: soft, NT, ND, +BS  Lab Results:   Recent Labs  03/07/14 1020  WBC 9.2  HGB 7.4*  HCT 22.7*  PLT 188   BMET  Recent Labs  03/07/14 1021 03/08/14 0324  NA 138 142  K 3.6* 4.2  CL 102 105  CO2 23 24  GLUCOSE 103* 118*  BUN 19 22  CREATININE 3.44* 3.49*  CALCIUM 8.3* 8.9   PT/INR No results found for this basename: LABPROT, INR,  in the last 72 hours CMP     Component Value Date/Time   NA 142 03/08/2014 0324   K 4.2 03/08/2014 0324   CL 105 03/08/2014 0324   CO2 24 03/08/2014 0324   GLUCOSE 118* 03/08/2014 0324   BUN 22 03/08/2014 0324   CREATININE 3.49* 03/08/2014 0324   CALCIUM 8.9 03/08/2014 0324   PROT 5.5* 03/02/2014 0345   ALBUMIN 1.9* 03/07/2014 1021   AST 20 03/02/2014 0345   ALT 17 03/02/2014 0345   ALKPHOS 125* 03/02/2014 0345   BILITOT 0.4 03/02/2014 0345   GFRNONAA 16* 03/08/2014 0324   GFRAA 18* 03/08/2014 0324   Lipase  No results found for this basename: lipase       Studies/Results: No results found.  Anti-infectives: Anti-infectives   Start     Dose/Rate Route Frequency Ordered Stop   02/26/14 1600  piperacillin-tazobactam (ZOSYN) IVPB 2.25 g  Status:  Discontinued     2.25 g 100 mL/hr over 30 Minutes Intravenous 3 times per day 02/26/14 1222  02/26/14 1849   02/18/14 1800  vancomycin (VANCOCIN) IVPB 750 mg/150 ml premix  Status:  Discontinued     750 mg 150 mL/hr over 60 Minutes Intravenous Every 12 hours 02/18/14 1726 02/24/14 1650   02/16/14 0000  piperacillin-tazobactam (ZOSYN) IVPB 3.375 g  Status:  Discontinued     3.375 g 12.5 mL/hr over 240 Minutes Intravenous 3 times per day 02/15/14 1516 02/26/14 1222   02/15/14 1600  vancomycin (VANCOCIN) IVPB 1000 mg/200 mL premix  Status:  Discontinued     1,000 mg 200 mL/hr over 60 Minutes Intravenous Every 12 hours 02/15/14 1516 02/18/14 1726   02/15/14 1600  piperacillin-tazobactam (ZOSYN) IVPB 3.375 g     3.375 g 100 mL/hr over 30 Minutes Intravenous  Once 02/15/14 1516 02/15/14 1900       Assessment/Plan   1. 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 Hypoalbuminemia  4. ARF, nonoliguric  5. Anemia  6. Confusion  Plan: 1. I had a long discussion with the patient and his family today.  Due to the patient's confusion, he is really unable to follow much of the conversation.  CIR has turned the  patient down as he is too good.  His options are currently to go home with [facility] or SNF.  If he goes home, we can retry PANDA placement.  If he goes to a SNF, he will likely be forced to maintain his nutrition on his own.  The family is trying to decide which option they would like to pursue.  Once this is all decided, he is surgically stable for dc.  LOS: 21 days    Deyani Hegarty E 03/08/2014, 10:54 AM Pager: [phone]

## 2014-03-08 NOTE — Consult Note (Signed)
Consult: Urinary retention Requested by: Dr. Karleen Hampshire  History of Present Illness: Brent Little is a 73 year old white male with multiple medical problems. His been in the hospital with a liver abscess and colon mass. He's had some confusion. He developed acute renal failure. His creatinine increased from 1.2 a few weeks ago up to 4.1 last week. Since then he's been slowly recovering now down to 3.4 with nephrology following. When his creatinine began to increase he underwent renal ultrasound which showed no hydronephrosis and bilateral renal cysts. He also underwent a CT scan which showed no hydronephrosis but about 106 g prostate. I reviewed the images above studies.  As his kidney function has improved he developed urinary frequency, weak stream and small volume voids. He was up throughout the night with bothersome urinary symptoms per his nurse. Foley catheter was placed and his been much more comfortable although the catheter is bothering him some. His post void residual was 377 mL per the nurse. He underwent urinalysis 3 days ago which was clear.  He has a history of BPH on terazosin.   Past Medical History  Diagnosis Date  . Hypertension   . Acute renal failure 02/24/2014   Past Surgical History  Procedure Laterality Date  . Tonsillectomy    . Colonoscopy with propofol N/A 03/02/2014    Procedure: COLONOSCOPY WITH PROPOFOL;  Surgeon: Missy Sabins, MD;  Location: [facility];  Service: Endoscopy;  Laterality: N/A;    Home Medications:  Prescriptions prior to admission  Medication Sig Dispense Refill  . hydrochlorothiazide (HYDRODIURIL) 25 MG tablet Take 25 mg by mouth daily.      . potassium chloride SA (K-DUR,KLOR-CON) 20 MEQ tablet Take 20 mEq by mouth daily.       . ramipril (ALTACE) 10 MG capsule Take 10 mg by mouth daily.       Marland Kitchen terazosin (HYTRIN) 5 MG capsule Take 5 mg by mouth daily.        Allergies: No Known Allergies  History reviewed. No pertinent family history. Social  History:  reports that he has quit smoking. He does not have any smokeless tobacco history on file. He reports that he does not drink alcohol or use illicit drugs.  ROS: A complete review of systems was performed.  All systems are negative except for pertinent findings as noted. Review of Systems  All other systems reviewed and are negative.    Physical Exam:  Vital signs in last 24 hours: Temp:  [98 F (36.7 C)-99.4 F (37.4 C)] 98 F (36.7 C) (10/29 1354) Pulse Rate:  [70-84] 77 (10/29 1354) Resp:  [18-20] 20 (10/29 1354) BP: (145-155)/(62-69) 155/69 mmHg (10/29 1354) SpO2:  [94 %-95 %] 95 % (10/29 1354) Weight:  [81.6 kg (179 lb 14.3 oz)] 81.6 kg (179 lb 14.3 oz) (10/29 6389) General:  Alert and oriented, No acute distress HEENT: Normocephalic, atraumatic Neck: No JVD or lymphadenopathy Cardiovascular: Regular rate and rhythm Lungs: Regular rate and effort Abdomen: Soft, nontender, nondistended, no abdominal masses Back: No CVA tenderness Extremities: No edema Neurologic: Grossly intact GU: foley in place, urine clear   Laboratory Data:  Results for orders placed during the hospital encounter of 02/15/14 (from the past 24 hour(s))  BASIC METABOLIC PANEL     Status: Abnormal   Collection Time    03/08/14  3:24 AM      Result Value Ref Range   Sodium 142  137 - 147 mEq/L   Potassium 4.2  3.7 - 5.3 mEq/L  Chloride 105  96 - 112 mEq/L   CO2 24  19 - 32 mEq/L   Glucose, Bld 118 (*) 70 - 99 mg/dL   BUN 22  6 - 23 mg/dL   Creatinine, Ser 3.49 (*) 0.50 - 1.35 mg/dL   Calcium 8.9  8.4 - 10.5 mg/dL   GFR calc non Af Amer 16 (*) >90 mL/min   GFR calc Af Amer 18 (*) >90 mL/min   Anion gap 13  5 - 15   Recent Results (from the past 240 hour(s))  URINE CULTURE     Status: None   Collection Time    03/05/14 12:10 PM      Result Value Ref Range Status   Specimen Description URINE, RANDOM   Final   Special Requests NONE   Final   Culture  Setup Time     Final   Value:  03/05/2014 21:04     Performed at [facility]     Final   Value: >=100,000 COLONIES/ML     Performed at [facility]   Culture     Final   Value: [place]     Performed at [facility]   Report Status PENDING   Incomplete   Creatinine:  Recent Labs  03/02/14 0345 03/03/14 0252 03/04/14 0407 03/05/14 0320 03/06/14 0521 03/07/14 1021 03/08/14 0324  CREATININE 4.12* 4.06* 3.94* 3.78* 3.63* 3.44* 3.49*    Impression/Assessment/plan: BPH - I discussed with the patient and his family switching to tamsulosin which will be more potent than the terazosin as well as adding a 5 alpha reductase inhibitor for prostate shrinkage although this will take several months. We discussed the rationale, nature risks and benefits of combination therapy.  Elevated PVR - an elevated post void residual is not of concern so long as there is no bothersome lower urinary tract symptoms, UTI or hydronephrosis. In his case it does seem he had bothersome symptoms and his more restful now with the Foley. I don't think he needs repeat imaging at this point. I would follow urine output and continue foley.  I'll plan to see him back in the office for void trial.   Festus Aloe 03/08/2014, 3:54 PM

## 2014-03-08 NOTE — Progress Notes (Signed)
NUTRITION CONSULT/FOLLOW UP  INTERVENTION:  Continue Ensure Complete po TID, each supplement provides 350 kcal and 13 grams of protein RD to follow for nutrition care plan  NUTRITION DIAGNOSIS: Inadequate oral intake related to ascending colon mass as evidenced by PO intake 0-50%, ongoing  Goal: Pt to meet >/= 90% of their estimated nutrition needs, currently unmet  Monitor:  EN initiation, PO intake, weight, labs, I/O's  ASSESSMENT: 73 y.o. male, With H/O HTN, Kidney stone, who initially presented to [facility] with fever, dull right upper quadrant pain and a dry cough. He was diagnosed there with a possible community-acquired pneumonia, was placed on Levaquin, he had marginal improvement. During his fever workup he had a right upper quadrant ultrasound which showed some nonspecific changes . He was then discharged home with outpatient MRI. MRI done in the outpatient setting showed a septated cystic lesion in the right hepatic lobe concerning for abscess versus metastatic necrotic mass. MRI also showed an ascending colon circumferential mass. He presented to [facility] with fever, chills, decreased appetite and lethargy.   Calorie count completed 10/26--10/27.  Pt continues on a Dys 3, thin liquid diet.  PO intake poor at 0-50% per flowsheet records.  Per RN, he is taking Ensure Complete supplements with ice cream (ordered TID between meals).  Goal is for pt to improve his nutritional status in preparation for surgery in the future.  Pt refused Panda tube placement 10/28.  RD consulted for new TPN.  Per chart review, no plans for TPN and nutrition support plan pending.  Disposition: CIR.  Height: Ht Readings from Last 1 Encounters:  02/15/14 6' (1.829 m)    Weight: Wt Readings from Last 1 Encounters:  03/08/14 179 lb 14.3 oz (81.6 kg)    BMI:  Body mass index is 24.39 kg/(m^2).  Re-estimated Nutritional Needs: Kcal: 2100-2300 Protein: 125-135 gm Fluid: 2.1-2.3  L/day  Skin: Intact  Diet Order: Dysphagia 3, thin liquids   Intake/Output Summary (Last 24 hours) at 03/08/14 1000 Last data filed at 03/08/14 0855  Gross per 24 hour  Intake    720 ml  Output   2900 ml  Net  -2180 ml    Labs:   Recent Labs Lab 03/02/14 0345  03/06/14 0521 03/07/14 1021 03/08/14 0324  NA 141  < > 141 138 142  K 3.4*  < > 3.6* 3.6* 4.2  CL 98  < > 104 102 105  CO2 27  < > 25 23 24   BUN 31*  < > 20 19 22   CREATININE 4.12*  < > 3.63* 3.44* 3.49*  CALCIUM 8.2*  < > 8.2* 8.3* 8.9  PHOS 4.9*  --  3.0 3.2  --   GLUCOSE 82  < > 116* 103* 118*  < > = values in this interval not displayed.   Scheduled Meds: . feeding supplement (ENSURE COMPLETE)  237 mL Oral TID BM  . Influenza vac split quadrivalent PF  0.5 mL Intramuscular Tomorrow-1000  . pneumococcal 23 valent vaccine  0.5 mL Intramuscular Tomorrow-1000  . terazosin  5 mg Oral Daily    Continuous Infusions:    Past Medical History  Diagnosis Date  . Hypertension   . Acute renal failure 02/24/2014    Past Surgical History  Procedure Laterality Date  . Tonsillectomy    . Colonoscopy with propofol N/A 03/02/2014    Procedure: COLONOSCOPY WITH PROPOFOL;  Surgeon: Missy Sabins, MD;  Location: [place];  Service: Endoscopy;  Laterality: N/A;  Arthur Holms, RD, LDN Pager #: [phone] After-Hours Pager #: [phone]

## 2014-03-08 NOTE — Progress Notes (Signed)
A decision needs to be made about placement  CIR is not an option.  No surgery for now.  Kathryne Eriksson. Dahlia Bailiff, MD, [place] [phone] [facility]

## 2014-03-08 NOTE — Progress Notes (Signed)
PVR-363ml per bladder scan.

## 2014-03-08 NOTE — Progress Notes (Signed)
Subjective: Interval History: has complaints voiding freq and urgent.  Objective: Vital signs in last 24 hours: Temp:  [98.1 F (36.7 C)-99.4 F (37.4 C)] 98.7 F (37.1 C) (10/29 3614) Pulse Rate:  [70-84] 70 (10/29 0632) Resp:  [18-20] 18 (10/29 4315) BP: (145-161)/(62-64) 145/64 mmHg (10/29 0632) SpO2:  [94 %-95 %] 95 % (10/29 4008) Weight:  [81.6 kg (179 lb 14.3 oz)] 81.6 kg (179 lb 14.3 oz) (10/29 6761) Weight change: -0.3 kg (-10.6 oz)  Intake/Output from previous day: 10/28 0701 - 10/29 0700 In: 1140 [P.O.:1140] Out: 2700 [Urine:2700] Intake/Output this shift:    General appearance: cooperative and confused Resp: clear to auscultation bilaterally Cardio: S1, S2 normal and systolic murmur: holosystolic 2/6, blowing at apex GI: pos bs, liver down 4 cm, soft Extremities: extremities normal, atraumatic, no cyanosis or edema  Lab Results:  Recent Labs  03/07/14 1020  WBC 9.2  HGB 7.4*  HCT 22.7*  PLT 188   BMET:  Recent Labs  03/07/14 1021 03/08/14 0324  NA 138 142  K 3.6* 4.2  CL 102 105  CO2 23 24  GLUCOSE 103* 118*  BUN 19 22  CREATININE 3.44* 3.49*  CALCIUM 8.3* 8.9   No results found for this basename: PTH,  in the last 72 hours Iron Studies: No results found for this basename: IRON, TIBC, TRANSFERRIN, FERRITIN,  in the last 72 hours  Studies/Results: No results found.  I have reviewed the patient's current medications.  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  Need to r/o obstructive component. Bladder scan not done yest, will try to get today. GFR without change.  Baseline Cr 1.7-2 2 Malnutrition  ? PEG 3 Confusion 4 Probable colon Ca 5 Liver abscesses 6 Anemia P bladder scan, follow Cr.      LOS: 21 days   Jadarian Mckay,Purnell L 03/08/2014,8:32 AM

## 2014-03-09 ENCOUNTER — Inpatient Hospital Stay (HOSPITAL_COMMUNITY): Payer: Medicare Other

## 2014-03-09 LAB — URINE CULTURE: Colony Count: >=100000

## 2014-03-09 LAB — RENAL FUNCTION PANEL
ANION GAP: 13 (ref 5–15)
Albumin: 2.1 g/dL — ABNORMAL LOW (ref 3.5–5.2)
BUN: 23 mg/dL (ref 6–23)
CALCIUM: 8.9 mg/dL (ref 8.4–10.5)
CO2: 24 mEq/L (ref 19–32)
Chloride: 102 mEq/L (ref 96–112)
Creatinine, Ser: 3.34 mg/dL — ABNORMAL HIGH (ref 0.50–1.35)
GFR calc Af Amer: 19 mL/min — ABNORMAL LOW (ref >90)
GFR, EST NON AFRICAN AMERICAN: 17 mL/min — AB (ref >90)
Glucose, Bld: 102 mg/dL — ABNORMAL HIGH (ref 70–99)
PHOSPHORUS: 4 mg/dL (ref 2.3–4.6)
POTASSIUM: 3.8 meq/L (ref 3.7–5.3)
SODIUM: 139 meq/L (ref 137–147)

## 2014-03-09 LAB — CBC
HCT: 22.9 % — ABNORMAL LOW (ref 39.0–52.0)
HEMOGLOBIN: 7.3 g/dL — AB (ref 13.0–17.0)
MCH: 25.1 pg — AB (ref 26.0–34.0)
MCHC: 31.9 g/dL (ref 30.0–36.0)
MCV: 78.7 fL (ref 78.0–100.0)
Platelets: 166 10*3/uL (ref 150–400)
RBC: 2.91 MIL/uL — ABNORMAL LOW (ref 4.22–5.81)
RDW: 16.1 % — ABNORMAL HIGH (ref 11.5–15.5)
WBC: 10.9 10*3/uL — ABNORMAL HIGH (ref 4.0–10.5)

## 2014-03-09 MED ORDER — FINASTERIDE 5 MG PO TABS: 5.0000 mg | ORAL_TABLET | Freq: Every day | ORAL | Status: AC

## 2014-03-09 MED ORDER — TRAZODONE 25 MG HALF TABLET: 25.0000 mg | ORAL_TABLET | Freq: Every evening | ORAL | Status: AC | PRN

## 2014-03-09 MED ORDER — TAMSULOSIN HCL 0.4 MG PO CAPS: 0.4000 mg | ORAL_CAPSULE | Freq: Every day | ORAL | Status: AC

## 2014-03-09 MED ORDER — LEVOFLOXACIN 250 MG PO TABS: 250.0000 mg | ORAL_TABLET | Freq: Every day | ORAL | Status: AC

## 2014-03-09 NOTE — Progress Notes (Signed)
Physical Therapy Treatment Patient Details Name: Brent Little MRN: [id number] DOB: [birth year] Today's Date: 03/09/2014    History of Present Illness Pt admitted with fever chills, decreased appetite and lethargy.  Liver mass noted on CT and diagnosed as an abcess after biopsy.GI was reconsulted and he underwent colonoscopy 10/23 which revealed colon cancer.    PT Comments    Patient sleeping initially and increased time to engage in functional mobility with decreased balance and increased distraction due to needing to urinate and due to his disorientation.  Will benefit from SNF rehab prior to d/c home.  Follow Up Recommendations  SNF     Equipment Recommendations  None recommended by PT    Recommendations for Other Services       Precautions / Restrictions Precautions Precautions: Fall Precaution Comments: mitt restraints Restrictions Weight Bearing Restrictions: No    Mobility  Bed Mobility Overal bed mobility: Needs Assistance Bed Mobility: Supine to Sit     Supine to sit: Min assist Sit to supine: Min assist   General bed mobility comments: guidance assist due to patient sleeping initially and slow to respone to commands  Transfers Overall transfer level: Needs assistance Equipment used: Rolling walker (2 wheeled) Transfers: Sit to/from Stand Sit to Stand: Min assist         General transfer comment: cues for initiation and assist for controlled descent on bed  Ambulation/Gait Ambulation/Gait assistance: Min assist;Mod assist Ambulation Distance (Feet): 300 Feet Assistive device: Rolling walker (2 wheeled);None Gait Pattern/deviations: Step-through pattern     General Gait Details: loss of balance around turn with mod assist for safe recovery.  In room some without walker and wobbly requiring assist for safety/to prevent falls.  More unstable, but was sound asleep in bed (wife trying to feed patient) prior to session; pt highly distracted by his  diorientations   Stairs            Wheelchair Mobility    Modified Rankin (Stroke Patients Only)       Balance Overall balance assessment: Needs assistance         Standing balance support: Bilateral upper extremity supported Standing balance-Leahy Scale: Poor Standing balance comment: loss of balance wih walker                    Cognition Arousal/Alertness: Awake/alert Behavior During Therapy: WFL for tasks assessed/performed Overall Cognitive Status: Impaired/Different from baseline Area of Impairment: Orientation;Memory;Following commands;Safety/judgement;Awareness;Problem solving Orientation Level: Disoriented to;Time;Situation;Place Current Attention Level: Sustained Memory: Decreased short-term memory Following Commands: Follows multi-step commands inconsistently Safety/Judgement: Decreased awareness of safety;Decreased awareness of deficits   Problem Solving: Slow processing;Requires verbal cues      Exercises      General Comments        Pertinent Vitals/Pain Pain Assessment:  (No pain complaints)    Home Living                      Prior Function            PT Goals (current goals can now be found in the care plan section) Progress towards PT goals: Not progressing toward goals - comment (due to weakness, lethargy)    Frequency  Min 3X/week    PT Plan Discharge plan needs to be updated    Co-evaluation             End of Session Equipment Utilized During Treatment: Gait belt Activity Tolerance: Other (comment) (limited due to transport for  pt to go to ultrasound ) Patient left: in bed;with nursing/sitter in room;with family/visitor present     Time: 0947-0962 PT Time Calculation (min): 23 min  Charges:  $Gait Training: 8-22 mins $Therapeutic Activity: 8-22 mins                    G Codes:      Brent Little,CYNDI 03/25/14, 4:51 PM Brent Little, [place] 2014-03-25

## 2014-03-09 NOTE — Discharge Summary (Addendum)
Physician Discharge Summary  Brent Little HYW:[id number] DOB: Feb 24, 1941 DOA: 02/15/2014  PCP: [name], MD  Admit date: 02/15/2014 Discharge date: 03/09/2014  Time spent: 30  minutes  Recommendations for Outpatient Follow-up:  1. Follow up with urology as recommended 2. Follow up with surgery as recommended 3. Follow up with physical therapy.   Discharge Diagnoses:  Principal Problem:   Essential hypertension Active Problems:   Liver abscess   BPH (benign prostatic hyperplasia)   Colonic mass   Acute renal failure   Hematuria  Colon cancer Malnutrition  Discharge Condition: improved  Diet recommendation: low sodium diet.   Filed Weights   03/08/14 4854 03/08/14 1648 03/09/14 0624  Weight: 81.6 kg (179 lb 14.3 oz) 81 kg (178 lb 9.2 oz) 78.926 kg (174 lb)    History of present illness:  Brent Little is a 73 y.o. male, With H/O HTN, Kidney stone, who initially presented to [facility] with fever, dull right upper quadrant pain and a dry cough. He was diagnosed there with a possible community-acquired pneumonia, was placed on Levaquin, he had marginal improvement. During his fever workup he had a right upper quadrant ultrasound which showed some nonspecific changes . He was then discharged home with outpatient MRI. MRI done in the outpatient setting showed a septated cystic lesion in the right hepatic lobe concerning for abscess versus metastatic necrotic mass. MRI also showed an ascending colon circumferential mass. He presented to [facility] with fever, chills, decreased appetite and lethargy. He was followed by [facility]/GI and IR. He has been on broad spectrum Abx, finally underwent Liver biopsy 10/12. And had a drain put it. His creatinine started creeping up on 10/16. Vancomycin trough came back elevated. Stopped vancomycin and Nephrology was consulted. His blood cultures and cultures from the liver abscess have all come back negative and zosyn was discontinued on 10/19 after  completing 10 days of IV antibiotics. Then he had minimal rectal bleeding. GI was reconsulted and he underwent colonoscopy 10/23 which revealed colon cancer. His renal function is slowly improving. Plan is for patient's nutritional status to improve prior to undergoing surgery. Tried putting the panda tube and he refused. Initially plan was to take the patient home with home PT, but wife erports that she wont be able to handle him at home and wants to discuss the snf options at this time.    Hospital Course:  Colon Cancer/Hematochezia  GI consulted for colonoscopy. They initially recommended outpatient colonoscopy when he recovers from the liver issue. However he developed mild rectal bleeding. Colonoscopy done on 10/23 which revealed colon cancer. CEA mildly elevated at 9. No further blood in stool. Surgery was consulted. It is felt that his impaired renal function could get worse if surgery is done now. Plus patient's nutritional status needs to be better as well. Patient might have to go to SNF/Rehab for few weeks to improve his functional status. Calorie count in progress. His oral intake continues to be poor. General surgery considering alterative modality of nutrition. Tried panda tube , which he fought against and refused to try again. Not a great candidate for PICC line and TNA placement.  Acute renal failure  Probably secondary to vancomycin toxicity vs ace inhibition vs NSAID use vs volume depletion. [place] was negative for hydronephrosis. UA is negative. Nephrology following. Was given HCO3 infusion. Renal function is improving slowly though not back to baseline.  Liver abscess vs necrotic mets  He underwent US guided biopsy 10/12; yielded purulent appearing material. 42F  drain was placed under US guidance. Culture and gram stain were sent. No organisms seen. Blood cultures are negative. Cytology was negative for malignant cells. He completed 10 days of IV antibiotics. Vancomycin was stopped on 10/16  after the trough came back elevated. A repeat CT abdomen and pelvis without contrast showed decrease in the size of the collection. Drain removed 10/21.  Nausea/Poor Appetite/Severe Protein Calorie malnutrition  Likely due to acute illness, ARF. US shows gallstones but no acute inflammation. LFT's normal. Diet advanced. Calorie count.  Essential HTN  BP fluctuates but stable. ACE on hold  Iron Deficiency Anemia  Hgb noted to be lower but stable. Monitor for now.  Dysuria/Hematuria  He had transient hematuria. Now resolved. He is noted to have renal cysts and enlarged prostate on [place]. These could have contributed. Discussed with Dr. Alinda Money with Urology on 10/25 and he recommends OP evaluation for same especially as hematuria seems to have been transient.  Confusion/Acute Encephalopathy  Initially thought to be secondary to narcotic pain medications. These were stopped. MRI brian was done due to persistent symptoms. MRI was negative. Stable with occasional periods of confusion. Patient likely has underlying cognitive impairment. Also stopped Benadryl which could have contributed as well.  Sinus Bradycardia  Asymptomatic. Not on rate limiting medications.  Hematuria:  And post void residual volume of 425ml. Repeat US renal ordered ,. Urology consulted for recommendations. Foley placed and flomax started .   Anemia: Probably secondary to anemia of chronic disease and some bleeding fromt he colon cancer. His H&h appears stable. Continue to monitor.     Procedures: IR placed drain in liver lesion Colonoscopy 10/23  Synchronous colon cancers in the ascending and descending colon     Consultations:  Gastroenterology  SURGERY  Ir    Discharge Exam: Filed Vitals:   03/09/14 0624  BP: 149/64  Pulse: 72  Temp: 99.1 F (37.3 C)  Resp: 18    General: ALERT confused, but not agitated, pleasant, .  Cardiovascular: s1s2 Respiratory: ctab  Discharge Instructions You were cared for  by a hospitalist during your hospital stay. If you have any questions about your discharge medications or the care you received while you were in the hospital after you are discharged, you can call the unit and asked to speak with the hospitalist on call if the hospitalist that took care of you is not available. Once you are discharged, your primary care physician will handle any further medical issues. Please note that NO REFILLS for any discharge medications will be authorized once you are discharged, as it is imperative that you return to your primary care physician (or establish a relationship with a primary care physician if you do not have one) for your aftercare needs so that they can reassess your need for medications and monitor your lab values.  Discharge Instructions   Discharge instructions    Complete by:  As directed   FOLLOW UP RENAL, SURGERY AS RECOMMENDED.          Current Discharge Medication List    START taking these medications   Details  finasteride (PROSCAR) 5 MG tablet Take 1 tablet (5 mg total) by mouth daily. Qty: 30 tablet    tamsulosin (FLOMAX) 0.4 MG CAPS capsule Take 1 capsule (0.4 mg total) by mouth daily after supper. Qty: 30 capsule    traZODone (DESYREL) 25 mg TABS tablet Take 0.5 tablets (25 mg total) by mouth at bedtime as needed for sleep. Qty: 30 tablet, Refills: 1  STOP taking these medications     hydrochlorothiazide (HYDRODIURIL) 25 MG tablet      potassium chloride SA (K-DUR,KLOR-CON) 20 MEQ tablet      ramipril (ALTACE) 10 MG capsule      terazosin (HYTRIN) 5 MG capsule        No Known Allergies Follow-up Information   Follow up with BLACKMAN,DOUGLAS A, MD. Schedule an appointment as soon as possible for a visit in 2 weeks.   Specialty:  General Surgery   Contact information:   [address] [place] [address] [phone]        The results of significant diagnostics from this hospitalization (including  imaging, microbiology, ancillary and laboratory) are listed below for reference.    Significant Diagnostic Studies: Ct Abdomen Pelvis Wo Contrast  02/25/2014   CLINICAL DATA:  73 year old male admission with liver abscess.  EXAM: CT ABDOMEN AND PELVIS WITHOUT CONTRAST  TECHNIQUE: Multidetector CT imaging of the abdomen and pelvis was performed following the standard protocol without IV contrast.  COMPARISON:  Abdominal ultrasound 02/19/2014, MRI liver protocol 02/13/2014  FINDINGS: Lower chest:  Unremarkable appearance of the subcutaneous tissues of the right chest wall.  Heart size within normal limits. No pericardial fluid/ thickening. Calcifications in the left main, left anterior descending, circumflex, right coronary arteries.  Unremarkable appearance the distal esophagus.  Moderate right pleural effusion.  Atelectasis of the bilateral lung bases. Respiratory motion somewhat limits evaluation of the lungs for small nodules.  Abdomen/ pelvis:  Postsurgical changes of percutaneous drain placement into segment 6/right liver lobe abscess. Pigtail remains positioned within low-density region in the area of the prior abscess. Focal hypodensity in this region measures approximately 5.0- 5.5 cm. The overall size does appear smaller than the prior MRI, though comparison across modalities is somewhat limited by the tissue resolution on the CT.  Remainder of the liver parenchyma unremarkable.  Unremarkable appearance of the spleen. Unremarkable appearance of bilateral adrenal glands.  No evidence of hydronephrosis. Bilateral low-density cystic lesions of the kidneys, characterized on prior MRI to represent benign cysts.  Enteric contrast extends through the distal small bowel and colon. No evidence of abnormally distended small bowel or colon.  Circumferential soft tissue within the ascending colon with "apple core" configuration, as was described on prior MRI.  There are a few small mesenteric lymph nodes, none of  which are enlarged by CT size criteria. Specifically, ileocolic lymph node measures 7 mm with rounded configuration on image 35 of series 2.  Unremarkable appearance of the urinary bladder. Transverse diameter of the prostate measures 5.4 cm.  Small amount of low-density free fluid layered within the rectovesical space.  Atherosclerotic calcifications of the abdominal aorta extending into the iliofemoral system.  Multilevel degenerative disc disease of the lower thoracic and lumbar spine. No bony canal stenosis. No acute displaced fracture.  IMPRESSION: Interval placement of percutaneous pigtail drainage collapse there into segment 6 liver fluid collection compared to prior MRI. Across modalities, there does appear to be decreased size of this collection, though there is likely some persisting fluid/ edema.  Re- demonstration of circumferential soft tissue within the ascending colon, concerning for colon carcinoma. There is a suspicious, rounded lymph node within the ileocolic nodal station, though is not enlarged by CT size criteria.  Atherosclerosis with evidence of left main in 3 vessel coronary artery disease.  Moderate right-sided pleural effusion with atelectasis.  Signed,  Dulcy Fanny. Earleen Newport, DO  [facility]  [facility]  Electronically Signed   By: Corrie Mckusick D.O.   On: 02/25/2014 19:20   Dg Chest 2 View  02/15/2014   CLINICAL DATA:  Fever and fatigue, and possible liver abscess ; also body aches and urinary frequency and right lower quadrant abdominal pain for 1 month.  EXAM: CHEST  2 VIEW  COMPARISON:  PA and lateral chest x-ray of February 09, 2014 and February 06, 2014.  FINDINGS: The lungs are adequately inflated. The interstitial markings are mildly increased. There is a new tiny left pleural effusion. The cardiac silhouette is normal in size. The pulmonary vascularity is mildly prominent. There is persistent increased density in the retrocardiac region  likely on the right which may reflect developing pneumonia. There is no pneumothorax. The mediastinum is normal in width. The bony thorax is unremarkable.  IMPRESSION: Mildly increased interstitial markings diffusely, and focally increased lung markings likely in the right lower lobe are noted. One cannot exclude early pneumonia. A small left pleural effusion has developed.   Electronically Signed   By: David  Martinique   On: 02/15/2014 13:02   Mr Brain Wo Contrast  02/28/2014   CLINICAL DATA:  Initial evaluation for acute confusion.  EXAM: MRI HEAD WITHOUT CONTRAST  TECHNIQUE: Multiplanar, multiecho pulse sequences of the brain and surrounding structures were obtained without intravenous contrast.  COMPARISON:  Prior CT from 02/07/2014  FINDINGS: Mild diffuse prominence of the CSF containing spaces is compatible with generalized cerebral atrophy. Minimal patchy T2/FLAIR hyperintensity present within the periventricular and deep white matter both cerebral hemispheres, likely related to very mild chronic microvascular ischemic changes.  No mass lesion, midline shift, or extra-axial fluid collection. Ventricles are normal in size without evidence of hydrocephalus.  No diffusion-weighted signal abnormality is identified to suggest acute intracranial infarct. Gray-white matter differentiation is maintained. Normal flow voids are seen within the intracranial vasculature. No intracranial hemorrhage identified.  The cervicomedullary junction is normal. Pituitary gland is within normal limits. Pituitary stalk is midline. The globes and optic nerves demonstrate a normal appearance with normal signal intensity. The  The bone marrow signal intensity is normal. Calvarium is intact. Visualized upper cervical spine is within normal limits.  Scalp soft tissues are unremarkable.  Paranasal sinuses are clear.  No mastoid effusion.  IMPRESSION: 1. No acute intracranial infarct or other abnormality identified. 2. Mild age-related  atrophy with chronic microvascular ischemic disease.   Electronically Signed   By: Jeannine Boga M.D.   On: 02/28/2014 05:02   [place] Abscess Drain  02/20/2014   CLINICAL DATA:  73 year old male with complex fluid collection/ mass in the posterior right liver. Differential considerations include hepatic abscess and necrotic hepatic metastatic lesion. Ultrasound-guided biopsy/aspiration versus drain placement is warranted.  EXAM: ULTRASOUND GUIDED ABSCESS DRAINAGE  Date: 02/20/2014  PROCEDURE: 1. Ultrasound-guided aspiration of complex fluid collection in the right hepatic lobe 2. Ultrasound-guided placement of a 12 French percutaneous drainage catheter Interventional Radiologist:  Criselda Peaches, MD  ANESTHESIA/SEDATION: Moderate (conscious) sedation was used. 1 mg Versed, 25 mcg Fentanyl were administered intravenously. The patient's vital signs were monitored continuously by radiology nursing throughout the procedure.  Sedation Time: 25 minutes  The patient is currently on intravenous vancomycin and Zosyn. The the last Zosyn infusion was completed within the hr prior to the procedure.  TECHNIQUE: Informed consent was obtained from the patient following explanation of the procedure, risks, benefits and alternatives. The patient understands, agrees and consents for the procedure. All questions were addressed. A time out was performed.  The right upper quadrant was interrogated with ultrasound. There is a large complex fluid collection in the posterior aspect of the right hemi liver. No definite solid mural nodularity to suggest a necrotic mass. A suitable skin entry site was selected and marked. The region was then sterilely prepped and draped in the standard fashion using Betadine skin prep.  Local anesthesia was attained by infiltration with 1% lidocaine. Under real-time sonographic guidance, an 18 gauge trocar needle was advanced through a tract of normal hepatic parenchyma and into the complex fluid  collection. Aspiration was performed revealing thick purulent material. The appearance of the aspirated material is most suggestive of hepatic abscess. Therefore, a 0.035 Amplatz wire was advanced through the trocar needle and coiled within the complex fluid collection. The tract was then dilated to 17 [place] and a Cook 12 [place] multipurpose drainage catheter advanced over the wire and formed within the fluid collection. A total of 115 mL of thick, purulent material was successfully aspirated. A sample was sent for culture and a second sample sent for cytology.  The drainage catheter was gently flash thin connected to JP bulb suction. The catheter was then secured to the skin with 0 Prolene suture and an adhesive fixation device. Post aspiration ultrasound imaging demonstrates near-total resolution of the fluid collection.  The patient tolerated the procedure well.  COMPLICATIONS: None immediate  IMPRESSION: 1. Ultrasound evaluation demonstrates a complex cystic collection in the posterior right hemi liver. No mural nodularity or sonographic features to suggest the presence of metastatic tissue. 2. Initial ultrasound guided aspiration revealed thick, purulent material further strengthening the diagnosis of hepatic abscess. 3. Placement of a 25 French percutaneous drainage catheter with aspiration of 115 mL of thick, purulent material. Samples were sent both for cytology and culture.  PLAN: Maintain tube to JP bulb suction and flush 3 times daily with a small volume (5-10 mL) sterile saline. Follow cultures and titrate antibiotics accordingly. Prior to removal of the tube, recommend repeat CT scan of the abdomen with contrast material to assess for resolution of the hepatic abscess. Patient will be followed in the Interventional Radiology [facility].  Signed,  Criselda Peaches, MD  [facility]  [facility]   Electronically Signed   By: Jacqulynn Cadet M.D.   On:  02/20/2014 08:41   US Renal  02/24/2014   CLINICAL DATA:  73 year old male with acute renal failure. Hypertension.  EXAM: RENAL/URINARY TRACT ULTRASOUND COMPLETE  COMPARISON:  02/13/2014 abdominal MR  FINDINGS: Right Kidney:  Length: 12.2 cm.  No hydronephrosis.  Cysts measure up to 2 cm.  Left Kidney:  Length: 13.6 cm.  No hydronephrosis.  Cysts measure up to 4.2 cm.  Bladder:  Enlarged prostate gland. Clinical and laboratory correlation recommended. No gross urinary bladder abnormality detected.  IMPRESSION: No hydronephrosis.  Bilateral renal cysts larger on the left.  Enlarged prostate gland. Clinical and laboratory correlation recommended   Electronically Signed   By: Chauncey Cruel M.D.   On: 02/24/2014 03:20   US Abdomen Limited Ruq  03/02/2014   CLINICAL DATA:  73 year old male with acute nausea. Current history of right hepatic lobe abscess drainage. Initial encounter.  EXAM: US ABDOMEN LIMITED - RIGHT UPPER QUADRANT  COMPARISON:  CT Abdomen and Pelvis 02/25/2014.  FINDINGS: Gallbladder:  Two shadowing echogenic gallstones identified, the largest is 9 mm in diameter (image 19). Wall thickness remains normal at 2 mm. No sonographic Murphy sign elicited.  Common bile duct:  Diameter: 3  mm, normal  Liver:  Heterogeneous echotexture in the right lobe, the site of the right pigtail drainage catheter. No measurable liver lesion. No intrahepatic biliary ductal dilatation.  Other findings:  Right pleural effusion.  IMPRESSION: 1. Heterogeneous echotexture in the right lobe at the site of recent drainage catheter. No measurable liver lesion. 2. Right pleural effusion. 3. Cholelithiasis without sonographic evidence of acute cholecystitis.   Electronically Signed   By: Lars Pinks M.D.   On: 03/02/2014 00:26    Microbiology: Recent Results (from the past 240 hour(s))  URINE CULTURE     Status: None   Collection Time    03/05/14 12:10 PM      Result Value Ref Range Status   Specimen Description URINE,  RANDOM   Final   Special Requests NONE   Final   Culture  Setup Time     Final   Value: 03/05/2014 21:04     Performed at [place]     Final   Value: >=100,000 COLONIES/ML     Performed at [facility]   Culture     Final   Value: ENTEROBACTER CLOACAE     Performed at [facility]   Report Status 03/09/2014 FINAL   Final   Organism ID, Bacteria ENTEROBACTER CLOACAE   Final     Labs: Basic Metabolic Panel:  Recent Labs Lab 03/05/14 0320 03/06/14 0521 03/07/14 1021 03/08/14 0324 03/09/14 0230  NA 141 141 138 142 139  K 3.2* 3.6* 3.6* 4.2 3.8  CL 102 104 102 105 102  CO2 25 25 23 24 24   GLUCOSE 111* 116* 103* 118* 102*  BUN 23 20 19 22 23   CREATININE 3.78* 3.63* 3.44* 3.49* 3.34*  CALCIUM 8.2* 8.2* 8.3* 8.9 8.9  PHOS  --  3.0 3.2  --  4.0   Liver Function Tests:  Recent Labs Lab 03/06/14 0521 03/07/14 1021 03/09/14 0230  ALBUMIN 1.9* 1.9* 2.1*   No results found for this basename: LIPASE, AMYLASE,  in the last 168 hours No results found for this basename: AMMONIA,  in the last 168 hours CBC:  Recent Labs Lab 03/03/14 0252 03/04/14 0407 03/05/14 0320 03/07/14 1020 03/09/14 0230  WBC 10.6* 9.5 9.4 9.2 10.9*  NEUTROABS  --   --   --  5.0  --   HGB 8.1* 8.2* 8.5* 7.4* 7.3*  HCT 24.6* 25.4* 26.3* 22.7* 22.9*  MCV 79.1 78.9 79.5 80.8 78.7  PLT 292 312 270 188 166   Cardiac Enzymes: No results found for this basename: CKTOTAL, CKMB, CKMBINDEX, TROPONINI,  in the last 168 hours BNP: BNP (last 3 results) No results found for this basename: PROBNP,  in the last 8760 hours CBG: No results found for this basename: GLUCAP,  in the last 168 hours     Signed:  Kele Barthelemy  [facility] 03/09/2014, 2:54 PM

## 2014-03-09 NOTE — Progress Notes (Signed)
Patient ID: Brent Little, male   DOB: Oct 31, 1940, 73 y.o.   MRN: [id number] 7 Days Post-Op  Subjective: Still confused.  No abdominal pain.  Ate half a pancake this morning for breakfast.  Objective: Vital signs in last 24 hours: Temp:  [97.7 F (36.5 C)-99.1 F (37.3 C)] 99.1 F (37.3 C) (10/30 0624) Pulse Rate:  [72-84] 72 (10/30 0624) Resp:  [18-20] 18 (10/30 0624) BP: (118-156)/(64-98) 149/64 mmHg (10/30 0624) SpO2:  [94 %-98 %] 96 % (10/30 0624) Weight:  [174 lb (78.926 kg)-178 lb 9.2 oz (81 kg)] 174 lb (78.926 kg) (10/30 0624) Last BM Date: 03/09/14  Intake/Output from previous day: 10/29 0701 - 10/30 0700 In: 480.5 [P.O.:480.5] Out: 3050 [Urine:3050] Intake/Output this shift:    PE: Abd: soft, NT, ND, +BS  Lab Results:   Recent Labs  03/07/14 1020 03/09/14 0230  WBC 9.2 10.9*  HGB 7.4* 7.3*  HCT 22.7* 22.9*  PLT 188 166   BMET  Recent Labs  03/08/14 0324 03/09/14 0230  NA 142 139  K 4.2 3.8  CL 105 102  CO2 24 24  GLUCOSE 118* 102*  BUN 22 23  CREATININE 3.49* 3.34*  CALCIUM 8.9 8.9   PT/INR No results found for this basename: LABPROT, INR,  in the last 72 hours CMP     Component Value Date/Time   NA 139 03/09/2014 0230   K 3.8 03/09/2014 0230   CL 102 03/09/2014 0230   CO2 24 03/09/2014 0230   GLUCOSE 102* 03/09/2014 0230   BUN 23 03/09/2014 0230   CREATININE 3.34* 03/09/2014 0230   CALCIUM 8.9 03/09/2014 0230   PROT 5.5* 03/02/2014 0345   ALBUMIN 2.1* 03/09/2014 0230   AST 20 03/02/2014 0345   ALT 17 03/02/2014 0345   ALKPHOS 125* 03/02/2014 0345   BILITOT 0.4 03/02/2014 0345   GFRNONAA 17* 03/09/2014 0230   GFRAA 19* 03/09/2014 0230   Lipase  No results found for this basename: lipase       Studies/Results: No results found.  Anti-infectives: Anti-infectives   Start     Dose/Rate Route Frequency Ordered Stop   02/26/14 1600  piperacillin-tazobactam (ZOSYN) IVPB 2.25 g  Status:  Discontinued     2.25 g 100 mL/hr over 30  Minutes Intravenous 3 times per day 02/26/14 1222 02/26/14 1849   02/18/14 1800  vancomycin (VANCOCIN) IVPB 750 mg/150 ml premix  Status:  Discontinued     750 mg 150 mL/hr over 60 Minutes Intravenous Every 12 hours 02/18/14 1726 02/24/14 1650   02/16/14 0000  piperacillin-tazobactam (ZOSYN) IVPB 3.375 g  Status:  Discontinued     3.375 g 12.5 mL/hr over 240 Minutes Intravenous 3 times per day 02/15/14 1516 02/26/14 1222   02/15/14 1600  vancomycin (VANCOCIN) IVPB 1000 mg/200 mL premix  Status:  Discontinued     1,000 mg 200 mL/hr over 60 Minutes Intravenous Every 12 hours 02/15/14 1516 02/18/14 1726   02/15/14 1600  piperacillin-tazobactam (ZOSYN) IVPB 3.375 g     3.375 g 100 mL/hr over 30 Minutes Intravenous  Once 02/15/14 1516 02/15/14 1900       Assessment/Plan   1. 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 SPCM  3. Hypoalbuminemia  4. ARF, nonoliguric  5. Anemia  6. Confusion  Plan: 1. Patient unable to get PANDA placed.  His option now is to just try and eat on his  own with supplements from [employer]. 2. He is stable for DC to home or SNF depending on the family's request.  3. Will have him follow up with Dr. Ninfa Linden in 2-3 weeks to determine timing of surgery.  He will need a prealbumin checked prior to that visit   LOS: 22 days    Jericho Alcorn E 03/09/2014, 9:54 AM Pager: [phone]

## 2014-03-09 NOTE — Progress Notes (Signed)
Agree with above.  Still not sure what procedure will be done with the synchronous lesions.    Brent Little. Dahlia Bailiff, MD, [place] [phone] [facility]

## 2014-03-09 NOTE — Progress Notes (Signed)
[facility] PROGRESS NOTE  Coston Mandato SWN:[id number] DOB: 1940-07-01 DOA: 02/15/2014  PCP: [name], MD  Brief Narrative: Brent Little is a 73 y.o. male, With H/O HTN, Kidney stone, who initially presented to [place] with fever, dull right upper quadrant pain and a dry cough. He was diagnosed there with a possible community-acquired pneumonia, was placed on Levaquin, he had marginal improvement. During his fever workup he had a right upper quadrant ultrasound which showed some nonspecific changes . He was then discharged home with outpatient MRI. MRI done in the outpatient setting showed a septated cystic lesion in the right hepatic lobe concerning for abscess versus metastatic necrotic mass. MRI also showed an ascending colon circumferential mass. He presented to [facility] with fever, chills, decreased appetite and lethargy. He was followed by [facility]/GI and IR. He has been on broad spectrum Abx, finally underwent Liver biopsy 10/12. And had a drain put it. His creatinine started creeping up on 10/16. Vancomycin trough came back elevated. Stopped vancomycin and Nephrology was consulted. His blood cultures and cultures from the liver abscess have all come back negative and zosyn was discontinued on 10/19 after completing 10 days of IV antibiotics. Then he had minimal rectal bleeding. GI was reconsulted and he underwent colonoscopy 10/23 which revealed colon cancer. His renal function is slowly improving. Plan is for patient's nutritional status to improve prior to undergoing surgery. Tried putting the panda tube and he refused. Initially plan was to take the patient home with home PT, but wife erports that she wont be able to handle him at home and wants to discuss the snf options at this time.   Assessment/Plan:  Colon Cancer/Hematochezia GI consulted for colonoscopy. They initially recommended outpatient colonoscopy when he recovers from the liver issue. However he developed mild rectal  bleeding. Colonoscopy done on 10/23 which revealed colon cancer. CEA mildly elevated at 9. No further blood in stool. Surgery was consulted. It is felt that his impaired renal function could get worse if surgery is done now. Plus patient's nutritional status needs to be better as well. Patient might have to go to SNF/Rehab for few weeks to improve his functional status. Calorie count in progress. His oral intake continues to be poor. General surgery considering alterative modality of nutrition. Tried panda tube , which he fought against and refused to try again. Not a great candidate for PICC line and TNA placement.   Acute renal failure Probably secondary to vancomycin toxicity vs ace inhibition vs NSAID use vs volume depletion. [place] was negative for hydronephrosis. UA is negative. Nephrology following. Was given HCO3 infusion. Renal function is improving slowly though not back to baseline.   Liver abscess vs necrotic mets He underwent US guided biopsy 10/12; yielded purulent appearing material. 84F drain was placed under US guidance. Culture and gram stain were sent. No organisms seen. Blood cultures are  negative. Cytology was negative for malignant cells. He completed 10 days of IV antibiotics. Vancomycin was stopped on 10/16 after the trough came back elevated. A repeat CT abdomen and pelvis without contrast showed decrease in the size of the collection. Drain removed 10/21.   Nausea/Poor Appetite/Severe Protein Calorie malnutrition Likely due to acute illness, ARF. US shows gallstones but no acute inflammation. LFT's normal. Diet advanced. Calorie count.   Essential HTN BP fluctuates but stable. ACE on hold  Iron Deficiency Anemia Hgb noted to be lower but stable. Monitor for now.   Dysuria/Hematuria He had transient hematuria. Now resolved. He is  noted to have renal cysts and enlarged prostate on [place]. These could have contributed. Discussed with Dr. Alinda Money with Urology on 10/25 and he  recommends OP evaluation for same especially as hematuria seems to have been transient.   Confusion/Acute Encephalopathy Initially thought to be secondary to narcotic pain medications. These were stopped. MRI brian was done due to persistent symptoms. MRI was negative. Stable with occasional periods of confusion. Patient likely has underlying cognitive impairment. Also stopped Benadryl which could have contributed as well.  Sinus Bradycardia Asymptomatic. Not on rate limiting medications.  Hematuria:  And post void residual volume of 440ml. Repeat US renal ordered and UA . Urology consulted for recommendations. Foley placed and flomax started .   DVT proph: SCD's Code Status: Full Code Family Communication: Discussed with patient and wife  at bedside Disposition Plan: SNF   Procedures IR placed drain in liver lesion.  Colonoscopy 10/23 Synchronous colon cancers in the ascending and descending colon    Consultants:  GI  CCS  IR  Subjective: He was restless all night, had to be put on mittens. This morning, though he is confused , he is not agitated .   Objective: Filed Vitals:   03/09/14 0624  BP: 149/64  Pulse: 72  Temp: 99.1 F (37.3 C)  Resp: 18    Intake/Output Summary (Last 24 hours) at 03/09/14 1054 Last data filed at 03/09/14 0703  Gross per 24 hour  Intake  340.5 ml  Output   2750 ml  Net -2409.5 ml   Filed Weights   03/08/14 0632 03/08/14 1648 03/09/14 0624  Weight: 81.6 kg (179 lb 14.3 oz) 81 kg (178 lb 9.2 oz) 78.926 kg (174 lb)    Exam:   General:  alert and conversant, but confused.  Cardiovascular: S1S2/RRR  Respiratory: CTA bilaterally  Abdomen: soft,, no tendernessBS present.  Musculoskeletal: no edema  Data Reviewed: Basic Metabolic Panel:  Recent Labs Lab 03/05/14 0320 03/06/14 0521 03/07/14 1021 03/08/14 0324 03/09/14 0230  NA 141 141 138 142 139  K 3.2* 3.6* 3.6* 4.2 3.8  CL 102 104 102 105 102  CO2 25 25 23 24 24    GLUCOSE 111* 116* 103* 118* 102*  BUN 23 20 19 22 23   CREATININE 3.78* 3.63* 3.44* 3.49* 3.34*  CALCIUM 8.2* 8.2* 8.3* 8.9 8.9  PHOS  --  3.0 3.2  --  4.0   Liver Function Tests:  Recent Labs Lab 03/06/14 0521 03/07/14 1021 03/09/14 0230  ALBUMIN 1.9* 1.9* 2.1*   CBC:  Recent Labs Lab 03/03/14 0252 03/04/14 0407 03/05/14 0320 03/07/14 1020 03/09/14 0230  WBC 10.6* 9.5 9.4 9.2 10.9*  NEUTROABS  --   --   --  5.0  --   HGB 8.1* 8.2* 8.5* 7.4* 7.3*  HCT 24.6* 25.4* 26.3* 22.7* 22.9*  MCV 79.1 78.9 79.5 80.8 78.7  PLT 292 312 270 188 166   Cardiac Enzymes: No results found for this basename: CKTOTAL, CKMB, CKMBINDEX, TROPONINI,  in the last 168 hours   Recent Results (from the past 240 hour(s))  URINE CULTURE     Status: None   Collection Time    03/05/14 12:10 PM      Result Value Ref Range Status   Specimen Description URINE, RANDOM   Final   Special Requests NONE   Final   Culture  Setup Time     Final   Value: 03/05/2014 21:04     Performed at [facility]  Final   Value: >=100,000 COLONIES/ML     Performed at [facility]   Culture     Final   Value: GRAM NEGATIVE RODS     Performed at [facility]   Report Status PENDING   Incomplete     Studies: No results found.  Scheduled Meds: . feeding supplement (ENSURE COMPLETE)  237 mL Oral TID BM  . finasteride  5 mg Oral Daily  . Influenza vac split quadrivalent PF  0.5 mL Intramuscular Tomorrow-1000  . pneumococcal 23 valent vaccine  0.5 mL Intramuscular Tomorrow-1000  . tamsulosin  0.4 mg Oral QPC supper   Continuous Infusions:   Antibiotics Given (last 72 hours)   None      Principal Problem:   Essential hypertension Active Problems:   Liver abscess   BPH (benign prostatic hyperplasia)   Colonic mass   Acute renal failure   Hematuria  Time spent: 38min   [facility]   [facility]  Pager [phone]  If 7PM-7AM, please contact  night-coverage at [email or web address], password [facility] 03/09/2014, 10:54 AM  LOS: 22 days

## 2014-03-09 NOTE — Progress Notes (Signed)
Report called to nurse Bernadene Person at [facility]. Awaiting transportation at this time.

## 2014-03-09 NOTE — Clinical Social Work Note (Signed)
Patient to be d/c'ed today to [facility].  Patient and family agreeable to plans will transport via ems RN to call report.  Patient's wife was very grateful about how helpful and caring everyone has been. Evette Cristal, MSW, [place]

## 2014-03-09 NOTE — Progress Notes (Signed)
Subjective: Restless night, required mittens, denies pain. Wife worried about being able to care for him at home but also not keen on the idea of a nursing home, still undecided.  Objective: Vital signs in last 24 hours: Temp:  [97.7 F (36.5 C)-99.1 F (37.3 C)] 99.1 F (37.3 C) (10/30 0624) Pulse Rate:  [72-84] 72 (10/30 0624) Resp:  [18-20] 18 (10/30 0624) BP: (118-156)/(64-98) 149/64 mmHg (10/30 0624) SpO2:  [94 %-98 %] 96 % (10/30 0624) Weight:  [174 lb (78.926 kg)-178 lb 9.2 oz (81 kg)] 174 lb (78.926 kg) (10/30 0624) Weight change: -1 lb 5.2 oz (-0.6 kg)  Intake/Output from previous day: 10/29 0701 - 10/30 0700 In: 480.5 [P.O.:480.5] Out: 3050 [Urine:3050]  Intake/Output this shift:    General appearance: cooperative and confused Resp: clear to auscultation bilaterally Cardio: S1, S2 normal and systolic murmur: holosystolic 2/6, blowing at apex GI: pos bs, liver down 4 cm, soft Extremities: extremities normal, atraumatic, no cyanosis or edema PVR 400cc  Lab Results:  Recent Labs  03/07/14 1020 03/09/14 0230  WBC 9.2 10.9*  HGB 7.4* 7.3*  HCT 22.7* 22.9*  PLT 188 166   BMET:   Recent Labs  03/08/14 0324 03/09/14 0230  NA 142 139  K 4.2 3.8  CL 105 102  CO2 24 24  GLUCOSE 118* 102*  BUN 22 23  CREATININE 3.49* 3.34*  CALCIUM 8.9 8.9   No results found for this basename: PTH,  in the last 72 hours Iron Studies: No results found for this basename: IRON, TIBC, TRANSFERRIN, FERRITIN,  in the last 72 hours  Studies/Results: No results found.  I have reviewed the patient's current medications.  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 Foley placed yesterday. GFR without change.  Baseline Cr 1.7-2 Component of obstruction contrib to ^ Cr.   2 Malnutrition - considering retrial of PANDA vs po alone, would recommend PEG for improved nutrition with less risk 3 Confusion - waxing and waning consistent with delirium,  frequent redirection and sitter when family not present 4 Probable colon Ca - deferring surgery until strengh/nutrition improved 5 Liver abscesses - resolved with drainage and antibiotics 6 Anemia - normocytic, likely 2/2 colon cancer 7 BPH - foley in place, urology to see outpatient for voiding trial 8 Dispo - We will sign off, f/u outpatient with Dr. Iona Beard   LOS: 89 days   Brent Little 03/09/2014,7:31 AM I have seen and examined this patient and agree with the plan of care seen, eval, examined, and disucussed with residents. .  Shekela Goodridge,[place] L 03/09/2014, 10:36 AM

## 2014-04-10 DEATH — deceased

## 2015-01-18 IMAGING — US US RENAL
1 series · 14 of 25 positions shown · non-contrast
Comparison: Non contrast CT Abdomen and Pelvis 02/25/2014 and
earlier.

CLINICAL DATA: 73-year-old male with acute renal failure and
microscopic hematuria. Initial encounter. Current history of liver
abscess status post percutaneous drainage.

EXAM:
RENAL/URINARY TRACT ULTRASOUND COMPLETE

[Series 1: us renal · 0.22mm/px · 14 of 35 slices shown]
[im 1/35]
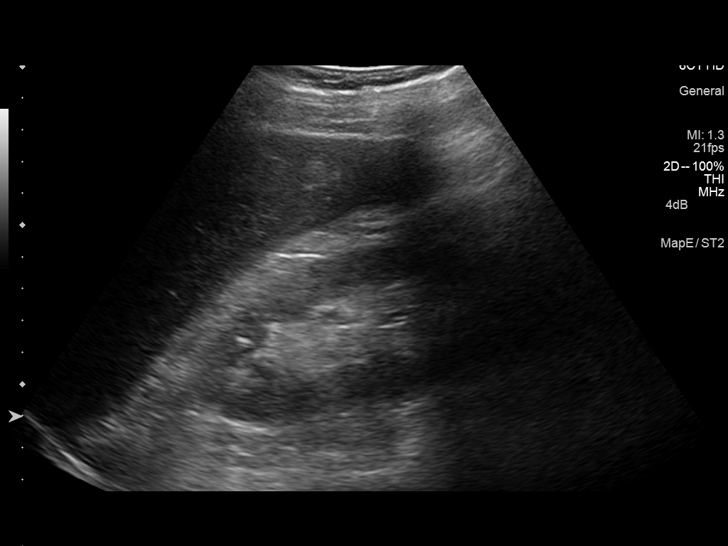
[im 3/35]
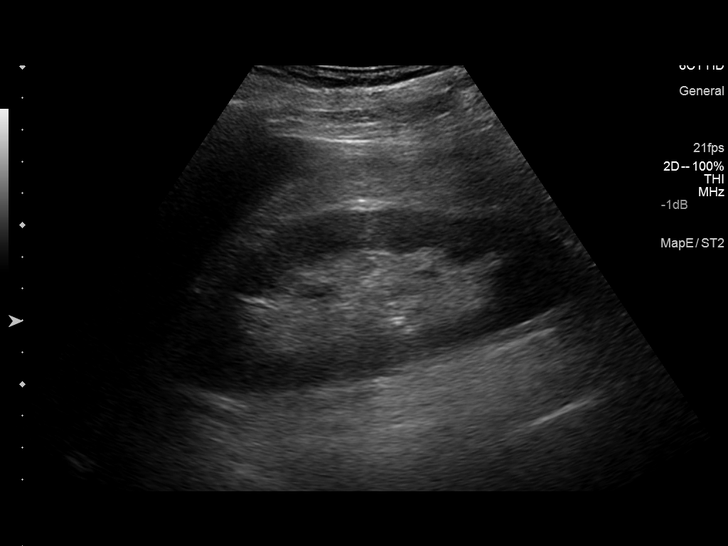
[im 6/35]
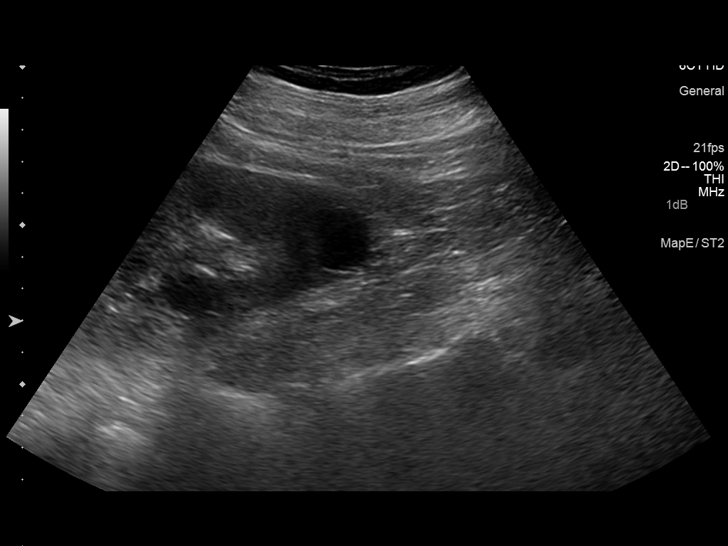
[im 9/35]
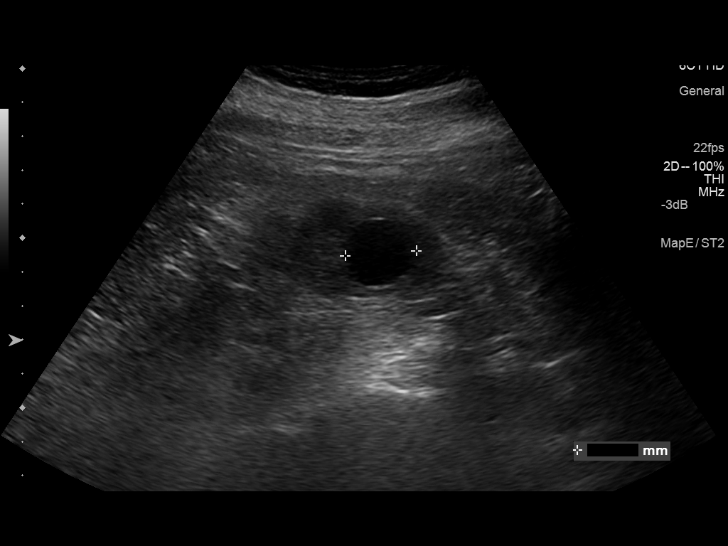
[im 12/35]
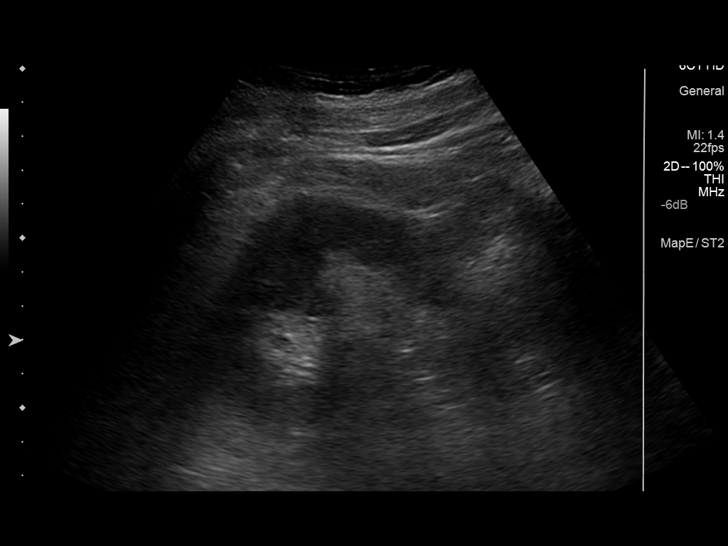
[im 13/35]
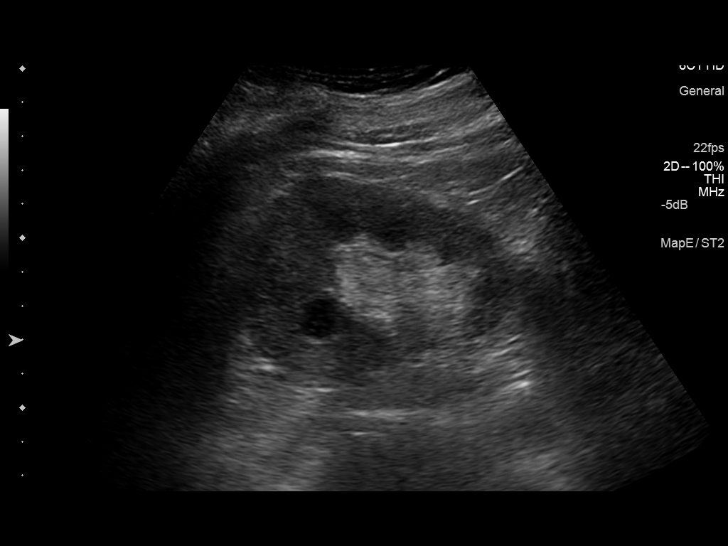
[im 16/35]
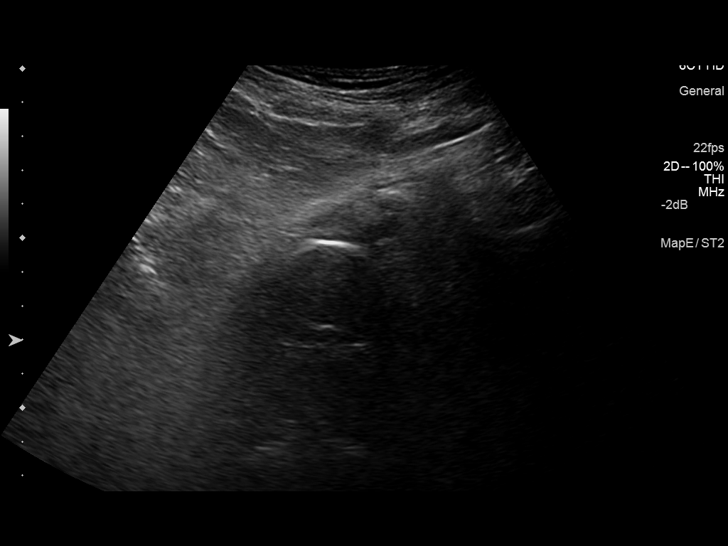
[im 19/35]
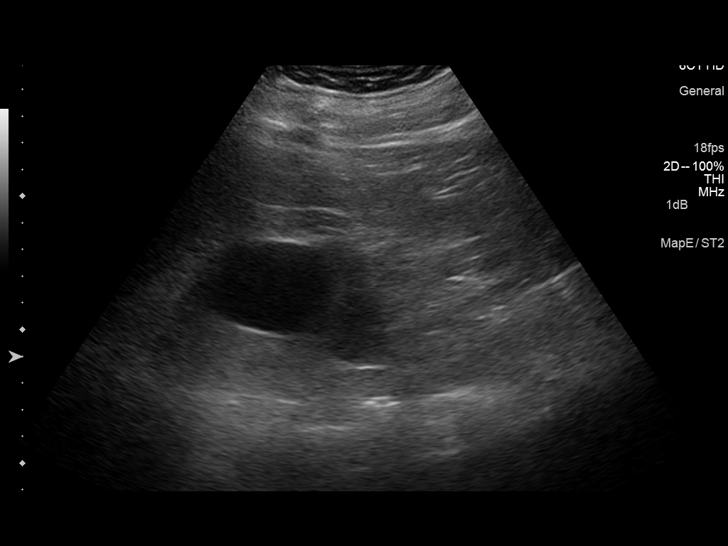
[im 22/35]
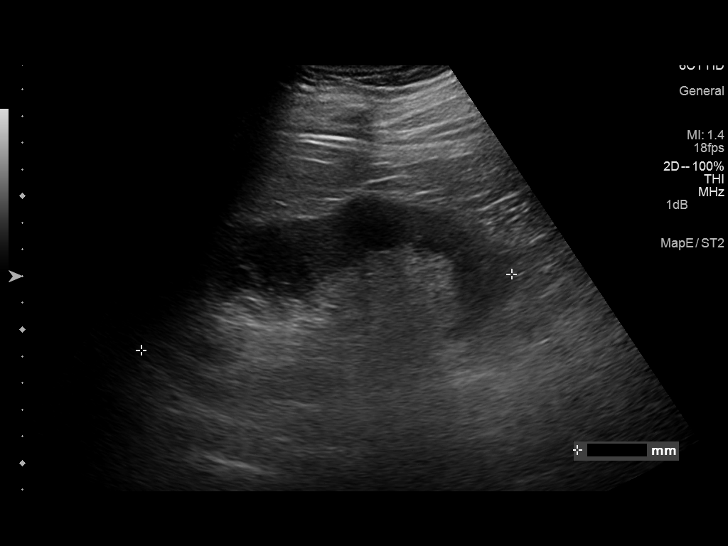
[im 23/35]
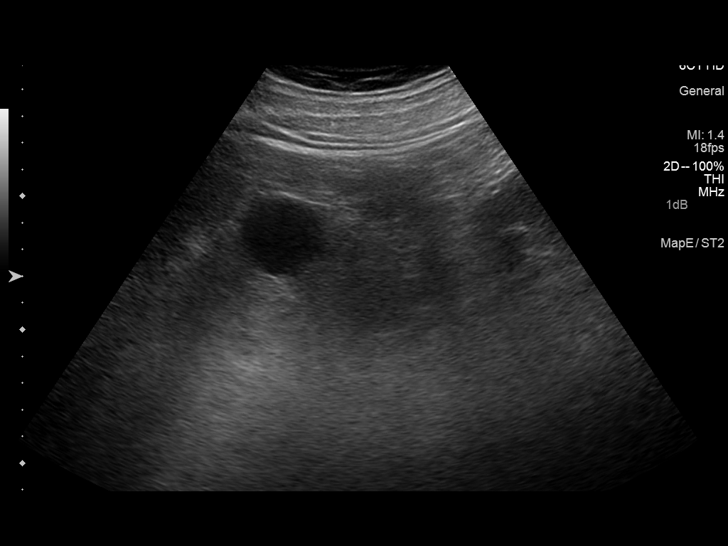
[im 26/35]
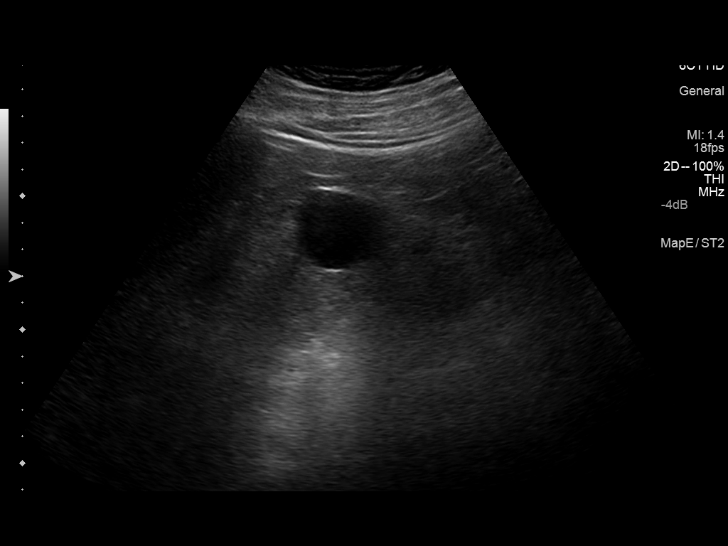
[im 29/35]
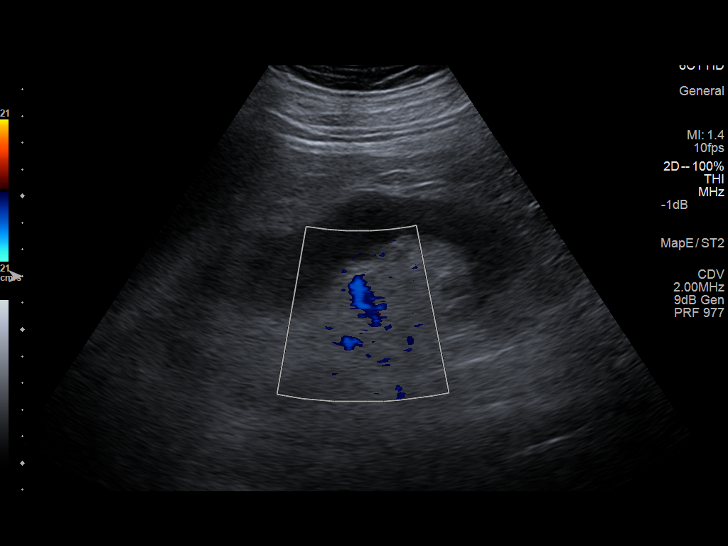
[im 32/35]
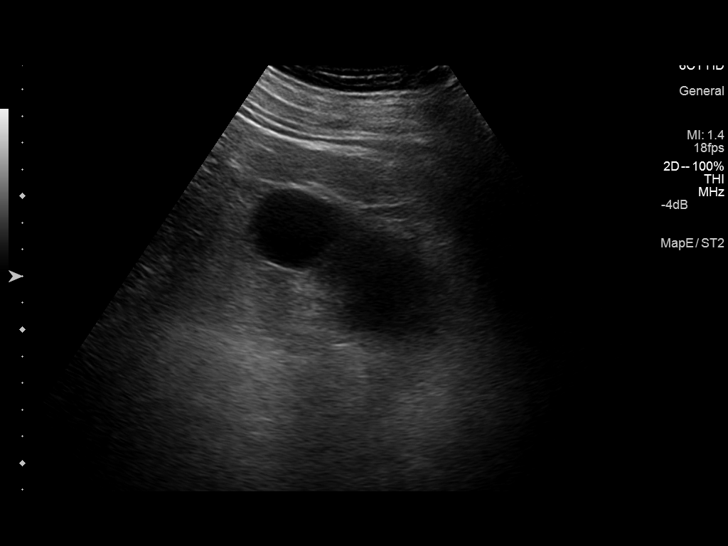
[im 35/35]
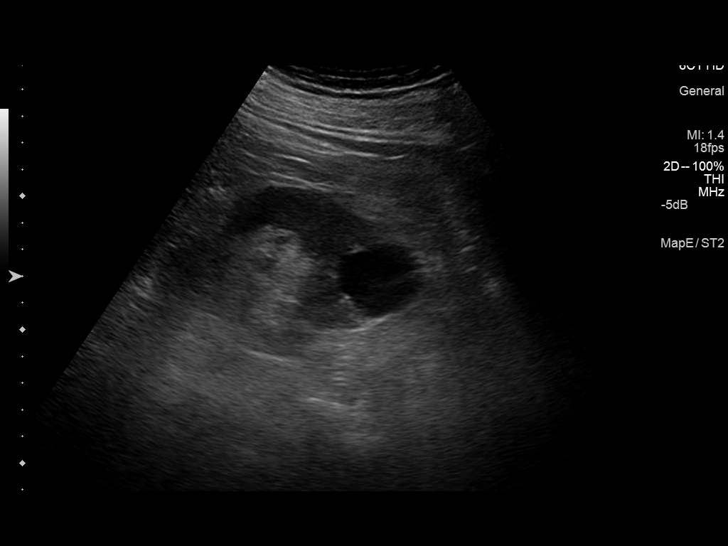

[14 of 25 positions shown; findings below may reference images not displayed]

FINDINGS: Right Kidney:

Length: 14.1 cm. Multiple simple appearing renal cysts, up to 19 mm.
Echogenicity within normal limits. No mass or hydronephrosis
visualized.

Left Kidney:

Length: 14.2 cm. Multiple simple renal cysts up to 4.6 cm diameter..
Echogenicity within normal limits. No mass or hydronephrosis
visualized.

Bladder:

Not visible, it decompressed via Foley catheter. The Foley balloon
is visible.
IMPRESSION: No hydronephrosis.  Simple renal cysts.
